# Patient Record
Sex: Male | Born: 1940 | Race: Black or African American | Hispanic: No | Marital: Married | Smoking: Former smoker
Health system: Southern US, Community
[De-identification: ages and names within clinical notes are randomized; demographics above are authoritative.]

## PROBLEM LIST (undated history)

## (undated) DIAGNOSIS — I340 Nonrheumatic mitral (valve) insufficiency: Secondary | ICD-10-CM

## (undated) DIAGNOSIS — Z9889 Other specified postprocedural states: Secondary | ICD-10-CM

## (undated) DIAGNOSIS — L910 Hypertrophic scar: Secondary | ICD-10-CM

## (undated) DIAGNOSIS — Z860101 Personal history of adenomatous and serrated colon polyps: Secondary | ICD-10-CM

## (undated) DIAGNOSIS — Z86010 Personal history of colonic polyps: Secondary | ICD-10-CM

## (undated) DIAGNOSIS — I10 Essential (primary) hypertension: Secondary | ICD-10-CM

## (undated) DIAGNOSIS — I519 Heart disease, unspecified: Secondary | ICD-10-CM

## (undated) DIAGNOSIS — E785 Hyperlipidemia, unspecified: Secondary | ICD-10-CM

## (undated) DIAGNOSIS — M169 Osteoarthritis of hip, unspecified: Secondary | ICD-10-CM

## (undated) DIAGNOSIS — I4819 Other persistent atrial fibrillation: Secondary | ICD-10-CM

## (undated) DIAGNOSIS — I428 Other cardiomyopathies: Secondary | ICD-10-CM

## (undated) HISTORY — DX: Heart disease, unspecified: I51.9

## (undated) HISTORY — DX: Other specified postprocedural states: Z98.890

## (undated) HISTORY — PX: HERNIA REPAIR: SHX51

## (undated) HISTORY — PX: FEMORAL HERNIA REPAIR: SHX632

## (undated) HISTORY — DX: Other persistent atrial fibrillation: I48.19

## (undated) HISTORY — DX: Essential (primary) hypertension: I10

## (undated) HISTORY — DX: Hypertrophic scar: L91.0

## (undated) HISTORY — DX: Personal history of colonic polyps: Z86.010

## (undated) HISTORY — PX: COLONOSCOPY: SHX174

## (undated) HISTORY — DX: Hyperlipidemia, unspecified: E78.5

## (undated) HISTORY — DX: Nonrheumatic mitral (valve) insufficiency: I34.0

## (undated) HISTORY — DX: Osteoarthritis of hip, unspecified: M16.9

## (undated) HISTORY — DX: Other cardiomyopathies: I42.8

## (undated) HISTORY — DX: Personal history of adenomatous and serrated colon polyps: Z86.0101

---

## 1999-12-23 ENCOUNTER — Other Ambulatory Visit: Admission: RE | Admit: 1999-12-23 | Discharge: 1999-12-23 | Payer: Self-pay | Admitting: Gastroenterology

## 1999-12-23 ENCOUNTER — Encounter (INDEPENDENT_AMBULATORY_CARE_PROVIDER_SITE_OTHER): Payer: Self-pay | Admitting: Specialist

## 2003-08-28 ENCOUNTER — Encounter (INDEPENDENT_AMBULATORY_CARE_PROVIDER_SITE_OTHER): Payer: Self-pay | Admitting: Gastroenterology

## 2005-09-08 ENCOUNTER — Ambulatory Visit: Payer: Self-pay | Admitting: Internal Medicine

## 2006-10-26 ENCOUNTER — Ambulatory Visit: Payer: Self-pay | Admitting: Cardiology

## 2006-10-26 ENCOUNTER — Inpatient Hospital Stay (HOSPITAL_COMMUNITY): Admission: EM | Admit: 2006-10-26 | Discharge: 2006-10-31 | Payer: Self-pay | Admitting: Internal Medicine

## 2006-10-26 ENCOUNTER — Ambulatory Visit: Payer: Self-pay | Admitting: Internal Medicine

## 2006-10-30 ENCOUNTER — Encounter: Payer: Self-pay | Admitting: Cardiology

## 2006-11-02 ENCOUNTER — Ambulatory Visit: Payer: Self-pay | Admitting: Cardiology

## 2006-11-02 LAB — CONVERTED CEMR LAB
INR: 5.7 (ref 0.9–2.0)
Prothrombin Time: 31 s

## 2006-11-08 ENCOUNTER — Ambulatory Visit: Payer: Self-pay | Admitting: *Deleted

## 2006-11-08 ENCOUNTER — Ambulatory Visit: Payer: Self-pay

## 2006-11-14 ENCOUNTER — Ambulatory Visit: Payer: Self-pay | Admitting: Cardiology

## 2006-11-17 ENCOUNTER — Ambulatory Visit: Payer: Self-pay | Admitting: Internal Medicine

## 2006-11-22 ENCOUNTER — Ambulatory Visit: Payer: Self-pay | Admitting: Cardiology

## 2006-11-29 ENCOUNTER — Ambulatory Visit: Payer: Self-pay | Admitting: Cardiology

## 2006-12-06 ENCOUNTER — Ambulatory Visit: Payer: Self-pay | Admitting: Cardiology

## 2006-12-13 ENCOUNTER — Ambulatory Visit: Payer: Self-pay | Admitting: Cardiology

## 2006-12-20 ENCOUNTER — Ambulatory Visit: Payer: Self-pay | Admitting: Cardiology

## 2006-12-26 ENCOUNTER — Ambulatory Visit: Payer: Self-pay | Admitting: Internal Medicine

## 2006-12-26 ENCOUNTER — Encounter: Payer: Self-pay | Admitting: Cardiology

## 2006-12-26 ENCOUNTER — Ambulatory Visit (HOSPITAL_COMMUNITY): Admission: RE | Admit: 2006-12-26 | Discharge: 2006-12-26 | Payer: Self-pay | Admitting: Cardiology

## 2006-12-28 ENCOUNTER — Ambulatory Visit: Payer: Self-pay | Admitting: Cardiology

## 2007-01-03 ENCOUNTER — Ambulatory Visit: Payer: Self-pay | Admitting: Cardiology

## 2007-01-11 ENCOUNTER — Ambulatory Visit: Payer: Self-pay | Admitting: Cardiology

## 2007-01-15 ENCOUNTER — Ambulatory Visit: Payer: Self-pay | Admitting: Cardiovascular Disease

## 2007-01-29 ENCOUNTER — Ambulatory Visit: Payer: Self-pay | Admitting: Cardiology

## 2007-02-13 ENCOUNTER — Ambulatory Visit: Payer: Self-pay | Admitting: Cardiology

## 2007-02-27 ENCOUNTER — Ambulatory Visit: Payer: Self-pay | Admitting: Cardiology

## 2007-03-06 ENCOUNTER — Ambulatory Visit: Payer: Self-pay | Admitting: Cardiology

## 2007-03-13 ENCOUNTER — Ambulatory Visit: Payer: Self-pay | Admitting: Cardiology

## 2007-03-20 ENCOUNTER — Ambulatory Visit: Payer: Self-pay | Admitting: Cardiology

## 2007-03-26 ENCOUNTER — Ambulatory Visit: Payer: Self-pay | Admitting: Cardiology

## 2007-04-02 ENCOUNTER — Ambulatory Visit: Payer: Self-pay | Admitting: Cardiology

## 2007-04-10 ENCOUNTER — Ambulatory Visit: Payer: Self-pay | Admitting: Cardiology

## 2007-04-24 ENCOUNTER — Ambulatory Visit: Payer: Self-pay | Admitting: Cardiology

## 2007-05-08 ENCOUNTER — Ambulatory Visit: Payer: Self-pay | Admitting: Internal Medicine

## 2007-05-22 ENCOUNTER — Ambulatory Visit: Payer: Self-pay | Admitting: Cardiovascular Disease

## 2007-06-05 ENCOUNTER — Ambulatory Visit: Payer: Self-pay | Admitting: Cardiology

## 2007-06-05 ENCOUNTER — Ambulatory Visit: Payer: Self-pay

## 2007-06-12 ENCOUNTER — Ambulatory Visit: Payer: Self-pay | Admitting: Cardiology

## 2007-06-16 ENCOUNTER — Inpatient Hospital Stay (HOSPITAL_COMMUNITY): Admission: RE | Admit: 2007-06-16 | Discharge: 2007-06-19 | Payer: Self-pay | Admitting: Cardiology

## 2007-06-16 ENCOUNTER — Ambulatory Visit: Payer: Self-pay | Admitting: Cardiology

## 2007-06-25 ENCOUNTER — Ambulatory Visit: Payer: Self-pay | Admitting: Cardiology

## 2007-07-14 ENCOUNTER — Encounter: Payer: Self-pay | Admitting: *Deleted

## 2007-07-14 DIAGNOSIS — M169 Osteoarthritis of hip, unspecified: Secondary | ICD-10-CM

## 2007-07-14 DIAGNOSIS — E785 Hyperlipidemia, unspecified: Secondary | ICD-10-CM

## 2007-07-14 DIAGNOSIS — I10 Essential (primary) hypertension: Secondary | ICD-10-CM

## 2007-07-14 DIAGNOSIS — I080 Rheumatic disorders of both mitral and aortic valves: Secondary | ICD-10-CM

## 2007-07-14 DIAGNOSIS — D126 Benign neoplasm of colon, unspecified: Secondary | ICD-10-CM | POA: Insufficient documentation

## 2007-07-14 DIAGNOSIS — M1610 Unilateral primary osteoarthritis, unspecified hip: Secondary | ICD-10-CM | POA: Insufficient documentation

## 2007-07-14 DIAGNOSIS — I4819 Other persistent atrial fibrillation: Secondary | ICD-10-CM

## 2007-07-16 ENCOUNTER — Ambulatory Visit: Payer: Self-pay | Admitting: Cardiovascular Disease

## 2007-07-24 ENCOUNTER — Ambulatory Visit: Payer: Self-pay | Admitting: Cardiology

## 2007-07-24 LAB — CONVERTED CEMR LAB
BUN: 7 mg/dL (ref 6–23)
CO2: 30 meq/L (ref 19–32)
Creatinine, Ser: 1 mg/dL (ref 0.4–1.5)
Glucose, Bld: 95 mg/dL (ref 70–99)
Potassium: 3.7 meq/L (ref 3.5–5.1)
Sodium: 137 meq/L (ref 135–145)

## 2007-08-13 ENCOUNTER — Ambulatory Visit: Payer: Self-pay | Admitting: Internal Medicine

## 2007-08-30 ENCOUNTER — Ambulatory Visit: Payer: Self-pay | Admitting: Cardiovascular Disease

## 2007-08-30 ENCOUNTER — Ambulatory Visit: Payer: Self-pay | Admitting: Cardiology

## 2007-09-11 ENCOUNTER — Ambulatory Visit: Payer: Self-pay | Admitting: Internal Medicine

## 2007-09-11 DIAGNOSIS — N63 Unspecified lump in unspecified breast: Secondary | ICD-10-CM | POA: Insufficient documentation

## 2007-09-12 ENCOUNTER — Encounter: Payer: Self-pay | Admitting: Internal Medicine

## 2007-09-27 ENCOUNTER — Ambulatory Visit: Payer: Self-pay | Admitting: Cardiology

## 2007-10-16 ENCOUNTER — Ambulatory Visit: Payer: Self-pay | Admitting: Cardiovascular Disease

## 2007-11-06 ENCOUNTER — Ambulatory Visit: Payer: Self-pay | Admitting: Cardiology

## 2007-12-03 ENCOUNTER — Ambulatory Visit: Payer: Self-pay | Admitting: Cardiology

## 2007-12-03 LAB — CONVERTED CEMR LAB
BUN: 8 mg/dL (ref 6–23)
Creatinine, Ser: 1 mg/dL (ref 0.4–1.5)
GFR calc Af Amer: 97 mL/min
Glucose, Bld: 86 mg/dL (ref 70–99)
Potassium: 4.2 meq/L (ref 3.5–5.1)

## 2007-12-06 ENCOUNTER — Ambulatory Visit: Payer: Self-pay | Admitting: Cardiology

## 2007-12-31 ENCOUNTER — Ambulatory Visit: Payer: Self-pay | Admitting: Cardiology

## 2008-01-28 ENCOUNTER — Ambulatory Visit: Payer: Self-pay | Admitting: Internal Medicine

## 2008-02-25 ENCOUNTER — Ambulatory Visit: Payer: Self-pay | Admitting: Internal Medicine

## 2008-03-24 ENCOUNTER — Ambulatory Visit: Payer: Self-pay | Admitting: Internal Medicine

## 2008-04-24 ENCOUNTER — Ambulatory Visit: Payer: Self-pay | Admitting: Cardiovascular Disease

## 2008-05-21 ENCOUNTER — Ambulatory Visit: Payer: Self-pay | Admitting: Cardiology

## 2008-06-06 ENCOUNTER — Ambulatory Visit: Payer: Self-pay | Admitting: Cardiology

## 2008-06-30 ENCOUNTER — Ambulatory Visit: Payer: Self-pay | Admitting: Cardiology

## 2008-07-18 ENCOUNTER — Ambulatory Visit: Payer: Self-pay | Admitting: Cardiology

## 2008-08-15 ENCOUNTER — Ambulatory Visit: Payer: Self-pay | Admitting: Internal Medicine

## 2008-09-12 ENCOUNTER — Ambulatory Visit: Payer: Self-pay | Admitting: Cardiology

## 2008-10-10 ENCOUNTER — Ambulatory Visit: Payer: Self-pay | Admitting: Cardiology

## 2008-11-07 ENCOUNTER — Ambulatory Visit: Payer: Self-pay | Admitting: Cardiology

## 2008-11-25 ENCOUNTER — Encounter: Payer: Self-pay | Admitting: *Deleted

## 2008-12-12 ENCOUNTER — Ambulatory Visit: Payer: Self-pay | Admitting: Internal Medicine

## 2008-12-12 LAB — CONVERTED CEMR LAB: POC INR: 2.7

## 2008-12-31 ENCOUNTER — Encounter: Payer: Self-pay | Admitting: *Deleted

## 2009-01-08 ENCOUNTER — Ambulatory Visit: Payer: Self-pay | Admitting: Cardiology

## 2009-01-27 ENCOUNTER — Telehealth: Payer: Self-pay | Admitting: Cardiology

## 2009-02-05 ENCOUNTER — Ambulatory Visit: Payer: Self-pay | Admitting: Internal Medicine

## 2009-02-05 LAB — CONVERTED CEMR LAB: POC INR: 1.8

## 2009-02-11 ENCOUNTER — Ambulatory Visit: Payer: Self-pay | Admitting: Internal Medicine

## 2009-02-11 DIAGNOSIS — Z860100 Personal history of colon polyps, unspecified: Secondary | ICD-10-CM | POA: Insufficient documentation

## 2009-02-11 DIAGNOSIS — R109 Unspecified abdominal pain: Secondary | ICD-10-CM | POA: Insufficient documentation

## 2009-02-11 LAB — CONVERTED CEMR LAB
Bilirubin Urine: NEGATIVE
Leukocytes, UA: NEGATIVE
Nitrite: NEGATIVE
pH: 5.5 (ref 5.0–8.0)

## 2009-02-24 ENCOUNTER — Encounter: Payer: Self-pay | Admitting: Internal Medicine

## 2009-02-24 ENCOUNTER — Ambulatory Visit: Payer: Self-pay | Admitting: Internal Medicine

## 2009-02-24 LAB — HM COLONOSCOPY: HM Colonoscopy: 2

## 2009-02-25 ENCOUNTER — Telehealth (INDEPENDENT_AMBULATORY_CARE_PROVIDER_SITE_OTHER): Payer: Self-pay | Admitting: *Deleted

## 2009-02-25 ENCOUNTER — Telehealth: Payer: Self-pay | Admitting: Internal Medicine

## 2009-02-26 ENCOUNTER — Encounter: Payer: Self-pay | Admitting: Internal Medicine

## 2009-03-04 ENCOUNTER — Ambulatory Visit: Payer: Self-pay | Admitting: Internal Medicine

## 2009-03-18 ENCOUNTER — Ambulatory Visit: Payer: Self-pay | Admitting: Cardiovascular Disease

## 2009-04-15 ENCOUNTER — Ambulatory Visit: Payer: Self-pay | Admitting: Cardiovascular Disease

## 2009-04-15 LAB — CONVERTED CEMR LAB: POC INR: 1.8

## 2009-04-23 ENCOUNTER — Encounter (INDEPENDENT_AMBULATORY_CARE_PROVIDER_SITE_OTHER): Payer: Self-pay | Admitting: *Deleted

## 2009-04-23 ENCOUNTER — Ambulatory Visit: Payer: Self-pay | Admitting: Internal Medicine

## 2009-04-23 LAB — CONVERTED CEMR LAB
ALT: 29 units/L (ref 0–53)
AST: 39 units/L — ABNORMAL HIGH (ref 0–37)
Albumin: 4.2 g/dL (ref 3.5–5.2)
Eosinophils Relative: 2.3 % (ref 0.0–5.0)
GFR calc non Af Amer: 96.07 mL/min (ref >60)
HCT: 44.8 % (ref 39.0–52.0)
Hemoglobin: 15.2 g/dL (ref 13.0–17.0)
Ketones, ur: NEGATIVE mg/dL
LDL Cholesterol: 109 mg/dL — ABNORMAL HIGH (ref 0–99)
Lymphs Abs: 2.9 10*3/uL (ref 0.7–4.0)
Monocytes Relative: 5.2 % (ref 3.0–12.0)
Neutro Abs: 3.7 10*3/uL (ref 1.4–7.7)
Potassium: 3.9 meq/L (ref 3.5–5.1)
Sodium: 134 meq/L — ABNORMAL LOW (ref 135–145)
Specific Gravity, Urine: >=1.03 (ref 1.000–1.030)
TSH: 0.98 microintl units/mL (ref 0.35–5.50)
Urine Glucose: NEGATIVE mg/dL
Urobilinogen, UA: 0.2 (ref 0.0–1.0)
VLDL: 18.8 mg/dL (ref 0.0–40.0)
WBC: 7.3 10*3/uL (ref 4.5–10.5)

## 2009-04-27 ENCOUNTER — Ambulatory Visit: Payer: Self-pay | Admitting: Internal Medicine

## 2009-04-27 DIAGNOSIS — M545 Low back pain: Secondary | ICD-10-CM

## 2009-05-06 ENCOUNTER — Ambulatory Visit: Payer: Self-pay | Admitting: Internal Medicine

## 2009-05-22 ENCOUNTER — Ambulatory Visit: Payer: Self-pay | Admitting: Internal Medicine

## 2009-06-12 ENCOUNTER — Ambulatory Visit: Payer: Self-pay | Admitting: Cardiology

## 2009-06-12 LAB — CONVERTED CEMR LAB: POC INR: 3.2

## 2009-07-15 ENCOUNTER — Ambulatory Visit: Payer: Self-pay | Admitting: Cardiology

## 2009-07-15 LAB — CONVERTED CEMR LAB: POC INR: 2

## 2009-07-22 ENCOUNTER — Encounter (INDEPENDENT_AMBULATORY_CARE_PROVIDER_SITE_OTHER): Payer: Self-pay | Admitting: *Deleted

## 2009-08-20 ENCOUNTER — Ambulatory Visit: Payer: Self-pay | Admitting: Cardiology

## 2009-08-31 ENCOUNTER — Ambulatory Visit: Payer: Self-pay | Admitting: Cardiology

## 2009-10-06 ENCOUNTER — Ambulatory Visit: Payer: Self-pay

## 2009-10-06 ENCOUNTER — Ambulatory Visit (HOSPITAL_COMMUNITY): Admission: RE | Admit: 2009-10-06 | Discharge: 2009-10-06 | Payer: Self-pay | Admitting: Cardiology

## 2009-10-06 ENCOUNTER — Encounter: Payer: Self-pay | Admitting: Cardiology

## 2009-10-06 ENCOUNTER — Ambulatory Visit: Payer: Self-pay | Admitting: Cardiology

## 2009-10-13 ENCOUNTER — Ambulatory Visit: Payer: Self-pay | Admitting: Cardiovascular Disease

## 2009-11-10 ENCOUNTER — Ambulatory Visit: Payer: Self-pay | Admitting: Cardiology

## 2009-12-03 ENCOUNTER — Telehealth (INDEPENDENT_AMBULATORY_CARE_PROVIDER_SITE_OTHER): Payer: Self-pay | Admitting: *Deleted

## 2009-12-08 ENCOUNTER — Encounter: Payer: Self-pay | Admitting: Cardiology

## 2009-12-09 ENCOUNTER — Ambulatory Visit: Payer: Self-pay | Admitting: Cardiology

## 2009-12-09 ENCOUNTER — Ambulatory Visit: Payer: Self-pay | Admitting: Internal Medicine

## 2009-12-09 DIAGNOSIS — I428 Other cardiomyopathies: Secondary | ICD-10-CM | POA: Insufficient documentation

## 2009-12-09 LAB — CONVERTED CEMR LAB: POC INR: 3.1

## 2009-12-30 ENCOUNTER — Telehealth: Payer: Self-pay | Admitting: Cardiology

## 2010-01-06 ENCOUNTER — Ambulatory Visit: Payer: Self-pay | Admitting: Cardiology

## 2010-01-20 ENCOUNTER — Ambulatory Visit: Payer: Self-pay | Admitting: Cardiology

## 2010-02-01 ENCOUNTER — Ambulatory Visit: Payer: Self-pay | Admitting: Internal Medicine

## 2010-02-17 ENCOUNTER — Ambulatory Visit: Payer: Self-pay | Admitting: Cardiology

## 2010-03-04 ENCOUNTER — Ambulatory Visit: Payer: Self-pay | Admitting: Cardiology

## 2010-03-04 ENCOUNTER — Telehealth: Payer: Self-pay | Admitting: Cardiology

## 2010-03-04 ENCOUNTER — Inpatient Hospital Stay (HOSPITAL_COMMUNITY): Admission: AD | Admit: 2010-03-04 | Discharge: 2010-03-07 | Payer: Self-pay | Admitting: Cardiology

## 2010-03-10 ENCOUNTER — Ambulatory Visit: Payer: Self-pay | Admitting: Cardiology

## 2010-03-10 LAB — CONVERTED CEMR LAB: POC INR: 3.7

## 2010-04-09 ENCOUNTER — Ambulatory Visit: Payer: Self-pay | Admitting: Cardiology

## 2010-04-09 LAB — CONVERTED CEMR LAB: POC INR: 1.8

## 2010-04-30 ENCOUNTER — Ambulatory Visit: Payer: Self-pay | Admitting: Cardiology

## 2010-04-30 LAB — CONVERTED CEMR LAB: POC INR: 2.4

## 2010-05-21 ENCOUNTER — Ambulatory Visit: Payer: Self-pay | Admitting: Cardiology

## 2010-05-21 LAB — CONVERTED CEMR LAB: POC INR: 3.1

## 2010-06-11 ENCOUNTER — Ambulatory Visit: Payer: Self-pay | Admitting: Internal Medicine

## 2010-06-11 LAB — CONVERTED CEMR LAB: POC INR: 2.8

## 2010-07-06 ENCOUNTER — Telehealth: Payer: Self-pay | Admitting: Internal Medicine

## 2010-07-09 ENCOUNTER — Ambulatory Visit: Admission: RE | Admit: 2010-07-09 | Discharge: 2010-07-09 | Payer: Self-pay | Source: Home / Self Care

## 2010-07-13 ENCOUNTER — Telehealth: Payer: Self-pay | Admitting: Internal Medicine

## 2010-07-29 NOTE — Miscellaneous (Signed)
Summary: ekg order  Clinical Lists Changes  Orders: Added new Service order of EKG w/ Interpretation (93000) - Signed

## 2010-07-29 NOTE — Medication Information (Signed)
Summary: rov/tm  Anticoagulant Therapy  Managed by: Cloyde Reams, RN, BSN Referring MD: [name] MD PCP: [name] MD Supervising MD: Myrtis Ser MD, Tinnie Gens Indication 1: Atrial Fibrillation (ICD-427.31) Lab Used: [facility] Site: [facility] INR POC 2.0 INR RANGE 2 - 3  Dietary changes: yes       Details: Pt ate some broccoli last night.  Health status changes: no    Bleeding/hemorrhagic complications: no    Recent/future hospitalizations: no    Any changes in medication regimen? no    Recent/future dental: no  Any missed doses?: no       Is patient compliant with meds? yes       Allergies (verified): No Known Drug Allergies  Anticoagulation Management History:      The patient is taking warfarin and comes in today for a routine follow up visit.  Positive risk factors for bleeding include an age of 39 years or older.  The bleeding index is 'intermediate risk'.  Positive CHADS2 values include History of HTN.  Negative CHADS2 values include Age > 14 years old.  The start date was 10/31/2006.  His last INR was 2.2 RATIO.  Anticoagulation responsible provider: Myrtis Ser MD, Tinnie Gens.  INR POC: 2.0.  Cuvette Lot#: [id number].  Exp: 09/2010.    Anticoagulation Management Assessment/Plan:      The patient's current anticoagulation dose is Warfarin sodium 5 mg  tabs: Take as directed per coumadin clinic.  The target INR is 2.0-3.0.  The next INR is due 08/20/2009.  Anticoagulation instructions were given to patient.  Results were reviewed/authorized by Cloyde Reams, RN, BSN.  He was notified by Cloyde Reams RN.         Prior Anticoagulation Instructions: INR 3.2  CONTINUE TO TAKE 1 TABLET EVERYDAY.  KEEP YOUR GREENS CONSISTENT!  RECHECK IN 4 WEEKS.  Current Anticoagulation Instructions: INR 2.0  Continue on same dosage 5mg  daily.  Recheck in 4 weeks.

## 2010-07-29 NOTE — Medication Information (Signed)
Summary: rov/ewj  Anticoagulant Therapy  Managed by: [name], PharmD Referring MD: [name] MD PCP: [name] MD Supervising MD: [name] MD, [name] Indication 1: Atrial Fibrillation (ICD-427.31) Lab Used: [name] Site: [facility] INR POC 2.6 INR RANGE 2 - 3  Dietary changes: no    Health status changes: no    Bleeding/hemorrhagic complications: no    Recent/future hospitalizations: no    Any changes in medication regimen? no    Recent/future dental: no  Any missed doses?: no       Is patient compliant with meds? yes       Current Medications (verified): 1)  Furosemide 40 Mg  Tabs (Furosemide) .... Take One Tablet Once Daily 2)  Simvastatin 40 Mg  Tabs (Simvastatin) .... Take One Tablet Once Daily 3)  Warfarin Sodium 5 Mg  Tabs (Warfarin Sodium) .... Take As Directed Per Coumadin Clinic 4)  Potassium Chloride Cr 10 Meq Cr-Caps (Potassium Chloride) .... Take One Tablet By Mouth Daily 5)  Tikosyn 500 Mcg Caps (Dofetilide) .... Take 1 By Mouth Q12 Hours 6)  Benazepril Hcl 10 Mg Tabs (Benazepril Hcl) .... One By Mouth Daily 7)  Metoprolol Tartrate 50 Mg Tabs (Metoprolol Tartrate) .... Take 1/2 Tablet Two Times A Day  Allergies (verified): No Known Drug Allergies  Anticoagulation Management History:      The patient is taking warfarin and comes in today for a routine follow up visit.  Positive risk factors for bleeding include an age of 68 years or older.  The bleeding index is 'intermediate risk'.  Positive CHADS2 values include History of HTN.  Negative CHADS2 values include Age > 44 years old.  The start date was 10/31/2006.  His last INR was 2.2 RATIO.  Anticoagulation responsible provider: [name] MD, [name].  INR POC: 2.6.  Cuvette Lot#: [id number].  Exp: 09/2010.    Anticoagulation Management Assessment/Plan:      The patient's current anticoagulation dose is Warfarin sodium 5 mg  tabs: Take as directed per coumadin clinic.  The target INR is  2.0-3.0.  The next INR is due 09/17/2009.  Anticoagulation instructions were given to patient.  Results were reviewed/authorized by [name], PharmD.  He was notified by [name], PharmD.         Prior Anticoagulation Instructions: INR 2.0  Continue on same dosage 5mg  daily.  Recheck in 4 weeks.    Current Anticoagulation Instructions: Continue same dosage: 5mg  daily.

## 2010-07-29 NOTE — Medication Information (Signed)
Summary: rov   Anticoagulant Therapy  Managed by: Weston Brass, PharmD Referring MD: [name] MD PCP: [name] MD Supervising MD: Patty Sermons, MD Indication 1: Atrial Fibrillation (ICD-427.31) Lab Used: [facility] Site: [facility] INR POC 2.8 INR RANGE 2 - 3  Dietary changes: no    Health status changes: no    Bleeding/hemorrhagic complications: no    Recent/future hospitalizations: no    Any changes in medication regimen? no    Recent/future dental: no  Any missed doses?: no       Is patient compliant with meds? yes       Allergies: No Known Drug Allergies  Anticoagulation Management History:      The patient is taking warfarin and comes in today for a routine follow up visit.  Positive risk factors for bleeding include an age of 70 years or older.  The bleeding index is 'intermediate risk'.  Positive CHADS2 values include History of HTN.  Negative CHADS2 values include Age > 34 years old.  The start date was 10/31/2006.  His last INR was 2.8.  Anticoagulation responsible provider: Patty Sermons, MD.  INR POC: 2.8.  Cuvette Lot#: [id number].  Exp: 07/2011.    Anticoagulation Management Assessment/Plan:      The patient's current anticoagulation dose is Warfarin sodium 5 mg  tabs: Take as directed per coumadin clinic.  The target INR is 2.0-3.0.  The next INR is due 08/06/2010.  Anticoagulation instructions were given to patient.  Results were reviewed/authorized by Weston Brass, PharmD.  He was notified by Stephannie Peters, PharmD Candidate .         Prior Anticoagulation Instructions: INR 2.8  Continue taking 5mg  (1 tablet) daily.  Follow up in 4 weeks on January 13th at 8AM.  Current Anticoagulation Instructions: INR 2.8  Coumadin 5 mg tablets - Continue 1 tablet every day

## 2010-07-29 NOTE — Medication Information (Signed)
Summary: rov/ln  Anticoagulant Therapy  Managed by: Bethena Midget, RN, BSN Referring MD: [name] MD PCP: [name] MD Supervising MD: Shirlee Latch MD, Dalton Indication 1: Atrial Fibrillation (ICD-427.31) Lab Used: [facility] Site: [facility] INR POC 3.4 INR RANGE 2 - 3  Dietary changes: no    Health status changes: no    Bleeding/hemorrhagic complications: no    Recent/future hospitalizations: yes       Details: Restarting Tikosyn on 03/04/10  Any changes in medication regimen? no    Recent/future dental: no  Any missed doses?: no       Is patient compliant with meds? yes      Comments: Saw Dr [name] today.   Allergies: No Known Drug Allergies  Anticoagulation Management History:      The patient is taking warfarin and comes in today for a routine follow up visit.  Positive risk factors for bleeding include an age of 68 years or older.  The bleeding index is 'intermediate risk'.  Positive CHADS2 values include History of HTN.  Negative CHADS2 values include Age > 78 years old.  The start date was 10/31/2006.  His last INR was 2.2 RATIO.  Anticoagulation responsible provider: Shirlee Latch MD, Dalton.  INR POC: 3.4.  Cuvette Lot#: [id number].  Exp: 03/2011.    Anticoagulation Management Assessment/Plan:      The patient's current anticoagulation dose is Warfarin sodium 5 mg  tabs: Take as directed per coumadin clinic.  The target INR is 2.0-3.0.  The next INR is due 03/10/2010.  Anticoagulation instructions were given to patient.  Results were reviewed/authorized by Bethena Midget, RN, BSN.  He was notified by Bethena Midget, RN, BSN.         Prior Anticoagulation Instructions: INR 2.9  Continue same dose of 1 tab daily.  Re-check in 4 weeks.  Current Anticoagulation Instructions: INR 3.4 Skip today's dose then resume 5mg s everyday. Recheck in 3 weeks.  Incorporate more green leafy veggies in diet.

## 2010-07-29 NOTE — Medication Information (Signed)
Summary: rov/sp   Anticoagulant Therapy  Managed by: Louann Sjogren, PharmD Referring MD: [name] MD PCP: [name] MD Supervising MD: Gala Romney MD,Daniel Indication 1: Atrial Fibrillation (ICD-427.31) Lab Used: [facility] Site: [facility] INR POC 2.8 INR RANGE 2 - 3  Dietary changes: no    Health status changes: no    Bleeding/hemorrhagic complications: no    Recent/future hospitalizations: no    Any changes in medication regimen? no    Recent/future dental: yes     Details: 2 weeks ago had two teeth extracted  Any missed doses?: no       Is patient compliant with meds? yes       Allergies: No Known Drug Allergies  Anticoagulation Management History:      The patient is taking warfarin and comes in today for a routine follow up visit.  Positive risk factors for bleeding include an age of 54 years or older.  The bleeding index is 'intermediate risk'.  Positive CHADS2 values include History of HTN.  Negative CHADS2 values include Age > 61 years old.  The start date was 10/31/2006.  His last INR was 2.2 RATIO and today's INR is 2.8.  Anticoagulation responsible Felissa Blouch: Bensimhon MD,Daniel.  INR POC: 2.8.  Exp: 04/2011.    Anticoagulation Management Assessment/Plan:      The patient's current anticoagulation dose is Warfarin sodium 5 mg  tabs: Take as directed per coumadin clinic.  The target INR is 2.0-3.0.  The next INR is due 07/09/2010.  Anticoagulation instructions were given to patient.  Results were reviewed/authorized by Louann Sjogren, PharmD.         Prior Anticoagulation Instructions: INR 3.1  Take 1/2 tablet today then resume same dose of 1 tablet every day.  Recheck INR in 3 weeks.   Current Anticoagulation Instructions: INR 2.8  Continue taking 5mg  (1 tablet) daily.  Follow up in 4 weeks on January 13th at 8AM.

## 2010-07-29 NOTE — Medication Information (Signed)
Summary: rov/tm  Anticoagulant Therapy  Managed by: Weston Brass, PharmD Referring MD: [name] MD PCP: [name] MD Supervising MD: Shirlee Latch MD, Maxim Bedel Indication 1: Atrial Fibrillation (ICD-427.31) Lab Used: [facility] Site: [facility] INR POC 2.9 INR RANGE 2 - 3  Dietary changes: no    Health status changes: no    Bleeding/hemorrhagic complications: no    Recent/future hospitalizations: no    Any changes in medication regimen? no    Recent/future dental: no  Any missed doses?: no       Is patient compliant with meds? yes       Allergies: No Known Drug Allergies  Anticoagulation Management History:      The patient is taking warfarin and comes in today for a routine follow up visit.  Positive risk factors for bleeding include an age of 70 years or older.  The bleeding index is 'intermediate risk'.  Positive CHADS2 values include History of HTN.  Negative CHADS2 values include Age > 36 years old.  The start date was 10/31/2006.  His last INR was 2.2 RATIO.  Anticoagulation responsible provider: Shirlee Latch MD, Arnav Cregg.  INR POC: 2.9.  Cuvette Lot#: [id number].  Exp: 03/2011.    Anticoagulation Management Assessment/Plan:      The patient's current anticoagulation dose is Warfarin sodium 5 mg  tabs: Take as directed per coumadin clinic.  The target INR is 2.0-3.0.  The next INR is due 02/17/2010.  Anticoagulation instructions were given to patient.  Results were reviewed/authorized by Weston Brass, PharmD.  He was notified by Dillard Cannon.         Prior Anticoagulation Instructions: INR 3.6 Skip today's dose then resume 5mg s everyday. Recheck in 2 weeks.   Current Anticoagulation Instructions: INR 2.9  Continue same dose of 1 tab daily.  Re-check in 4 weeks.

## 2010-07-29 NOTE — Progress Notes (Signed)
Summary: refill   Phone Note Refill Request Message from:  Patient on July 06, 2010 3:46 PM  Refills Requested: Medication #1:  WARFARIN SODIUM 5 MG  TABS Take as directed per coumadin clinic  Medication #2:  TIKOSYN 500 MCG CAPS take one capsule two times a day.  Medication #3:  POTASSIUM CHLORIDE CR 10 MEQ CR-CAPS Take one tablet by mouth daily Send to [facility] [phone]  Initial call taken by: Judie Grieve,  July 06, 2010 3:47 PM  Follow-up for Phone Call        Will route to [facility] and [facility] for refills on tikosyn and coumadin. RX for klor con was sent in. LMOM for pt. Marrion Coy, CNA  July 06, 2010 4:29 PM  Follow-up by: Marrion Coy, CNA,  July 06, 2010 4:29 PM    Prescriptions: TIKOSYN 500 MCG CAPS (DOFETILIDE) take one capsule two times a day  #180 x 1   Entered by:   Laurance Flatten CMA   Authorized by:   Laren Boom, MD, [facility]   Signed by:   Laurance Flatten CMA on 07/08/2010   Method used:   Electronically to        [facility]* (retail)             ,          Ph: [phone]       Fax: [phone]   RxID:   [id number] WARFARIN SODIUM 5 MG  TABS (WARFARIN SODIUM) Take as directed per coumadin clinic  #100 x 1   Entered by:   Weston Brass PharmD   Authorized by:   [name], MD, [facility]   Signed by:   Weston Brass PharmD on 07/07/2010   Method used:   Electronically to        [facility]* (retail)             ,          Ph: [phone]       Fax: [phone]   RxID:   [id number] WARFARIN SODIUM 5 MG  TABS (WARFARIN SODIUM) Take as directed per coumadin clinic  #100 x 3   Entered by:   Cloyde Reams RN   Authorized by:   [name], MD, [facility]   Signed by:   Cloyde Reams RN on 07/07/2010   Method used:   Faxed to ...       [facility] (mail-order)             , [place]         Ph: [phone]       Fax: [phone]   RxID:   [id number] POTASSIUM CHLORIDE CR 10 MEQ CR-CAPS (POTASSIUM CHLORIDE) Take one tablet by mouth  daily  #90 x 3   Entered by:   Marrion Coy, CNA   Authorized by:   [name], MD, [facility]   Signed by:   Marrion Coy, CNA on 07/06/2010   Method used:   Faxed to ...       [facility] (mail-order)             , [place]         Ph: [phone]       Fax: [phone]   RxID:   [id number]

## 2010-07-29 NOTE — Letter (Signed)
Summary: Appointment - Reschedule  [facility]     [place], [place]    Phone:   Fax:      July 22, 2009 MRN: [id number]   Edward Pittman [address] [place], [place]  [address]   Dear [name],   Due to a change in our office schedule, your appointment on  08-20-2009  at  3:15              must be changed.  It is very important that we reach you to reschedule this appointment. We look forward to participating in your health care needs. Please contact us at the number listed above at your earliest convenience to reschedule this appointment.     Sincerely,     Lorne Skeens  [facility] Scheduling Team

## 2010-07-29 NOTE — Assessment & Plan Note (Signed)
Summary: to discuss afib /saf  Medications Added METOPROLOL TARTRATE 50 MG TABS (METOPROLOL TARTRATE) 1 1/2 tablets twice a day      Allergies Added: NKDA  Visit Type:  Initial Consult Referring Provider:  Dr [name] Primary Provider:  [name] MD   History of Present Illness: Edward Pittman is a pleasant 70 yo AAM with a h/o persistent atrial fibrillation and nonischemic CM who presents today for EP consultation regarding his afib.  He reports initially being diagnosed with atrial fibrillation 5/08.  He was cardioverted but did not maintain sinus rhythm.  He was tried flecainide which did not maintain sinus rhythm.  He was rate controlled with metoprolol.  Subsequently, he was placed on tikosyn 12/08.  He reports good response with tikosyn initially.  Unfortunately, he has had increasing frequency and duration of atrial fibrillation despite tikosyn.  Recently, he has stopped tikosyn and has been in sustained afib for 3 months.  He reports having an irregular pulse as well as symtoms of fatigue.  He chest pain, shortness of breath, orthopnea, PND, lower extremity edema, dizziness, presyncope, syncope, or neurologic sequela. The patient is tolerating medications without difficulties and is otherwise without complaint today.   He is unaware of triggers or precipitant for his afib.  Current Medications (verified): 1)  Furosemide 40 Mg  Tabs (Furosemide) .... As Needed 2)  Simvastatin 40 Mg  Tabs (Simvastatin) .... Take One Tablet Once Daily 3)  Warfarin Sodium 5 Mg  Tabs (Warfarin Sodium) .... Take As Directed Per Coumadin Clinic 4)  Potassium Chloride Cr 10 Meq Cr-Caps (Potassium Chloride) .... Take One Tablet By Mouth Daily 5)  Benazepril Hcl 10 Mg Tabs (Benazepril Hcl) .... One By Mouth Daily 6)  Metoprolol Tartrate 50 Mg Tabs (Metoprolol Tartrate) .Marland Kitchen.. 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 Mother died at age 15 of a brain tumor. Father died of unknown cause at age 78, although patient thinks he may have had a GI  bleed. Family History of Colon Cancer: Uncle Family History of Diabetes: Sister x 2 No Family History of Coronary Disease Family History of Breast Cancer: Sister Family History of Prostate Cancer: Uncle  Social History: Reviewed history from 02/10/2009 and no changes required. He lives in [place] with his wife.  He works as an Psychologist, prison and probation services.  He was married for 18 years and then divorced and  then subsequently married and is currently married for 14 years to his second wife.  He has 2 daughters, 2 sons and 6 grandchildren.  He smoked off and on for several years, but quit 9 years ago.  He denies any alcohol.  He used to use marijuana frequently before the 1970s.   Review of Systems       All systems are reviewed and negative except as listed in the HPI.   Vital Signs:  Patient profile:   70 year old male Height:      74 inches Weight:      230 pounds Pulse rate:   103 / minute BP sitting:   122 / 84  (left arm)  Vitals Entered By: Laurance Flatten CMA (February 01, 2010 10:27 AM)  Physical Exam  General:  Well developed, well nourished, in no acute distress. Head:  normocephalic and atraumatic Eyes:  PERRLA/EOM intact; conjunctiva and lids normal. Mouth:  Teeth, gums and palate normal. Oral mucosa normal. Neck:  Neck supple, no JVD. No masses, thyromegaly or abnormal cervical nodes. Lungs:  Clear bilaterally to auscultation and percussion. Heart:  iRRR, no m/r/g Abdomen:   Bowel sounds positive; abdomen soft and non-tender without masses, organomegaly, or hernias noted. No hepatosplenomegaly. Msk:  Back normal, normal gait. Muscle strength and tone normal. Pulses:  pulses normal in all 4 extremities Extremities:  No clubbing or cyanosis. Neurologic:  Alert and oriented x 3. Skin:  Intact without lesions or rashes. Cervical Nodes:  no significant adenopathy Psych:  Normal affect.   Echocardiogram  Procedure date:  10/06/2009  Findings:       - Left ventricle: The cavity size was normal. Wall thickness was     increased in a pattern of mild LVH. Systolic function was mildly     reduced. The estimated ejection fraction was in the range of 45%     to 50%. Mild global hypokinesis. Features are consistent with a     pseudonormal left ventricular filling pattern, with concomitant     abnormal relaxation and increased filling pressure (grade 2     diastolic dysfunction).   - Aortic valve: There was no stenosis.   - Mitral valve: Mild regurgitation.   - Left atrium: The atrium was mildly dilated.   - Right ventricle: The cavity size was normal. Systolic function was     normal.   - Right atrium: The atrium was mildly dilated.   - Pulmonary arteries: PA peak pressure: 38mm Hg (S).   - Inferior vena cava: The vessel was normal in size; the     respirophasic diameter changes were in the normal range (= 50%);     findings are consistent with normal central venous pressure.   Impressions:    - Normal LV size with mild LV hypertrophy. EF mildly decreased,     45-50%. Mild global hypokinesis. Moderate diastolic dysfunction.     Normal RV size and systolic function. Mild pulmonary hypertension.      EKG  Procedure date:  02/01/2010  Findings:      afib, V rate 100 bpm, otherwise normal ekg  Impression & Recommendations:  Problem # 1:  ATRIAL FIBRILLATION, CHRONIC (ICD-427.31) The patient has moderately symptomatic persistent atrial fibrillation.  He  has failed medical therapy with flecainide and tikosyn.  Therapeutic strategies for afib including medicine and ablation were discussed in detail with the patient today. Risk, benefits, and alternatives to EP study and radiofrequency ablation for afib were also discussed in detail today.  We discussed rate vs rhythm control strategies.  He is aware that anticipated success from ablation would be 60-70% and would possibly require more than 1 procedure.  He wishes to further contemplate his options.  In the interim, he wishes to continue rate control and chronic anticoagulation with coumadin (His chads2 score is 2).  We will discussed pradaxa as an altenrative to coumadin and he wishes to continue coumadin. I would not recommend multaq given his depressed EF.  We will increase metoprolol to 75mg  two times a day today. He will contact my office if he wishes to proceed with ablation. He will continue to follow-up with Dr [name].  Problem # 2:  CARDIOMYOPATHY (ICD-425.4) stable, NYHA CLass I increase metoprolol to 75mg   two times a day for better rate control  Problem # 3:  HYPERTENSION (ICD-401.9) stable  Patient Instructions: 1)  Your physician recommends that you schedule a follow-up appointment as scheduled with Dr [name] 2)  Your physician has recommended you make the following change in your medication: increase Metoprolol to 50mg  1 1/2 tablets two times a day Prescriptions: METOPROLOL TARTRATE 50 MG TABS (METOPROLOL TARTRATE) 1 1/2 tablets twice a day  #90 x 6   Entered by:   Dennis Bast, RN, BSN   Authorized by:   Hillis Range, MD   Signed by:   Dennis Bast, RN, BSN on 02/01/2010   Method used:   Electronically to        [id number] (retail)       [address]       [place], [place]  [address]       Ph: [phone] or [phone]       Fax: [phone]   RxID:   [id number]

## 2010-07-29 NOTE — Medication Information (Signed)
Summary: rov/sp  Anticoagulant Therapy  Managed by: Bethena Midget, RN, BSN Referring MD: [name] MD PCP: [name] MD Supervising MD: Shirlee Latch MD, Randilyn Foisy Indication 1: Atrial Fibrillation (ICD-427.31) Lab Used: [facility] Site: [facility] INR POC 3.6 INR RANGE 2 - 3  Dietary changes: no    Health status changes: no    Bleeding/hemorrhagic complications: no    Recent/future hospitalizations: no    Any changes in medication regimen? no    Recent/future dental: no  Any missed doses?: no       Is patient compliant with meds? yes       Allergies: No Known Drug Allergies  Anticoagulation Management History:      The patient is taking warfarin and comes in today for a routine follow up visit.  Positive risk factors for bleeding include an age of 68 years or older.  The bleeding index is 'intermediate risk'.  Positive CHADS2 values include History of HTN.  Negative CHADS2 values include Age > 68 years old.  The start date was 10/31/2006.  His last INR was 2.2 RATIO.  Anticoagulation responsible provider: Shirlee Latch MD, Akaiya Touchette.  INR POC: 3.6.  Cuvette Lot#: [id number].  Exp: 02/2011.    Anticoagulation Management Assessment/Plan:      The patient's current anticoagulation dose is Warfarin sodium 5 mg  tabs: Take as directed per coumadin clinic.  The target INR is 2.0-3.0.  The next INR is due 01/20/2010.  Anticoagulation instructions were given to patient.  Results were reviewed/authorized by Bethena Midget, RN, BSN.  He was notified by Bethena Midget, RN, BSN.         Prior Anticoagulation Instructions: INR 3.1  Take 1/2 tablet today then resume same dose of 1 tablet daily.    Current Anticoagulation Instructions: INR 3.6 Skip today's dose then resume 5mg s everyday. Recheck in 2 weeks.

## 2010-07-29 NOTE — Medication Information (Signed)
Summary: rov/sp  Anticoagulant Therapy  Managed by: Weston Brass, PharmD Referring MD: [name] MD PCP: [name] MD Supervising MD: Gala Romney MD, Reuel Boom Indication 1: Atrial Fibrillation (ICD-427.31) Lab Used: [facility] Site: [facility] INR POC 3.1 INR RANGE 2 - 3  Dietary changes: yes       Details: has not had v8 juice in a week or two  Health status changes: no    Bleeding/hemorrhagic complications: no    Recent/future hospitalizations: no    Any changes in medication regimen? yes       Details: stopped tikosyn; appt scheduled with Dr. Johney Frame to discuss possible ablation   Recent/future dental: no  Any missed doses?: no       Is patient compliant with meds? yes       Allergies: No Known Drug Allergies  Anticoagulation Management History:      The patient is taking warfarin and comes in today for a routine follow up visit.  Positive risk factors for bleeding include an age of 70 years or older.  The bleeding index is 'intermediate risk'.  Positive CHADS2 values include History of HTN.  Negative CHADS2 values include Age > 4 years old.  The start date was 10/31/2006.  His last INR was 2.2 RATIO.  Anticoagulation responsible provider: Bensimhon MD, Reuel Boom.  INR POC: 3.1.  Cuvette Lot#: [id number].  Exp: 01/2011.    Anticoagulation Management Assessment/Plan:      The patient's current anticoagulation dose is Warfarin sodium 5 mg  tabs: Take as directed per coumadin clinic.  The target INR is 2.0-3.0.  The next INR is due 01/06/2010.  Anticoagulation instructions were given to patient.  Results were reviewed/authorized by Weston Brass, PharmD.  He was notified by Weston Brass PharmD.         Prior Anticoagulation Instructions: INR 2.4  Continue same dose of 1 tablet every day   Current Anticoagulation Instructions: INR 3.1  Take 1/2 tablet today then resume same dose of 1 tablet daily.

## 2010-07-29 NOTE — Medication Information (Signed)
Summary: rov/cb  Anticoagulant Therapy  Managed by: Weston Brass, PharmD Referring MD: [name] MD PCP: [name] MD Supervising MD: Shirlee Latch MD, Zamorah Ailes Indication 1: Atrial Fibrillation (ICD-427.31) Lab Used: [facility] Site: [facility] INR POC 2.4 INR RANGE 2 - 3  Dietary changes: no    Health status changes: no    Bleeding/hemorrhagic complications: no    Recent/future hospitalizations: no    Any changes in medication regimen? no    Recent/future dental: no  Any missed doses?: no       Is patient compliant with meds? yes       Allergies: No Known Drug Allergies  Anticoagulation Management History:      The patient is taking warfarin and comes in today for a routine follow up visit.  Positive risk factors for bleeding include an age of 70 years or older.  The bleeding index is 'intermediate risk'.  Positive CHADS2 values include History of HTN.  Negative CHADS2 values include Age > 76 years old.  The start date was 10/31/2006.  His last INR was 2.2 RATIO.  Anticoagulation responsible provider: Shirlee Latch MD, Amabel Stmarie.  INR POC: 2.4.  Cuvette Lot#: [id number].  Exp: 01/2011.    Anticoagulation Management Assessment/Plan:      The patient's current anticoagulation dose is Warfarin sodium 5 mg  tabs: Take as directed per coumadin clinic.  The target INR is 2.0-3.0.  The next INR is due 12/08/2009.  Anticoagulation instructions were given to patient.  Results were reviewed/authorized by Weston Brass, PharmD.  He was notified by Weston Brass PharmD.         Prior Anticoagulation Instructions: INR 2.2. Take 1 tablet daily.  Current Anticoagulation Instructions: INR 2.4  Continue same dose of 1 tablet every day

## 2010-07-29 NOTE — Miscellaneous (Signed)
  Clinical Lists Changes  Observations: Added new observation of ECHOINTERP:  - Left ventricle: The cavity size was normal. Wall thickness was       increased in a pattern of mild LVH. Systolic function was mildly       reduced. The estimated ejection fraction was in the range of 45%       to 50%. Mild global hypokinesis. Features are consistent with a       pseudonormal left ventricular filling pattern, with concomitant       abnormal relaxation and increased filling pressure (grade 2       diastolic dysfunction).     - Aortic valve: There was no stenosis.     - Mitral valve: Mild regurgitation.     - Left atrium: The atrium was mildly dilated.     - Right ventricle: The cavity size was normal. Systolic function was       normal.     - Right atrium: The atrium was mildly dilated.     - Pulmonary arteries: PA peak pressure: 38mm Hg (S).     - Inferior vena cava: The vessel was normal in size; the       respirophasic diameter changes were in the normal range (= 50%);       findings are consistent with normal central venous pressure.     Impressions:            - Normal LV size with mild LV hypertrophy. EF mildly decreased,       45-50%. Mild global hypokinesis. Moderate diastolic dysfunction.       Normal RV size and systolic function. Mild pulmonary hypertension. (10/06/2009 14:05)      Echocardiogram  Procedure date:  10/06/2009  Findings:       - Left ventricle: The cavity size was normal. Wall thickness was       increased in a pattern of mild LVH. Systolic function was mildly       reduced. The estimated ejection fraction was in the range of 45%       to 50%. Mild global hypokinesis. Features are consistent with a       pseudonormal left ventricular filling pattern, with concomitant       abnormal relaxation and increased filling pressure (grade 2       diastolic dysfunction).     - Aortic valve: There was no stenosis.     - Mitral valve: Mild regurgitation.     - Left  atrium: The atrium was mildly dilated.     - Right ventricle: The cavity size was normal. Systolic function was       normal.     - Right atrium: The atrium was mildly dilated.     - Pulmonary arteries: PA peak pressure: 38mm Hg (S).     - Inferior vena cava: The vessel was normal in size; the       respirophasic diameter changes were in the normal range (= 50%);       findings are consistent with normal central venous pressure.     Impressions:            - Normal LV size with mild LV hypertrophy. EF mildly decreased,       45-50%. Mild global hypokinesis. Moderate diastolic dysfunction.       Normal RV size and systolic function. Mild pulmonary hypertension.

## 2010-07-29 NOTE — Assessment & Plan Note (Signed)
Summary: per pt / stopped Tikosyn   pfh,rn  Medications Added TIKOSYN 500 MCG CAPS (DOFETILIDE) HOLD METOPROLOL TARTRATE 50 MG TABS (METOPROLOL TARTRATE) 1 twice a day      Allergies Added: NKDA   Visit Type:  Follow-up Primary Provider:  [name] MD  CC:  Atrial Fibrillation.  History of Present Illness: The patient presents for evaluation of atrial fibrillation. He ran out of his Tikosyn several days ago.  When we were able to contact him he said he did not want to restart this at this time. He knows he is back in atrial fibrillation. Is not sure whether it has been persistent. He feels it feels his heart being irregular but he has not been having any presyncope or syncope. He has not been having any chest pressure, neck or arm discomfort. He has no new shortness of breath, PND or orthopnea. He has been taking his Coumadin.  Current Medications (verified): 1)  Furosemide 40 Mg  Tabs (Furosemide) .... As Needed 2)  Simvastatin 40 Mg  Tabs (Simvastatin) .... Take One Tablet Once Daily 3)  Warfarin Sodium 5 Mg  Tabs (Warfarin Sodium) .... Take As Directed Per Coumadin Clinic 4)  Potassium Chloride Cr 10 Meq Cr-Caps (Potassium Chloride) .... Take One Tablet By Mouth Daily 5)  Tikosyn 500 Mcg Caps (Dofetilide) .... Hold 6)  Benazepril Hcl 10 Mg Tabs (Benazepril Hcl) .... One By Mouth Daily 7)  Metoprolol Tartrate 50 Mg Tabs (Metoprolol Tartrate) .... Take 1/2 Tablet Two Times A Day  Allergies (verified): No Known Drug Allergies  Past History:  Past Medical History: Reviewed history from 02/10/2009 and no changes required. 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 HERNIORRHAPHY, HX OF  (ICD-V45.89)  Review of Systems       As stated in the HPI and negative for all other systems.   Vital Signs:  Patient profile:   70 year old male Height:      74 inches Weight:      223 pounds BMI:     28.73 Pulse rate:   113 / minute BP sitting:   122 / 84  (right arm)  Vitals Entered By: Marrion Coy, CNA (December 09, 2009 8:31 AM)  Physical Exam  General:  Well developed, well nourished, in no acute distress. Head:  normocephalic and atraumatic Eyes:  PERRLA/EOM intact; conjunctiva and lids normal. Mouth:  Poor dentitionl. Oral mucosa normal. Neck:  Neck supple, no JVD. No masses, thyromegaly or abnormal cervical nodes. Chest Wall:  Keloids Lungs:  Clear bilaterally to auscultation and percussion. Abdomen:  Bowel sounds positive; abdomen soft and non-tender without masses, organomegaly, or hernias noted. No hepatosplenomegaly. Msk:  Back normal, normal gait. Muscle strength and tone normal. Extremities:  No clubbing or cyanosis. Neurologic:  Alert and oriented x 3. Skin:  Intact without lesions or rashes, multiple keloids Cervical Nodes:  no significant adenopathy Inguinal Nodes:  no significant adenopathy Psych:  Normal affect.   Detailed Cardiovascular Exam  Neck    Carotids: Carotids full and equal bilaterally without bruits.      Neck Veins: Normal, no JVD.    Heart    Inspection: no deformities or lifts noted.      Palpation: normal PMI with no thrills palpable.      Auscultation: irregular rate and rhythm,  S1, S2 without murmurs, rubs, gallops, or clicks.    Vascular    Abdominal Aorta: no palpable masses, pulsations, or audible bruits.      Femoral Pulses: normal femoral pulses bilaterally.      Pedal Pulses: normal pedal pulses bilaterally.      Radial Pulses: normal radial pulses bilaterally.      Peripheral Circulation: no clubbing, cyanosis, or edema noted with normal capillary refill.     EKG  Procedure date:  12/09/2009  Findings:      Atrial  fibrillation,rate 113, axis within normal limits, intervals within normal limits, no acute ST-T wave changes  Impression & Recommendations:  Problem # 1:  ATRIAL FIBRILLATION, CHRONIC (ICD-427.31) I spent a long time discussing the options. We could pursue rate control and anticoagulation. He does not want to be hospitalized to restart Tikosyn at this time. I do not think that Multaq is an option given his reduced ejection fraction. I will refer him to Dr. Johney Frame to discuss possible ablation. In the meantime I will increase his metoprolol to 50 mg b.i.d. Orders: EKG w/ Interpretation (93000) EP Referral (Cardiology EP Ref )  Problem # 2:  HYPERTENSION (ICD-401.9) His blood pressure is treated in the context of managing his fibrillation.  Problem # 3:  CARDIOMYOPATHY (ICD-425.4) He has a mildly reduced ejection fraction but is euvolemic. This is nonischemic.  Patient Instructions: 1)  Your physician recommends that you schedule a follow-up appointment in: 2 months with Dr [name] 2)  Your physician has recommended you make the following change in your medication: Increase Metoprolol 50 mg one twice a day 3)  You have been diagnosed with atrial fibrillation.  Atrial fibrillation is a condition in which one of the upper chambers of the heart has extra electrical cells causing it to beat very fast.  Please see the handout/brochure given to you today for further information. 4)  You have been referred to DR Allred for At Fib Prescriptions: METOPROLOL TARTRATE 50 MG TABS (METOPROLOL TARTRATE) 1 twice a day  #60 x 11   Entered by:   Charolotte Capuchin, RN   Authorized by:   [name], MD, [facility]   Signed by:   Charolotte Capuchin, RN on 12/09/2009   Method used:   Electronically to        [id number] (retail)       [address]       [place], [place]  [address]       Ph: [phone] or [phone]       Fax: [phone]   RxID:    [id number]

## 2010-07-29 NOTE — Progress Notes (Signed)
Summary: refill/Pt is out of medication   Phone Note Refill Request Message from:  Patient on March 04, 2010 8:30 AM  Refills Requested: Medication #1:  POTASSIUM CHLORIDE CR 10 MEQ CR-CAPS Take one tablet by mouth daily  Medication #2:  WARFARIN SODIUM 5 MG  TABS Take as directed per coumadin clinic Send to [facility]  [phone]  Initial call taken by: Judie Grieve,  March 04, 2010 8:30 AM    Prescriptions: WARFARIN SODIUM 5 MG  TABS (WARFARIN SODIUM) Take as directed per coumadin clinic  #40 x 3   Entered by:   Bethena Midget, RN, BSN   Authorized by:   [name], MD, [facility]   Signed by:   Bethena Midget, RN, BSN on 03/04/2010   Method used:   Electronically to        [id number]* (retail)       [address]       [place], [place]  [address]       Ph: [phone] or [phone]       Fax: [phone]   RxID:   [id number] POTASSIUM CHLORIDE CR 10 MEQ CR-CAPS (POTASSIUM CHLORIDE) Take one tablet by mouth daily  #30 x 12   Entered by:   Marrion Coy, CNA   Authorized by:   [name], MD, [facility]   Signed by:   Marrion Coy, CNA on 03/04/2010   Method used:   Electronically to        [id number] (retail)       [address]       [place], [place]  [address]       Ph: [phone] or [phone]       Fax: [phone]   RxID:   [id number]

## 2010-07-29 NOTE — Medication Information (Signed)
Summary: rov/sl   Anticoagulant Therapy  Managed by: Weston Brass, PharmD Referring MD: [name] MD PCP: [name] MD Supervising MD: Diona Browner MD, Remi Deter Indication 1: Atrial Fibrillation (ICD-427.31) Lab Used: [facility]  Site: [facility] INR POC 3.1 INR RANGE 2 - 3  Dietary changes: yes       Details: ate cranberry sauce yesterday  Health status changes: no    Bleeding/hemorrhagic complications: no    Recent/future hospitalizations: no    Any changes in medication regimen? no    Recent/future dental: no  Any missed doses?: no       Is patient compliant with meds? yes       Allergies: No Known Drug Allergies  Anticoagulation Management History:      The patient is taking warfarin and comes in today for a routine follow up visit.  Positive risk factors for bleeding include an age of 70 years or older.  The bleeding index is 'intermediate risk'.  Positive CHADS2 values include History of HTN.  Negative CHADS2 values include Age > 24 years old.  The start date was 10/31/2006.  His last INR was 2.2 RATIO.  Anticoagulation responsible provider: Diona Browner MD, Remi Deter.  INR POC: 3.1.  Exp: 04/2011.    Anticoagulation Management Assessment/Plan:      The patient's current anticoagulation dose is Warfarin sodium 5 mg  tabs: Take as directed per coumadin clinic.  The target INR is 2.0-3.0.  The next INR is due 06/11/2010.  Anticoagulation instructions were given to patient.  Results were reviewed/authorized by Weston Brass, PharmD.  He was notified by Weston Brass PharmD.         Prior Anticoagulation Instructions: INR 2.4  Take Coumadin 1 tab (5 mg) every day. Return to clinic in 3 weeks.   Current Anticoagulation Instructions: INR 3.1  Take 1/2 tablet today then resume same dose of 1 tablet every day.  Recheck INR in 3 weeks.

## 2010-07-29 NOTE — Assessment & Plan Note (Signed)
Summary: 6 month rov/sl  Medications Added FUROSEMIDE 40 MG  TABS (FUROSEMIDE) as needed      Allergies Added: NKDA  Visit Type:  Follow-up Primary Provider:  [name] MD  CC:  Atrial Fibrillation.  History of Present Illness: The patient presents for follow up of the above.  Since I last saw him he continues to feel occasional palpitations. These might happen once every few weeks. They may last for up to 2 hours. He does not describe associated presyncope or syncope. He does not have any new shortness of breath and denies any PND or orthopnea. He has had no chest, neck or arm discomfort. He has had no weight gain or swelling. He remains physically active with his job.  Current Medications (verified): 1)  Furosemide 40 Mg  Tabs (Furosemide) .... As Needed 2)  Simvastatin 40 Mg  Tabs (Simvastatin) .... Take One Tablet Once Daily 3)  Warfarin Sodium 5 Mg  Tabs (Warfarin Sodium) .... Take As Directed Per Coumadin Clinic 4)  Potassium Chloride Cr 10 Meq Cr-Caps (Potassium Chloride) .... Take One Tablet By Mouth Daily 5)  Tikosyn 500 Mcg Caps (Dofetilide) .... Take 1 By Mouth Q12 Hours 6)  Benazepril Hcl 10 Mg Tabs (Benazepril Hcl) .... One By Mouth Daily 7)  Metoprolol Tartrate 50 Mg Tabs (Metoprolol Tartrate) .... Take 1/2 Tablet Two Times A Day  Allergies (verified): No Known Drug Allergies  Past History:  Past Medical History: Reviewed history from 02/10/2009 and no changes required. 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 HERNIORRHAPHY, HX OF (ICD-V45.89)  Review of Systems       As stated in the HPI and negative for all other systems.   Vital Signs:  Patient profile:    70 year old male Height:      74 inches Weight:      226 pounds BMI:     29.12 Pulse rate:   58 / minute Resp:     16 per minute BP sitting:   112 / 70  (right arm)  Vitals Entered By: Marrion Coy, CNA (August 31, 2009 2:24 PM)  Physical Exam  General:  Well developed, well nourished, in no acute distress. Head:  normocephalic and atraumatic Eyes:  PERRLA/EOM intact; conjunctiva and lids normal. Mouth:  Teeth, gums and palate normal. Oral mucosa normal. Neck:  Neck supple, no JVD. No masses, thyromegaly or abnormal cervical nodes. Chest Wall:  no deformities and no tenderness, keloids   Lungs:  Clear bilaterally to auscultation and percussion. Abdomen:  Bowel sounds positive; abdomen soft and non-tender without masses, organomegaly, or hernias noted. No hepatosplenomegaly. Msk:  Back normal, normal gait. Muscle strength and tone normal. Extremities:  No clubbing or cyanosis. Neurologic:  Alert and oriented x 3. Skin:  Intact without lesions or rashes. Cervical Nodes:  no significant adenopathy Axillary Nodes:  no significant adenopathy Inguinal Nodes:  no significant adenopathy Psych:  Normal affect.   Detailed Cardiovascular Exam  Neck    Carotids: Carotids full and equal bilaterally without bruits.      Neck Veins: Normal, no JVD.    Heart    Inspection: no deformities or lifts noted.      Palpation: normal PMI with no thrills palpable.      Auscultation: regular rate and rhythm, S1, S2 without  murmurs, rubs, gallops, or clicks.    Vascular    Abdominal Aorta: no palpable masses, pulsations, or audible bruits.      Femoral Pulses: normal femoral pulses bilaterally.      Pedal Pulses: normal pedal pulses bilaterally.      Radial Pulses: normal radial pulses bilaterally.      Peripheral Circulation: no clubbing, cyanosis, or edema noted with normal capillary refill.     EKG  Procedure date:  08/31/2009  Findings:      sinus rhythm, rate 58, axis within normal  limits, intervals within normal limits, no acute ST-T wave changes.  Impression & Recommendations:  Problem # 1:  COUMADIN THERAPY (ICD-V58.61) I plan on repeating an echocardiogram. If he now has a normal LV function I think it would be most prudent to discontinue Coumadin as he is CHADS/Vasc score would be low.  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 Orders: Echocardiogram (Echo)  Problem # 3:  HYPERTENSION (ICD-401.9) His blood pressure is controlled and he will continue the meds as above.  Problem # 4:  ATRIAL FIBRILLATION, CHRONIC (ICD-427.31) His fibrillation is controlled with Tikosyn.  He will continue with this medication. Orders: EKG w/ Interpretation (93000) Echocardiogram (Echo)  Patient Instructions: 1)  Your physician recommends that you schedule a follow-up appointment in: 6 months  with Dr [name] 2)  Your physician recommends that you continue on your current medications as directed. Please refer to the Current Medication list given to you today. 3)  You have been diagnosed with atrial fibrillation.  Atrial fibrillation is a condition in which one of the upper chambers of the heart has extra electrical cells causing it to beat very fast.  Please see the handout/brochure given to you today for further information. 4)  Your physician has requested that you have an echocardiogram.  Echocardiography is a painless test that uses sound waves to create images of your heart. It provides your doctor with information about the size and shape of your heart and how well your heart's chambers and valves are working.  This procedure takes approximately one hour. There are no restrictions for this procedure.

## 2010-07-29 NOTE — Assessment & Plan Note (Signed)
Summary: PER CHECK OUT/SF      Allergies Added: NKDA  Visit Type:  Follow-up Referring Destinie Thornsberry:  Dr [name] Primary Teaghan Formica:  [name] MD  CC:  Atrial Fibrillation.  History of Present Illness: The patient presents for followup of atrial fibrillation. I sent him to see Dr.Allred to discuss possible atrial fibrillation ablation. This was offered but the patient deferred wanting to think further about it. His big concern is possibly a complication potentially requiring a pacemaker or other. He says he does feel the fibrillation and doesn't think he feels as well in this rhythm. He feels his heart skipping. He has more fatigue. He denies any presyncope or syncope. He's not having any new shortness of breath, PND or orthopnea. He has not had any new chest pressure, neck or arm discomfort. He has had no weight gain or swelling.  Current Medications (verified): 1)  Furosemide 40 Mg  Tabs (Furosemide) .... As Needed 2)  Simvastatin 40 Mg  Tabs (Simvastatin) .... Take One Tablet Once Daily 3)  Warfarin Sodium 5 Mg  Tabs (Warfarin Sodium) .... Take As Directed Per Coumadin Clinic 4)  Potassium Chloride Cr 10 Meq Cr-Caps (Potassium Chloride) .... Take One Tablet By Mouth Daily 5)  Benazepril Hcl 10 Mg Tabs (Benazepril Hcl) .... One By Mouth Daily 6)  Metoprolol Tartrate 50 Mg Tabs (Metoprolol Tartrate) .Marland Kitchen.. 1 1/2 Tablets Twice A Day  Allergies (verified): No Known Drug Allergies  Past History:  Past Medical History: Reviewed history from 02/01/2010 and no changes required. 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 HERNIORRHAPHY, HX OF at age 12  Review of Systems       As  stated in the HPI and negative for all other systems.   Vital Signs:  Patient profile:   70 year old male Height:      74 inches Weight:      230 pounds BMI:     29.64 Pulse rate:   77 / minute Resp:     16 per minute BP sitting:   128 / 90  (left arm)  Vitals Entered By: Marrion Coy, CNA (February 17, 2010 8:53 AM)  Physical Exam  General:  Well developed, well nourished, in no acute distress. Head:  normocephalic and atraumatic Eyes:  PERRLA/EOM intact; conjunctiva and lids normal. Mouth:  Poor dentition, gums and palate normal. Oral mucosa normal. Neck:  Neck supple, no JVD. No masses, thyromegaly or abnormal cervical nodes. Chest Wall:  no deformities or breast masses noted Lungs:  Clear bilaterally to auscultation and percussion. Abdomen:  Bowel sounds positive; abdomen soft and non-tender without masses, organomegaly, or hernias noted. No hepatosplenomegaly. Msk:  Back normal, normal gait. Muscle strength and tone normal. Extremities:  No clubbing or cyanosis. Neurologic:  Alert and oriented x 3. Skin:  Intact without lesions or rashes. Cervical Nodes:  no significant adenopathy Axillary Nodes:  no significant adenopathy Inguinal Nodes:  no significant adenopathy Psych:  Normal affect.   Detailed Cardiovascular Exam  Neck    Carotids: Carotids full and equal bilaterally without bruits.      Neck Veins: Normal, no JVD.    Heart    Inspection: no deformities or lifts noted.      Palpation: normal PMI with no thrills palpable.  Auscultation: regular rate and rhythm, S1, S2 without murmurs, rubs, gallops, or clicks.    Vascular    Abdominal Aorta: no palpable masses, pulsations, or audible bruits.      Femoral Pulses: normal femoral pulses bilaterally.      Pedal Pulses: normal pedal pulses bilaterally.      Radial Pulses: normal radial pulses bilaterally.      Peripheral Circulation: no clubbing, cyanosis, or edema noted with normal capillary refill.      Impression & Recommendations:  Problem # 1:  ATRIAL FIBRILLATION, CHRONIC (ICD-427.31) The patient will have his Coumadin checked today. At this point he would like to restart Tikosyn.  He had no complications with this but stopped it on his own.  We will plan elective admission to resume Tikosyn in early September.  Problem # 2:  HYPERTENSION (ICD-401.9) His blood pressure is controlled. He will continue the meds as listed.  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 I will follow this clinically.  Patient Instructions: 1)  Your physician recommends that you schedule a follow-up appointment after hospitalization for Tikosyn 2)  Your physician recommends that you continue on your current medications as directed. Please refer to the Current Medication list given to you today. 3)  You will be scheduled for admission for Tikosyn load.  You will contact with the date and time.

## 2010-07-29 NOTE — Medication Information (Signed)
Summary: rov/sp   Anticoagulant Therapy  Managed by: Reina Fuse, PharmD Referring MD: [name] MD PCP: [name] MD Supervising MD: Myrtis Ser MD, Tinnie Gens Indication 1: Atrial Fibrillation (ICD-427.31) Lab Used: [facility] Site: [facility] INR POC 2.4 INR RANGE 2 - 3  Dietary changes: yes       Details: Has eaten a few more leafy greens than normal.  Health status changes: no    Bleeding/hemorrhagic complications: no    Recent/future hospitalizations: no    Any changes in medication regimen? no    Recent/future dental: no  Any missed doses?: no       Is patient compliant with meds? yes      Comments: Pt has been taking 1 whole tab (5 mg) every day (instead of 0.5 tab one day per week) to make up for extra leafy green intake.   Allergies: No Known Drug Allergies  Anticoagulation Management History:      The patient is taking warfarin and comes in today for a routine follow up visit.  Positive risk factors for bleeding include an age of 68 years or older.  The bleeding index is 'intermediate risk'.  Positive CHADS2 values include History of HTN.  Negative CHADS2 values include Age > 42 years old.  The start date was 10/31/2006.  His last INR was 2.2 RATIO.  Anticoagulation responsible Elainna Eshleman: Myrtis Ser MD, Tinnie Gens.  INR POC: 2.4.  Cuvette Lot#: [id number].  Exp: 04/2011.    Anticoagulation Management Assessment/Plan:      The patient's current anticoagulation dose is Warfarin sodium 5 mg  tabs: Take as directed per coumadin clinic.  The target INR is 2.0-3.0.  The next INR is due 05/21/2010.  Anticoagulation instructions were given to patient.  Results were reviewed/authorized by Reina Fuse, PharmD.  He was notified by Reina Fuse PharmD.         Prior Anticoagulation Instructions: INR 1.8  Take 1 1/2 tablets today then resume same dose of 1 tablet every day except 1/2 tablet on Thursday.  Recheck INR in 3 weeks.   Current Anticoagulation Instructions: INR 2.4  Take  Coumadin 1 tab (5 mg) every day. Return to clinic in 3 weeks.

## 2010-07-29 NOTE — Progress Notes (Signed)
Summary: pt needs tykosin   Phone Note Refill Request Call back at [phone]  Message from:  Pharmacy on [facility]  Refills Requested: Medication #1:  TIKOSYN 500 MCG CAPS take one capsule two times a day. ref# [id number] [email or web address] get form and fax to   Initial call taken by: Omer Jack,  July 13, 2010 3:13 PM

## 2010-07-29 NOTE — Medication Information (Signed)
Summary: rov/tm   Anticoagulant Therapy  Managed by: Weston Brass, PharmD Referring MD: [name] MD PCP: [name] MD Supervising MD: Myrtis Ser MD, Tinnie Gens Indication 1: Atrial Fibrillation (ICD-427.31) Lab Used: [facility] Site: [facility] INR POC 3.7 INR RANGE 2 - 3  Dietary changes: yes       Details: Possibly had less greens since out of the hospital  Health status changes: no    Bleeding/hemorrhagic complications: no    Recent/future hospitalizations: no    Any changes in medication regimen? yes       Details: Went in hospital to restart Tikosyn   Recent/future dental: no  Any missed doses?: no       Is patient compliant with meds? yes       Allergies: No Known Drug Allergies  Anticoagulation Management History:      The patient is taking warfarin and comes in today for a routine follow up visit.  Positive risk factors for bleeding include an age of 69 years or older.  The bleeding index is 'intermediate risk'.  Positive CHADS2 values include History of HTN.  Negative CHADS2 values include Age > 109 years old.  The start date was 10/31/2006.  His last INR was 2.2 RATIO.  Anticoagulation responsible Emiliya Chretien: Myrtis Ser MD, Tinnie Gens.  INR POC: 3.7.  Cuvette Lot#: [id number].  Exp: 03/2011.    Anticoagulation Management Assessment/Plan:      The patient's current anticoagulation dose is Warfarin sodium 5 mg  tabs: Take as directed per coumadin clinic.  The target INR is 2.0-3.0.  The next INR is due 03/30/2010.  Anticoagulation instructions were given to patient.  Results were reviewed/authorized by Weston Brass, PharmD.  He was notified by Harrel Carina, PharmD candidate.         Prior Anticoagulation Instructions: INR 3.4 Skip today's dose then resume 5mg s everyday. Recheck in 3 weeks.  Incorporate more green leafy veggies in diet.   Current Anticoagulation Instructions: INR 3.7  Do not take any Coumadin today. Then take 1 tablet everyday except take 1/2 tablet on  Thursdays. Re-check INR in 3 weeks.

## 2010-07-29 NOTE — Assessment & Plan Note (Signed)
Summary: ROV/D.MILLER      Allergies Added: NKDA  Visit Type:  Follow-up Primary Edward Pittman:  [name] MD  CC:  Atrial Fibrillation.  History of Present Illness: The patient presents after hospitalization for Tikosyn loading and cardioversion. He has since done well with few episodes of tachycardia palpitations that he believes to be atrial fibrillation. He has been less symptomatic. He denies any chest discomfort, neck or arm discomfort. He has had no shortness of breath, PND or orthopnea. He has no weight gain or edema. He tolerates Coumadin.  Cardiovascular Risk History:      Positive major cardiovascular risk factors include male age 68 years old or older, hyperlipidemia, and hypertension.  Negative major cardiovascular risk factors include non-tobacco-user status.    Current Medications (verified): 1)  Furosemide 40 Mg  Tabs (Furosemide) .... As Needed 2)  Simvastatin 40 Mg  Tabs (Simvastatin) .... Take One Tablet Once Daily 3)  Warfarin Sodium 5 Mg  Tabs (Warfarin Sodium) .... Take As Directed Per Coumadin Clinic 4)  Potassium Chloride Cr 10 Meq Cr-Caps (Potassium Chloride) .... Take One Tablet By Mouth Daily 5)  Benazepril Hcl 10 Mg Tabs (Benazepril Hcl) .... One By Mouth Daily 6)  Metoprolol Tartrate 50 Mg Tabs (Metoprolol Tartrate) .Marland Kitchen.. 1 1/2 Tablets Twice A Day 7)  Tikosyn 500 Mcg Caps (Dofetilide) .... Take One Capsule Two Times A Day  Allergies (verified): No Known Drug Allergies  Past History:  Past Medical History: Reviewed history from 02/01/2010 and no changes required. 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 HERNIORRHAPHY, HX OF at age 73  Review  of Systems       As stated in the HPI and negative for all other systems.   Vital Signs:  Patient profile:   70 year old male Height:      74 inches Weight:      232 pounds Pulse rate:   57 / minute BP sitting:   110 / 80  (left arm)  Vitals Entered By: Laurance Flatten CMA (May 21, 2010 8:54 AM)  Physical Exam  General:  Well developed, well nourished, in no acute distress. Head:  normocephalic and atraumatic Eyes:  PERRLA/EOM intact; conjunctiva and lids normal. Neck:  Neck supple, no JVD. No masses, thyromegaly or abnormal cervical nodes. Chest Wall:  no deformities  Lungs:  Clear bilaterally to auscultation and percussion. Heart:  Non-displaced PMI, chest non-tender; regular rate and rhythm, S1, S2 without murmurs, rubs or gallops. Carotid upstroke normal, no bruit. Normal abdominal aortic size, no bruits. Femorals normal pulses, no bruits. Pedals normal pulses. No edema, no varicosities. Abdomen:  Bowel sounds positive; abdomen soft and non-tender without masses, organomegaly, or hernias noted. No hepatosplenomegaly. Msk:  Back normal, normal gait. Muscle strength and tone normal. Extremities:  No clubbing or cyanosis. Neurologic:  Alert and oriented x 3. Skin:  Intact without lesions or rashes. Cervical Nodes:  no significant adenopathy   EKG  Procedure date:  05/21/2010  Findings:      Sinus rhythm, rate 57, axis within normal limits, intervals within normal limits, no acute ST-T wave changes  Impression & Recommendations:  Problem # 1:  ATRIAL FIBRILLATION, CHRONIC (ICD-427.31) He is maintaing NSR better than previously.  At this point no further change in therapy or testing is indicated.  We discussed Pradaxa.  However, coumadin seems to be cheaper.  Given the reduced EF, HTN,  and MR I think that his thromboembolic risk continues to favor the use of coumadin.  He very much wants to continue with this understanding the risk benefits discussion.  Problem # 2:   CARDIOMYOPATHY (ICD-425.4) He is euvolemic and will continue meds as listed.  Problem # 3:  HYPERTENSION (ICD-401.9) His blood pressure is controlled and he will continue meds as listed.  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  Cardiovascular Risk Assessment/Plan:      The patient's hypertensive risk group is category B: At least one risk factor (excluding diabetes) with no target organ damage.  His calculated 10 year risk of coronary heart disease is 22 %.  Today's blood pressure is 110/80.     Patient Instructions: 1)  Your physician recommends that you schedule a follow-up appointment in: 6 months with Dr [name] 2)  Your physician recommends that you continue on your current medications as directed. Please refer to the Current Medication list given to you today.

## 2010-07-29 NOTE — Progress Notes (Signed)
Summary: CALL RE MEDICATION(LMTCB)   Phone Note Call from Patient   Caller: Patient Reason for Call: Talk to Nurse Summary of Call: PT Edward Pittman RE MEDICATION-PLS CALL [phone] Initial call taken by: Glynda Jaeger,  December 30, 2009 2:46 PM  Follow-up for Phone Call        Left message to call back Deliah Goody, RN  December 30, 2009 3:26 PM     Prescriptions: POTASSIUM CHLORIDE CR 10 MEQ CR-CAPS (POTASSIUM CHLORIDE) Take one tablet by mouth daily  #30 x 12   Entered by:   Deliah Goody, RN   Authorized by:   [name], MD, [facility]   Signed by:   Deliah Goody, RN on 12/30/2009   Method used:   Electronically to        [facility] [address] [id number] (retail)       [address]       [place], [place]  [address]       Ph: [phone] or [phone]       Fax: [phone]   RxID:   [id number] METOPROLOL TARTRATE 50 MG TABS (METOPROLOL TARTRATE) 1 twice a day  #60 x 11   Entered by:   Deliah Goody, RN   Authorized by:   [name], MD, [facility]   Signed by:   Deliah Goody, RN on 12/30/2009   Method used:   Electronically to        [id number]* (retail)       [address]       [place], [place]  [address]       Ph: [phone] or [phone]       Fax: [phone]   RxID:   [id number]

## 2010-07-29 NOTE — Medication Information (Signed)
Summary: rov/sp  Anticoagulant Therapy  Managed by: Elaina Pattee, PharmD Referring MD: [name] MD PCP: [name] MD Supervising MD: Excell Seltzer MD, Casimiro Needle Indication 1: Atrial Fibrillation (ICD-427.31) Lab Used: [facility] Site: [facility] INR RANGE 2 - 3  Dietary changes: yes       Details: May have eaten fewer greens, but did eat more last week.  Health status changes: no    Bleeding/hemorrhagic complications: no    Recent/future hospitalizations: no    Any changes in medication regimen? no    Recent/future dental: no  Any missed doses?: no       Is patient compliant with meds? yes       Allergies: No Known Drug Allergies  Anticoagulation Management History:      The patient is taking warfarin and comes in today for a routine follow up visit.  Positive risk factors for bleeding include an age of 68 years or older.  The bleeding index is 'intermediate risk'.  Positive CHADS2 values include History of HTN.  Negative CHADS2 values include Age > 1 years old.  The start date was 10/31/2006.  His last INR was 2.2 RATIO.  Anticoagulation responsible provider: Excell Seltzer MD, Casimiro Needle.  Cuvette Lot#: [id number].  Exp: 11/2010.    Anticoagulation Management Assessment/Plan:      The patient's current anticoagulation dose is Warfarin sodium 5 mg  tabs: Take as directed per coumadin clinic.  The target INR is 2.0-3.0.  The next INR is due 11/10/2009.  Anticoagulation instructions were given to patient.  Results were reviewed/authorized by Elaina Pattee, PharmD.  He was notified by Elaina Pattee, PharmD.         Prior Anticoagulation Instructions: Continue same dosage: 5mg  daily.  Current Anticoagulation Instructions: INR 2.2. Take 1 tablet daily.

## 2010-07-29 NOTE — Medication Information (Signed)
Summary: ccr   Anticoagulant Therapy  Managed by: Weston Brass, PharmD Referring MD: [name] MD PCP: [name] MD Supervising MD: Myrtis Ser MD, Tinnie Gens Indication 1: Atrial Fibrillation (ICD-427.31) Lab Used: [facility] Site: [facility] INR POC 1.8 INR RANGE 2 - 3  Dietary changes: yes       Details: ate extra greens  Health status changes: no    Bleeding/hemorrhagic complications: no    Recent/future hospitalizations: no    Any changes in medication regimen? no    Recent/future dental: no  Any missed doses?: no       Is patient compliant with meds? yes       Allergies: No Known Drug Allergies  Anticoagulation Management History:      The patient is taking warfarin and comes in today for a routine follow up visit.  Positive risk factors for bleeding include an age of 68 years or older.  The bleeding index is 'intermediate risk'.  Positive CHADS2 values include History of HTN.  Negative CHADS2 values include Age > 68 years old.  The start date was 10/31/2006.  His last INR was 2.2 RATIO.  Anticoagulation responsible provider: Myrtis Ser MD, Tinnie Gens.  INR POC: 1.8.  Cuvette Lot#: [id number].  Exp: 04/2011.    Anticoagulation Management Assessment/Plan:      The patient's current anticoagulation dose is Warfarin sodium 5 mg  tabs: Take as directed per coumadin clinic.  The target INR is 2.0-3.0.  The next INR is due 04/30/2010.  Anticoagulation instructions were given to patient.  Results were reviewed/authorized by Weston Brass, PharmD.  He was notified by Weston Brass PharmD.         Prior Anticoagulation Instructions: INR 3.7  Do not take any Coumadin today. Then take 1 tablet everyday except take 1/2 tablet on Thursdays. Re-check INR in 3 weeks.   Current Anticoagulation Instructions: INR 1.8  Take 1 1/2 tablets today then resume same dose of 1 tablet every day except 1/2 tablet on Thursday.  Recheck INR in 3 weeks.

## 2010-07-29 NOTE — Progress Notes (Signed)
Phone Note From Pharmacy   Caller: Riki Rusk Call For: Refill questions  Summary of Call: A pharmacist from [facility] called regarding pts Tikosyn.  They are not showing hx of refill since 01/2009.  Pt has not requested refill by phone nor has any other pharmacy called [place] regarding this medication since that time.  According to pts 08/31/2009 office note, he was still taking this medication.  Due to need for hospitalization for titration pharmacy was concerned about refilling.  Tried to contact pt at home and work number. LMOM at both.  Pt to call back.  Will then need to contact [phone]   REF# [id number]/ Either confirm or deny refill.   Initial call taken by: Judithe Modest CMA,  December 03, 2009 2:49 PM  Follow-up for Phone Call        Pt called back yesterday evening and confirmed that he has been missing doses of Tikosyn. Pt admits going for a couple days and not taking it and then taking it when he remembered at the evening dose.  He finds it hard to remember the morning dose.  Pt does not want to continue Tikosyn and wants to speak with Dr. Lindaann Slough nurse regarding medication change.  Forward to [facility]. Follow-up by: Judithe Modest CMA,  December 03, 2009 2:43 PM  Additional Follow-up for Phone Call Additional follow up Details #1::        Does not want to restart Tikosyn, wants to know if there is anything else he needs to take.  Will ask Dr [name] Additional Follow-up by: Charolotte Capuchin, RN,  December 04, 2009 2:55 PM    Prescriptions: BENAZEPRIL HCL 10 MG TABS (BENAZEPRIL HCL) one by mouth daily  #30 x 1   Entered by:   Charolotte Capuchin, RN   Authorized by:   [name], MD, [facility]   Signed by:   Charolotte Capuchin, RN on 12/04/2009   Method used:   Electronically to        [id number] (retail)       [address]       [place], [place]  [address]       Ph: [phone] or [phone]       Fax: [phone]   RxID:    [id number] SIMVASTATIN 40 MG  TABS (SIMVASTATIN) Take one tablet once daily  #30 x 1   Entered by:   Charolotte Capuchin, RN   Authorized by:   [name], MD, [facility]   Signed by:   Charolotte Capuchin, RN on 12/04/2009   Method used:   Electronically to        [id number]* (retail)       [address]       [place], [place]  [address]       Ph: [phone] or [phone]       Fax: [phone]   RxID:   [id number] BENAZEPRIL HCL 10 MG TABS (BENAZEPRIL HCL) one by mouth daily  #90 x 3   Entered by:   Charolotte Capuchin, RN   Authorized by:   [name], MD, [facility]   Signed by:   Charolotte Capuchin, RN on 12/04/2009   Method used:   Electronically to        [facility]* (mail-order)             ,  Ph: [phone]       Fax: [phone]   RxID:   [id number] SIMVASTATIN 40 MG  TABS (SIMVASTATIN) Take one tablet once daily  #90 x 3   Entered by:   Charolotte Capuchin, RN   Authorized by:   [name], MD, [facility]   Signed by:   Charolotte Capuchin, RN on 12/04/2009   Method used:   Electronically to        [facility]* (mail-order)             ,          Ph: [phone]       Fax: [phone]   RxID:   [id number]

## 2010-08-06 ENCOUNTER — Encounter: Payer: Self-pay | Admitting: Cardiology

## 2010-08-06 ENCOUNTER — Encounter (INDEPENDENT_AMBULATORY_CARE_PROVIDER_SITE_OTHER): Payer: BC Managed Care – PPO

## 2010-08-06 DIAGNOSIS — I4891 Unspecified atrial fibrillation: Secondary | ICD-10-CM

## 2010-08-06 DIAGNOSIS — Z7901 Long term (current) use of anticoagulants: Secondary | ICD-10-CM

## 2010-08-06 LAB — CONVERTED CEMR LAB: POC INR: 2.4

## 2010-08-12 NOTE — Medication Information (Signed)
Summary: Coumadin Clinic   Anticoagulant Therapy  Managed by: Weston Brass, PharmD Referring MD: [name] MD PCP: [name] MD Supervising MD: Jens Som MD, Arlys Glenford Indication 1: Atrial Fibrillation (ICD-427.31) Lab Used: [facility] Site: [facility] INR POC 2.4 INR RANGE 2 - 3  Dietary changes: no    Health status changes: no    Bleeding/hemorrhagic complications: no    Recent/future hospitalizations: no    Any changes in medication regimen? no    Recent/future dental: no  Any missed doses?: no       Is patient compliant with meds? yes       Allergies: No Known Drug Allergies  Anticoagulation Management History:      The patient is taking warfarin and comes in today for a routine follow up visit.  Positive risk factors for bleeding include an age of 68 years or older.  The bleeding index is 'intermediate risk'.  Positive CHADS2 values include History of HTN.  Negative CHADS2 values include Age > 68 years old.  The start date was 10/31/2006.  His last INR was 2.8.  Anticoagulation responsible provider: Jens Som MD, Arlys Tykel.  INR POC: 2.4.  Cuvette Lot#: [id number].  Exp: 06/2011.    Anticoagulation Management Assessment/Plan:      The patient's current anticoagulation dose is Warfarin sodium 5 mg  tabs: Take as directed per coumadin clinic.  The target INR is 2.0-3.0.  The next INR is due 09/03/2010.  Anticoagulation instructions were given to patient.  Results were reviewed/authorized by Weston Brass, PharmD.  He was notified by Margot Chimes PharmD Candidate.         Prior Anticoagulation Instructions: INR 2.8  Coumadin 5 mg tablets - Continue 1 tablet every day   Current Anticoagulation Instructions: INR 2.4   Continue current Coumadin dose of 1 tablet everyday.  Recheck in 4 weeks.

## 2010-08-31 ENCOUNTER — Encounter: Payer: Self-pay | Admitting: Cardiology

## 2010-08-31 DIAGNOSIS — I4891 Unspecified atrial fibrillation: Secondary | ICD-10-CM

## 2010-09-02 ENCOUNTER — Encounter (INDEPENDENT_AMBULATORY_CARE_PROVIDER_SITE_OTHER): Payer: BC Managed Care – PPO

## 2010-09-02 ENCOUNTER — Encounter: Payer: Self-pay | Admitting: Cardiology

## 2010-09-02 DIAGNOSIS — Z7901 Long term (current) use of anticoagulants: Secondary | ICD-10-CM

## 2010-09-02 DIAGNOSIS — I4891 Unspecified atrial fibrillation: Secondary | ICD-10-CM

## 2010-09-07 NOTE — Medication Information (Signed)
Summary: rov/ewj      Allergies Added: NKDA Anticoagulant Therapy  Managed by: Samantha Crimes, PharmD Referring MD: [name] MD PCP: [name] MD Supervising MD: Myrtis Ser MD, Tinnie Gens Indication 1: Atrial Fibrillation (ICD-427.31) Lab Used: [facility] Site: [facility] INR POC 2 INR RANGE 2 - 3  Dietary changes: no    Health status changes: no    Bleeding/hemorrhagic complications: no    Recent/future hospitalizations: no    Any changes in medication regimen? no    Recent/future dental: no  Any missed doses?: no       Is patient compliant with meds? yes       Current Medications (verified): 1)  Furosemide 40 Mg  Tabs (Furosemide) .... As Needed 2)  Simvastatin 40 Mg  Tabs (Simvastatin) .... Take One Tablet Once Daily 3)  Warfarin Sodium 5 Mg  Tabs (Warfarin Sodium) .... Take As Directed Per Coumadin Clinic 4)  Potassium Chloride Cr 10 Meq Cr-Caps (Potassium Chloride) .... Take One Tablet By Mouth Daily 5)  Benazepril Hcl 10 Mg Tabs (Benazepril Hcl) .... One By Mouth Daily 6)  Metoprolol Tartrate 50 Mg Tabs (Metoprolol Tartrate) .Marland Kitchen.. 1 1/2 Tablets Twice A Day 7)  Tikosyn 500 Mcg Caps (Dofetilide) .... Take One Capsule Two Times A Day  Allergies (verified): No Known Drug Allergies  Anticoagulation Management History:      Positive risk factors for bleeding include an age of 68 years or older.  The bleeding index is 'intermediate risk'.  Positive CHADS2 values include History of HTN.  Negative CHADS2 values include Age > 68 years old.  The start date was 10/31/2006.  His last INR was 2.8.  Anticoagulation responsible provider: Myrtis Ser MD, Tinnie Gens.  INR POC: 2.  Exp: 06/2011.    Anticoagulation Management Assessment/Plan:      The patient's current anticoagulation dose is Warfarin sodium 5 mg  tabs: Take as directed per coumadin clinic.  The target INR is 2.0-3.0.  The next INR is due 09/30/2010.  Anticoagulation instructions were given to patient.  Results were  reviewed/authorized by Samantha Crimes, PharmD.         Prior Anticoagulation Instructions: INR 2.4   Continue current Coumadin dose of 1 tablet everyday.  Recheck in 4 weeks.    Current Anticoagulation Instructions: Cont with current regimen Return to clinic in 4 weeks

## 2010-09-09 LAB — BASIC METABOLIC PANEL
BUN: 13 mg/dL (ref 6–23)
CO2: 26 mEq/L (ref 19–32)
Chloride: 107 mEq/L (ref 96–112)
Creatinine, Ser: 0.95 mg/dL (ref 0.4–1.5)
Glucose, Bld: 102 mg/dL — ABNORMAL HIGH (ref 70–99)
Potassium: 4.1 mEq/L (ref 3.5–5.1)

## 2010-09-09 LAB — PROTIME-INR
INR: 2.63 — ABNORMAL HIGH (ref 0.00–1.49)
INR: 2.64 — ABNORMAL HIGH (ref 0.00–1.49)
INR: 2.79 — ABNORMAL HIGH (ref 0.00–1.49)
Prothrombin Time: 28.2 seconds — ABNORMAL HIGH (ref 11.6–15.2)
Prothrombin Time: 28.3 seconds — ABNORMAL HIGH (ref 11.6–15.2)

## 2010-09-09 LAB — MAGNESIUM: Magnesium: 2.1 mg/dL (ref 1.5–2.5)

## 2010-09-30 ENCOUNTER — Ambulatory Visit (INDEPENDENT_AMBULATORY_CARE_PROVIDER_SITE_OTHER): Payer: BC Managed Care – PPO | Admitting: *Deleted

## 2010-09-30 DIAGNOSIS — I4891 Unspecified atrial fibrillation: Secondary | ICD-10-CM

## 2010-09-30 LAB — POCT INR: INR: 3

## 2010-10-28 ENCOUNTER — Ambulatory Visit (INDEPENDENT_AMBULATORY_CARE_PROVIDER_SITE_OTHER): Payer: BC Managed Care – PPO | Admitting: *Deleted

## 2010-10-28 DIAGNOSIS — I4891 Unspecified atrial fibrillation: Secondary | ICD-10-CM

## 2010-10-28 LAB — POCT INR: INR: 2.1

## 2010-11-09 NOTE — H&P (Signed)
NAME:  Edward Pittman, Edward Pittman NO.:  [id number]   MEDICAL RECORD NO.:  [id number]          PATIENT TYPE:  INP   LOCATION:  3701                         FACILITY:  [facility]   PHYSICIAN:  Rosalyn Gess. Norins, MD  DATE OF BIRTH:  [birth year]   DATE OF ADMISSION:  10/26/2006  DATE OF DISCHARGE:                              HISTORY & PHYSICAL   CHIEF COMPLAINT:  Shortness of breath.   HISTORY OF THE PRESENT ILLNESS:  Mr. No is a 70 year old African-  American gentleman who has not had routine care in my office since 2005.  He was seen September 08, 2005 for a problem with hip pain plus DJD.   The patient reports he has had problems over the past several months  actually with some mild shortness of breath getting worse so that now he  is awakened in the early morning hours with shortness of breath.  He  also has some mild shortness of breath during the day.  The patient  reports that he has had a longstanding history of some left pressure in  his chest but does now report that chest pressure is more intense and  lasting longer.  It happens at rest and with exertion.  The patient, in  initial evaluation in the office, was found to be markedly hypertensive  and to have an irregular irregular heart rate, which on EKG showed him  to have atrial fibrillation with a rapid ventricular response.   Cardiac risk factors:  Male gender, a question of positive family  history with sudden death in his father, hyperlipidemia with last LDL in  2005 at 73, a former smoker.  The patient is now admitted to control  his atrial fibrillation, to rule out for MI.   PLAN:  1. Telemetry admission.  2. Cardiac enzymes x3.  3. Laboratory to include TSH, CMET, fasting lipid panel.   MEDICATIONS:  1. Aspirin 325 mg at admission.  2. Lovenox 40 mg subcu q.12.  3. Cardizem drip 10 mg per hour, no loading bolus.  4. Lopressor 25 mg b.i.d.   1. Studies will include a chest x-ray.  Based on results,  the patient      may be a candidate for additional diagnostic study.  If his cardiac      enzymes are negative and his atrial fibrillation is under rate      control, I may consider outpatient evaluation with stress testing.      May consider inpatient cardiology consultation.  The patient will      probably be anticoagulated, but for now we will just go with      aspirin and Lovenox.  2. Hyperlipidemia.  The patient will be started on Zocor 40 mg daily.  3. 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  Plan is chest x-      ray as noted.  We will check an O2 sat.  May consider outpatient      PFTs after cardiac evaluation is completed and he  is stabilized.   PAST MEDICAL HISTORY:  Surgical:  Hernia repair at age 69.  No other  surgeries noted.  Medical:  The patient had the usual childhood diseases.  The patient has  had colon polyps in the past.   HABITS:  The patient was a former smoker but quit approximately 9 years  ago, uses no alcohol.   FAMILY HISTORY:  Father died at age 30 of unknown causes.  Mother died  at age 67 of brain tumor.  There are 5 sisters and 4 brothers with no  known history of other members of the family with coronary artery  disease.  One uncle with colon cancer, a sister with breast cancer, a  brother with lung cancer--and he was a smoker, an uncle with prostate  cancer, 2 sisters with diabetes.   SOCIAL HISTORY:  The patient was married 18 years and divorced.  He has  been married 14 years with present wife.  He has 2 daughters, 2 sons, 6  grandchildren.  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   REVIEW OF SYSTEMS:  Is negative for any constitutional, cardiovascular,  respiratory, GI or GU problems except as noted.   PHYSICAL EXAM:  VITAL SIGNS:  Temperature was 99.3, blood pressure  164/108, heart rate was 130, weight is 226.  GENERAL APPEARANCE:  This  is a heavy-set African-American gentleman who  is in no acute distress.  HEENT EXAM:  Grossly normal with no abnormalities noted.  Oropharynx  without lesions.  NECK:  Supple.  There was no thyromegaly.  CHEST:  Clear with no rales, wheezes, or rhonchi.  CARDIOVASCULAR:  2+ radial pulses with highly-irregular precordium, no  JVD, no carotid bruits.  He had an irregular irregular rhythm at a rapid  rate.  ABDOMEN:  Soft.  No guarding or rebound was noted.  RECTAL/GENITALIA EXAM:  Deferred.  EXTREMITIES:  Without clubbing, cyanosis, edema, deformity.  NEUROLOGIC EXAM:  Nonfocal.   PLAN:  As above with the patient being admitted to telemetry and to be  ruled out, lipids to be treated.      Rosalyn Gess Norins, MD  Electronically Signed     MEN/MEDQ  D:  10/26/2006  T:  10/27/2006  Job:  [id number]

## 2010-11-09 NOTE — Assessment & Plan Note (Signed)
[facility]                            CARDIOLOGY OFFICE NOTE   Edward Pittman, Edward Pittman                     MRN:          [id number]  DATE:11/14/2006                            DOB:          May 17, 1941    PRIMARY CARE PHYSICIAN:  Dr. Rosalyn Gess. Norins.   REASON FOR VISIT:  Evaluate patient with atrial fibrillation with rapid  ventricular response.   HISTORY OF PRESENT ILLNESS:  The patient was hospitalized from May 1 to  May 6 with atrial fibrillation and rapid ventricular response. He also  had some evidence of pulmonary edema. He did have attempt at  cardioversion after rate control failed to keep his rhythm reasonable.  However, he did not convert to sinus rhythm. The plan then was to  discharge him for an ischemia workup followed by flecainide if he was  found to have a normal heart. He has had is Coumadin followed in our  clinic. At first it was supratherapeutic with a level above 5. On  repeat, it was 1.9. he is having this followed closely. He did have an  echocardiogram before discharge which demonstrated that his EF was in  the low normal range. There were no significant abnormalities. His  Cardiolite as an outpatient demonstrated no evidence of ischemia or  infarct. The ejection fraction and Cardiolite was underestimated.   The patient says he is noticing palpitations only when he lies down at  night. It is much better than when he came into the hospital. He is not  having any of the shortness of breath that he had at that time. He is  not having any chest pressure of neck discomfort. He has had some  axillary chest discomfort that he has had for years. It has been a  stable pattern. It is very atypical. He cannot bring it on. It is not  severe. There are no associated symptoms. He notices that he cannot put  something in the pocket on that side because it irritates his chest. He  denies any new shortness of breath, PND or orthopnea. He  has had no  swelling.   PAST MEDICAL HISTORY:  Persistent atrial fibrillation, questionable  sleep apnea, dyslipidemia, heart failure, well preserved ejection  fraction (related to the atrial fibrillation), remote tobacco use  quitting 9 years ago, hypertension, history of hernia repair at age 26,  degenerative joint disease with hip pain, keloid formation.   ALLERGIES:  None.   MEDICATIONS:  1. Furosemide 40 mg daily.  2. Cartia 100 mg daily.  3. Digoxin 0.125 mg daily.  4. Metoprolol 100 mg b.i.d.  5. Simvastatin 40 mg daily.  6. Coumadin per our Coumadin clinic.   REVIEW OF SYSTEMS:  As stated in the HPI. Negative for other systems.   PHYSICAL EXAMINATION:  GENERAL:  The patient is in no distress.  VITAL SIGNS:  Blood pressure 115/72, heart rate 77 and irregular, weight  217 pounds, body mass index 29.  HEENT:  Eyelids unremarkable. Pupils equal round and reactive to light.  Fundi not visualized. Oral mucosa unremarkable.  NECK:  No jugular venous distention.  Wave form within normal limits.  Carotid upstroke brisk and symmetric, no bruits, no thyromegaly.  LYMPHATICS:  No cervical, axillary or inguinal adenopathy.  LUNGS:  Clear to auscultation bilaterally.  BACK:  No costovertebral angle tenderness.  CHEST:  Unremarkable.  HEART:  PMI not displaced or sustained. S1 and S2 within normal limits.  No S3, no murmurs.  ABDOMEN:  Flat, positive bowel sounds, normal to frequency and pitch. No  bruits, no rebound, no guarding, no midline pulsatile mass, no  hepatomegaly, no splenomegaly.  SKIN:  No rashes, no nodules.  EXTREMITIES:  2+ pulses, no edema, no cyanosis, no clubbing.  NEUROLOGIC:  Oriented to person, place and time. Cranial nerves II-XII  grossly intact. Motor grossly intact.   ASSESSMENT/PLAN:  1. Atrial fibrillation. I do plan to try to get him in sinus rhythm as      rate control has been difficult. He is going to remain on Coumadin.      I think he needs 4  weeks of a therapeutic INR before I can attempt      cardioversion. We are alerting our Coumadin clinic to this. This      means that I cannot start flecainide for 4 weeks. However, given      the fact that he had a negative stress perfusion study and a well-      preserved ejection fraction on echo, flecainide would be a      reasonable choice eventually. I will start with 15 mg b.i.d. and      then increase to 100 mg b.i.d. He will probably need Cardioversion      following this. For now, he will remain on the medications as      listed. I have outlined this plan with the patient.  2. Pulmonary edema (heart failure) with a well-preserved ejection      fraction. He will remain on the Lasix.  3. Followup. We will see the patient back in about 6 weeks or at the      time of Cardioversion.     [name], MD, [facility]  Electronically Signed    JH/MedQ  DD: 11/14/2006  DT: 11/14/2006  Job #: [id number]   cc:   Rosalyn Gess. Norins, MD

## 2010-11-09 NOTE — Assessment & Plan Note (Signed)
[facility]                            CARDIOLOGY OFFICE NOTE   Edward Pittman, Edward Pittman                     MRN:          [id number]  DATE:12/20/2006                            DOB:          May 28, 1941    PRIMARY CARE PHYSICIAN:  Rosalyn Gess. Norins, M.D.   REASON FOR PRESENTATION:  Evaluate patient with atrial fibrillation with  rapid ventricular response.   HISTORY OF PRESENT ILLNESS:  Patient presents now for followup of this.  He has had symptomatic atrial fibrillation that has been difficult to  control.  The plan then has been to try to maintain sinus rhythm.  I  think this is the appropriate maneuver, since he was symptomatic and had  difficult-to-control rates.  He has had a stress perfusion study, which  demonstrated no evidence of ischemia or infarct.  His EF was not  adequately estimated, but it did have a followup echocardiogram,  demonstrating his EF to be 55%.  There were no significant abnormalities  precluding the use of a Class I-C agent.  We then waited for him to have  four weeks of therapeutic Coumadin level above 2.  When this happened  last week, we initiated flecainide 50 mg twice a day.  I have insured  that he is on beta blocker and calcium channel blocker.  He comes back  today to have a repeat EKG.  As described below, there are no  abnormalities on this with no interval increase of his QRS or QT.  He  has had no presyncope or syncope.  He feels well.  He feels the  palpitations at night.  He does not have any chest discomfort.  He  denies any shortness of breath.  He remains active.   PAST MEDICAL HISTORY:  Persistent atrial fibrillation, questionable  sleep apnea, dyslipidemia, heart failure with a well-preserved ejection  fraction (related to his atrial fibrillation), remote tobacco use,  quitting nine years ago; hypertension, history of a hernia repair at age  [age], degenerative joint disease, hip pain, keloid  formation.   ALLERGIES:  None.   MEDICATIONS:  1. Furosemide 40 mg daily.  2. Cartia 120 mg daily.  3. Digoxin 125 micrograms daily.  4. Metoprolol 100 mg b.i.d.  5. Simvastatin 40 mg daily.  6. Coumadin.  7. Flecainide 50 mg b.i.d.   REVIEW OF SYSTEMS:  As stated in the HPI, and otherwise negative for  other systems.   PHYSICAL EXAMINATION:  The patient is in no distress.  Blood pressure  120/79, heart rate 60 and irregular, body mass index 29.  HEENT:  Eyelids unremarkable.  Pupils equal, round and reactive to  light.  Fundi not visualized.  Oral mucosa unremarkable.  NECK:  No jugular venous distention at 45 degrees.  Carotid upstroke  brisk and symmetric.  No bruits, no thyromegaly.  LYMPHATICS:  No cervical, axillary or inguinal adenopathy.  LUNGS:  Clear to auscultation bilaterally.  BACK:  No costovertebral angle tenderness.  CHEST:  Keloid formation.  HEART:  PMI not displaced or sustained.  S1 and S2 within normal limits.  No S3, no  murmurs.  ABDOMEN:  Flat, positive bowel sounds, normal in frequency and pitch.  No bruits, no rebound, no guarding, no midline pulsatile mass, no  hepatomegaly, no splenomegaly.  SKIN:  No rashes, no nodules.  EXTREMITIES:  Two-plus pulses throughout, no edema, no cyanosis, no  clubbing.  NEUROLOGIC:  Oriented to person, place and time.  Cranial nerves II  through XII grossly intact.  Motor grossly intact throughout.   EKG:  Atrial fibrillation, axis within normal limits, intervals within  normal limits, no acute STT-wave changes.   ASSESSMENT AND PLAN:  1. The plan will be tomorrow to increase him to 100 mg twice a day of      the flecainide.  He will come in early next week for an elective      cardioversion.  We will again insure that he has therapeutic      Coumadin before doing this.  I would like to have him off his      Digoxin.  I do not think he needs it for rate control.  There is      anecdotal evidence of adverse outcome  when the Digoxin is continued      during cardioversion.  He will remain on his calcium channel      blocker and beta blocker.  If he maintains sinus rhythm, I will      continue him on the flecainide and then follow with an exercise      treadmill test, as indicated on this drug.  2. Heart failure with a well-preserved ejection fraction.  He is doing      well on the diuretic and we may be able to do away with this      eventually.  He will follow salt and fluid restriction.  3. Followup:  We will see him at the time of his cardioversion.  He      knows to call me with any palpitations, presyncope or syncope.  He      should just present to the emergency room with the latter.     [name], MD, [facility]  Electronically Signed    JH/MedQ  DD: 12/20/2006  DT: 12/20/2006  Job #: [id number]   cc:   Rosalyn Gess. Norins, MD

## 2010-11-09 NOTE — Consult Note (Signed)
NAME:  Edward Pittman, Edward Pittman NO.:  [id number]   MEDICAL RECORD NO.:  [id number]          PATIENT TYPE:  INP   LOCATION:  3701                         FACILITY:  [facility]   PHYSICIAN:  [name], MD, FACCDATE OF BIRTH:  07/22/1940   DATE OF CONSULTATION:  10/27/2006  DATE OF DISCHARGE:                                 CONSULTATION   PRIMARY CARDIOLOGIST:  Lonzo Cloud. Kriste Basque, MD, [facility], being  seen by Dr. [name].   PRIMARY CARE PHYSICIAN:  Dr. Illene Regulus.   PATIENT PROFILE:  A 70 year old African American male with no prior  history of coronary disease who presented with a several month history  of dyspnea and a new diagnosis of AFib with RVR.   PROBLEMS:  1. Atrial fibrillation with rapid ventricular response.  2. Hyperlipidemia.  3. Hypertension.  4. Remote tobacco abuse, quitting 9 years ago.  5. A history of hernia repair at age 73.  6. History of degenerative joint disease and hip pain.  7. History of keloid/scarring.   HISTORY OF PRESENT ILLNESS:  [age]-year-old African American male  with no prior history of CAD was in his usual state of health when  approximately 2-3 months ago he began to experience dyspnea upon  awakening that would resolve spontaneously.  More recently, he has also  noted mild chest congestion with dyspnea awakening him from sleep in the  early a.m. hours, causing restlessness and so he would sit up and then  dyspnea would resolve spontaneously.  He saw Dr. Debby Bud on May 1, and an  ECG was performed revealing AFib with RVR with rates in the 150s.  He  was sent to the ED where he was ruled out and was initiated on IV  diltiazem.  His diltiazem was weaned off briefly and then initiated back  at 2.5 mg per hour and he is currently in the 70s-80s.  He is currently  asymptomatic.  He denies any chest pain, PND, orthopnea, dizziness,  syncope or edema.   ALLERGIES:  NO KNOWN DRUG ALLERGIES.   CURRENT MEDICATIONS:  1.  Aspirin 325 mg daily.  2. Enoxaparin 40 mg subcu q.h.s.  3. Lopressor 25 mg b.i.d.  4. Simvastatin 40 mg q.p.m.  5. Diltiazem drip 2 mg per hour.   FAMILY HISTORY:  Mother died at age 61 of a brain tumor.  Father died of  unknown cause at age 45, although patient thinks he may have had a GI  bleed.  He has 5 sisters and 4 brothers.  He does have a sister with  diabetes, but there is no history of coronary disease.   SOCIAL HISTORY:  He lives in [place] with his wife.  He works as an  Psychologist, prison and probation services.  He was married for 18 years and then divorced and  then subsequently married and is currently married for 14 years to his  second wife.  He has 2 daughters, 2 sons and 6 grandchildren.  He smoked  off and on for several years, but quit 9 years ago.  He denies any  alcohol.  He used to use marijuana frequently  before the 1970s.   REVIEW OF SYSTEMS:  Positive for nocturnal sweats for about the past 20  years.  Dyspnea is outlined in the HPI.  He thinks in retrospect he may  have had some palpitations.  Otherwise, all systems reviewed and  negative.   PHYSICAL EXAMINATION:  VITAL SIGNS:  Temperature 98.9.  Heart rate 93.  Respirations 18.  Blood pressure 118/74.  Pulse oximetry 98% on room  air.  His weight is 222 pounds.  GENERAL:  Pleasant African American male in no acute distress.  Awake,  alert and oriented x3.  HEENT:  Normal.  NECK:  No bruits or JVD.  No thyromegaly.  No adenopathy.  LUNGS:  Respirations regular, unlabored.  Clear to auscultation.  CARDIAC:  Irregular, irregular S1, S2.  No S3. S4 murmurs.  ABDOMEN:  Round, soft, nontender, nondistended.  Bowel sounds present  x4.  EXTREMITIES:  Warm and dry.  No clubbing, cyanosis or edema.  Dorsalis  pedis, posterior tibial pulses 2+, negative bilaterally.   Chest x-ray showed no acute findings with cardiomegaly.  EKG shows AFib  with no acute ST changes.   LAB WORK:  Hemoglobin 14.0, hematocrit 40.8, WBC 8.6,  platelets 376.  Sodium 135, potassium 3.5, chloride 104, CO2 25, BUN 13, creatinine  0.98, glucose 109, total bilirubin 1.5, alkaline phosphatase 148, AST  43, ALT 49.  TSH 4.331.  Total protein 6.3, albumin 3.4.  Troponin-I is  0.03 x2.  Total cholesterol 164, triglycerides 56, HDL 29, LDL 124.   ASSESSMENT/PLAN:  1. Atrial fibrillation with rapid ventricular response, question      duration.  He is currently well rate controlled on beta-blocker and      low dose diltiazem therapy.  We will plan to increase the dose of      his beta-blocker and discontinue diltiazem.  I will change Lovenox      to 1 mg/kg b.i.d. and also obtain a 2D echocardiogram and      magnesium.  He is slightly hypokalemic and we will supplement his      potassium.  This TSH was within normal limits.  If he does not      convert on his own, we will plan to cardiovert in approximately 3-4      weeks and, therefore, we will initiate Coumadin at this point.  If      following cardioversion he maintains sinus rhythm given his CHADS      score of 0:1 (questionable history of hypertension) he would likely      only require aspirin in the longterm, unless electrocardioversion      became necessary.  2. Dyspnea, question etiology.  Question if patient has been in atrial      fibrillation times several months and this may be contributing to      his dyspnea.  Check a 2D echocardiogram.  If echo is within normal      limits could consider outpatient Myoview.  3. Hyperlipidemia.  LDL is 124.  Continue statin therapy.  4. Hypertension.  This is currently well controlled on a combination      of beta-blocker and diltiazem.  We are upping his beta-blocker and      discontinue diltiazem as above.      Nicolasa Ducking, ANP      [name], MD, [facility]  Electronically Signed    CB/MEDQ  D:  10/27/2006  T:  10/28/2006  Job:  [id number]

## 2010-11-09 NOTE — Assessment & Plan Note (Signed)
[facility]                            CARDIOLOGY OFFICE NOTE   Edward Pittman, Edward Pittman                     MRN:          [id number]  DATE:06/30/2008                            DOB:          07-01-1940    PRIMARY CARE PHYSICIAN:  Rosalyn Gess. Norins, MD   REASON FOR PRESENTATION:  Evaluate the patient with atrial fibrillation.   HISTORY OF PRESENT ILLNESS:  The patient is 70 years old.  He presents  for followup of the above.  Since I saw him, he has continued to have  occasional paroxysms of fibrillation that may last most of the day.  However, this is not any different than it was since he started Tikosyn.  This was still much better than when he was off his drug when he would  go into atrial fibrillation and not convert.  He does not feel any  presyncope or syncope.  He is not having any chest pain or shortness of  breath.  He denies any PND or orthopnea.  He has not been exercising as  much as I would like.  Unfortunately, he has gained weight from eating  too much.   PAST MEDICAL HISTORY:  Persistent atrial fibrillation (now paroxysmal on  Tikosyn), low normal ejection fraction (50-55% with a negative stress  perfusion study), mild-to-moderate mitral regurgitation, hypertension,  hernia repair x2, and degenerative joint disease with hip pain and  keloid formation.   ALLERGIES:  None.   MEDICATIONS:  1. Furosemide 40 mg daily.  2. Simvastatin 40 mg daily.  3. Coumadin 5 mg as directed.  4. Potassium 10 mEq daily.  5. Tikosyn 0.5 mg q.12 h.  6. Benazepril 5 mg daily.  7. Metoprolol 25 mg b.i.d.   REVIEW OF SYSTEMS:  As stated in the HPI and otherwise negative for  other systems.   PHYSICAL EXAMINATION:  GENERAL:  The patient is in no distress.  VITAL SIGNS:  Blood pressure 140/75, heart rate 59 and regular, weight  237 pounds, body mass index 31.  NECK:  No jugular venous distention at 45 degrees; carotid upstroke  brisk and symmetric;  no bruits, no thyromegaly.  LYMPHATICS:  No adenopathy.  LUNGS:  Clear to auscultation bilaterally.  BACK:  No costovertebral angle tenderness.  CHEST:  Unremarkable.  HEART:  PMI not displaced or sustained; S1 and S2 within normal limits;  no S3, no S4; no clicks, no rubs, no murmurs.  ABDOMEN:  Obese; positive bowel sounds, normal in frequency and pitch;  no bruits, no rebound, no guarding; no midline pulsatile mass; no  organomegaly.  SKIN:  No rashes, multiple keloids.  EXTREMITIES:  2+ pulses throughout; no edema, no cyanosis, no clubbing.  NEURO:  Grossly intact throughout.   EKG; sinus rhythm, rate 59, axis within normal limits, intervals within  normal limits, no acute ST-T wave changes.   ASSESSMENT AND PLAN:  1. Atrial fibrillation.  The patient has paroxysms, but these are much      improved on the Tikosyn.  He is tolerating this.  I have warned him      about low  potassium.  He has a potassium prescription in the      pharmacy and will pick it up.  He has had normal electrolytes      earlier this year.  I will defer further lab work to Dr. Scotty Court.      His QT interval is fine on the EKG.  He is tolerating the Tikosyn.  2. Hypertension, blood pressure is borderline.  Hopefully, with 15      pounds of weight loss, we do not have to worry about this.  He      states he takes it at home.  The pharmacy is another place, and      then it is always less than 140/90.  3. Weight.  We discussed weight loss with calorie counting and      exercise.  4. Followup.  I will see him back in 6 months or sooner if needed.     [name], MD, [facility]  Electronically Signed    JH/MedQ  DD: 06/30/2008  DT: 07/01/2008  Job #: [id number]   cc:   Rosalyn Gess. Norins, MD

## 2010-11-09 NOTE — Discharge Summary (Signed)
NAME:  Edward Pittman, Edward Pittman NO.:  [id number]   MEDICAL RECORD NO.:  [id number]          PATIENT TYPE:  INP   LOCATION:  2020                         FACILITY:  [facility]   PHYSICIAN:  [name], MD, FACCDATE OF BIRTH:  1941-01-14   DATE OF ADMISSION:  06/16/2007  DATE OF DISCHARGE:  06/19/2007                               DISCHARGE SUMMARY   PRIMARY CAREGIVER:  Dr. Debby Bud   This patient has no known drug allergies.  Dictation time greater than  35 minutes.   FINAL DIAGNOSES:  1. Atrial fibrillation/admitted for Tikosyn therapy.      a.     Tachybrady on monitor as outpatient.      b.     Spontaneous conversion to sinus rhythm after dose #3 of       Tikosyn.      c.     Bradycardia on Tikosyn, metoprolol discontinued.      d.     Tikosyn dose adjusted down to 250 mcg but with readjustment       to 500 mcg the patient was able to tolerate this with no       prolongation of the QT.  2. Hypertension.  Benazepril started in place of metoprolol which had      been discontinued.  3. Subtherapeutic INR on admission; Coumadin has been adjusted this      admission.   PROCEDURES:  Tikosyn therapy with serial electrocardiograms monitoring  this patient's QT on Tikosyn therapy.   BRIEF HISTORY:  Edward Pittman is a 70 year old male who has known atrial  fibrillation.  He is on chronic Coumadin therapy.  The original plan was  rate control.  However, the patient showing tachy/brady rates on  outpatient monitor.  This finding makes it more amendable to treat this  patient was an antiarrhythmic and he will be admitted for rhythm control  under Tikosyn.   HOSPITAL COURSE:  The patient presented to [facility] December  20.  He started Tikosyn therapy that evening.  He converted to a sinus  rhythm after dose #3 of Tikosyn spontaneously.  He was somewhat  bradycardic and his metoprolol was held.  The patient has a history of  hypertension and benazepril was substituted  for metoprolol.  The patient  is discharging December 23 with a QTc of 440.   DISCHARGE MEDICATIONS:  1. Zocor 40 mg daily at bedtime.  2. Lotensin 5 mg daily.  This is a new medication.  3. Coumadin.  He is to take 2.5 mg on Tuesday, December 23; to take 5      mg Wednesday and Thursday December 24 and December 25; to take 2.5      mg Friday, December 26; to take 5 mg Saturday and Sunday, December      27  and December 28.  He will visit the Coumadin Clinic Monday,      December 29 at 3:45 p.m.  4. Tikosyn 500 mcg one tablet at 8 a.m., one tablet at 8 p.m.  This is      a new medication.  5. Furosemide 40 mg  daily.   FOLLOWUP:  At [facility], [address]  1. Coumadin Clinic Monday, December 29 at 3:45 p.m.  2. He will see Dr. [name] January 27, at 2:15 p.m.  A basic      metabolic panel will be taken at the time of his seeing Dr.      [name].   Of note, the patient's potassium has been monitored and has not required  supplementation during this admission.   LABORATORY STUDIES THIS ADMISSION:  Hemoglobin 17.1, hematocrit 49.8,  white cells 9.2, platelets of 187.  Serum electrolytes:  Sodium 135,  potassium 4.1, chloride 102, carbonate 27, BUN is 12, creatinine 0.99,  glucose is 102.  Pro time on the day of discharge 31.1 and INR is 2.9.  The patient's INR has been creeping up steadily on doses of 5 mg daily.  His alkaline phosphatase 187, SGOT 46, SGPT is 39.  Magnesium this  admission was 2.2.  I think I will send this patient home on 10 mEq of  potassium daily since we are going to restart furosemide.      Maple Mirza, PA      [name], MD, [facility]  Electronically Signed    GM/MEDQ  D:  06/19/2007  T:  06/19/2007  Job:  [id number]   cc:   Rosalyn Gess. Norins, MD

## 2010-11-09 NOTE — Assessment & Plan Note (Signed)
[facility]                            CARDIOLOGY OFFICE NOTE   Edward Pittman, Edward Pittman                     MRN:          [id number]  DATE:01/11/2007                            DOB:          02/11/1941    PRIMARY CARE PHYSICIAN:  Dr. Rosalyn Gess. Norins.   REASON FOR VISIT:  Evaluate patient with atrial fibrillation status post  cardioversion.   HISTORY OF PRESENT ILLNESS:  The patient is a pleasant, 70 year old  gentleman who presents for followup status post cardioversion. I was  planning on elective cardioversion and he had been on therapeutic  Coumadin. However, he had an INR of 1.7 when he came the day. 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 He did  incidentally have some moderate mitral regurgitation. This has been  noted as mild on his transthoracic. His EF was about 50% (this had been  55% on his transthoracic). He had no evidence of thrombus or high-risk  features and still had cardioversion on Flecainide. He has since felt a  few palpitations and wondered if he has been in and out of atrial  fibrillation. He is in sinus rhythm today. He says he feels good. He has  good energy. He is not having any shortness of breath, denied any  presyncope or syncope. He has had no chest pain.   PAST MEDICAL HISTORY:  Persistent atrial fibrillation, ejection fraction  approximately 50-55%, negative stress perfusion study for ischemia, mild  to moderate mitral regurgitation, remote tobacco use quitting 9 years  ago, hypertension, hernia repair at age 49, degenerative joint disease  with hip pain, keloid formation.   ALLERGIES:  None.   MEDICATIONS:  1. Furosemide 40 mg daily.  2. Coumadin per the Coumadin clinic.  3. Metoprolol 25 mg b.i.d.  4. Flecainide 100 mg b.i.d.   REVIEW OF SYSTEMS:  As stated in the HPI and otherwise negative for  other systems.   PHYSICAL EXAMINATION:  GENERAL:  The patient is in no distress.  VITAL SIGNS:  Blood pressure 134/84, heart rate 58 and regular.  HEENT:  Eyelids unremarkable. Pupils equal round and reactive to light.  Fundi not visualized. Oral mucosa unremarkable.  NECK:  No jugular venous distention, wave form within normal limits,  carotid upstroke brisk and symmetric, no bruits, no thyromegaly.  LYMPHATICS:  No adenopathy.  LUNGS:  Clear to auscultation bilaterally.  HEART:  PMI not displaced or sustained, S1 and S2 within normal limits,  no S3, no S4, no murmurs.  ABDOMEN:  Mildly obese, positive bowel sounds, normal to frequency and  pitch, no bruits, no rebound, no guarding, no midline pulsatile mass, no  organomegaly.  SKIN:  No rashes, no nodules.  EXTREMITIES:  2+ pulses, no edema.   ASSESSMENT/PLAN:  1. Atrial fibrillation. The patient has no high-risk features for      flecainide. He is tolerating this medication. His EKG today      demonstrates no QRS widening. He is on beta blocker as is indicated      with  flecainide. He is going to come back for an exercise treadmill      test to look for any proarrhythmias with his medication. He will      remain on the Coumadin for now and we will discuss this going      forward.  2. Mitral regurgitation. This was mild by transthoracic echo. It is      not causing him any symptoms. I will follow this up with physical      exams and echo's in the future.  3. Hypertension. Blood pressure is well controlled. I will continue      the medications as listed.  4. Mildly reduced ejection fraction. He actually had an ejection      fraction of 55% recorded. I suspect that some of this appearance      with slightly reduced ejection fraction was related to his atrial      fibrillation. Will follow this up in the future with repeat      echocardiograms.  5. Followup. Will see the patient back again in about 3 months. He      will be coming back for his  treadmill in the next week or so.     [name], MD, [facility]  Electronically Signed    JH/MedQ  DD: 01/11/2007  DT: 01/12/2007  Job #: [id number]   cc:   Rosalyn Gess. Norins, MD

## 2010-11-09 NOTE — Assessment & Plan Note (Signed)
[facility]                            CARDIOLOGY OFFICE NOTE   Edward Pittman, Edward Pittman                     MRN:          [id number]  DATE:08/30/2007                            DOB:          March 08, 1941    PRIMARY:  Dr. Illene Regulus.   REASON FOR PRESENTATION:  Evaluate patient with atrial fibrillation.   HISTORY OF PRESENT ILLNESS:  The patient is 70 years old.  He has done  well since I last saw him.  He has maintained sinus rhythm and does not  report any paroxysms.  He is not having any presyncope or syncope.  He  is not having any chest pain or shortness of breath.  He is not  exercising like I would like.   PAST MEDICAL HISTORY:  1. Persistent atrial fibrillation (cardioverted and maintaining sinus      rhythm on Tikosyn), EF low normal (50% to 55%), negative stress      perfusion study for ischemia).  2. Mild to moderate mitral regurgitation.  3. Hypertension.  4. Hernia repair x2.  5. Degenerative joint disease with hip pain.  6. Keloid formation.   ALLERGIES:  None.   MEDICATIONS:  1. Furosemide 40 mg daily.  2. Simvastatin 40 mg daily.  3. Coumadin.  4. Benazepril 5 mg daily.  5. Potassium 10 mEq daily.  6. Tikosyn 0.45 mg q.12 h.   REVIEW OF SYSTEMS:  As stated in HPI and otherwise negative for other  systems.   PHYSICAL EXAMINATION:  The patient is in no distress.  Blood pressure 128/78, heart rate 62 and regular, weight 234 pounds,  body mass index 31.  NECK:  No jugular distention at 45 degrees, carotid upstroke brisk and  symmetrical, no bruits, no thyromegaly.  LYMPHATICS:  No adenopathy.  LUNGS:  Clear to auscultation bilaterally.  BACK:  No costovertebral angle tenderness.  CHEST:  Unremarkable except for keloid.  HEART:  PMI not displaced or sustained, S1 and S2 within normal limits,  no S3, no S4, no clicks, no rubs, no murmurs.  ABDOMEN:  Mildly obese;  positive bowel sounds, normal in frequency and pitch; no  bruits, no  rebound, no guarding, no midline pulsatile mass, no organomegaly.  SKIN:  No rashes, no nodules.  EXTREMITIES:  Pulses 2+, no edema.   EKG:  Sinus rhythm, rate 63, axis within normal limits, mild Q-T  prolongation.   ASSESSMENT AND PLAN:  1. Atrial fibrillation.  The patient has maintained sinus rhythm.  The      Q-T is slightly elevated.  I am going to have him come back in 3      months for a repeat EKG.  He is going to continue the Coumadin for      now, as there have been some short paroxysms of probable atrial      fibrillation by symptoms.  He did have a reduced ejection fraction      and hypertension which are his risk factors.  This puts him in a      marginal zone.  He and I have talked at length about  the risks and      benefits of Coumadin and our conclusion is to continue with it for      now.  I am probably going to discontinued it in the future,      however, once I am sure he is maintaining sinus rhythm.  2. Hypertension.  Blood pressure is well controlled and he will      continue the medications as listed.  3. Followup:  I will see him back in 6 months, but again we will check      his EKG in 3 months.  He had his electrolytes done earlier this      year.  We will put him in for a BMET with magnesium when he comes      back in 3 months.     [name], MD, [facility]  Electronically Signed    JH/MedQ  DD: 08/30/2007  DT: 08/30/2007  Job #: [id number]

## 2010-11-09 NOTE — Assessment & Plan Note (Signed)
[facility]                            CARDIOLOGY OFFICE NOTE   SPARROW, SANZO                     MRN:          [id number]  DATE:06/05/2007                            DOB:          05-12-1941    PRIMARY:  Dr. Illene Regulus.   REASON FOR PRESENTATION:  Evaluate patient with atrial fibrillation.   HISTORY OF PRESENT ILLNESS:  Patient is 70 years old.  He presented  today for an exercise treadmill test to make sure he was tolerating  without proarrhythmia a higher dose of flecainide 150 b.i.d.  However,  he was in atrial fibrillation.  On further discussion he says he is in  atrial fibrillation most of the time.  Therefore, he is not receiving  much benefit, in my estimation, from the 150 b.i.d. of flecainide.  We  discussed this at length.  He says he is not particularly symptomatic  with the atrial fibrillation.  Therefore, I will have a plan as outlined  below.  He has not been having any presyncope or syncope.  He said he is  able to walk on a treadmill without any difficulty.  He denies any chest  pain.   PAST MEDICAL HISTORY:  1. Persistent atrial fibrillation.  2. EF 50% - 55%.  3. Negative stress perfusion study for ischemia.  4. Mild to moderate mitral regurgitation.  5. No tobacco use, quitting 9 years ago.  6. Hypertension.  7. Hernia repair x2.  8. Degenerative joint disease with hip pain.  9. Keloid formation.   ALLERGIES:  NONE.   MEDICATIONS:  1. Furosemide 40 mg daily.  2. Simvastatin 40 mg daily.  3. Coumadin.  4. Metoprolol 25 mg b.i.d.  5. Flecainide 150 mg b.i.d.   REVIEW OF SYSTEMS:  As stated in the HPI and otherwise negative for  other systems.   PHYSICAL EXAMINATION:  The patient is in no distress.  Blood pressure  98/70, heart rate 96 and irregular.  NECK:  No jugular venous distention at 90 degrees.  LUNGS:  Clear to auscultation bilaterally.  HEART:  PMI not displaced or sustained, S1 and S2 within  normal limits,  no S3, no murmurs.  ABDOMEN:  Obese, positive bowel sounds normal in frequency and pitch, no  bruits, no rebound, no guarding and no midline pulsatile mass.  SKIN:  No rashes, no nodules.  EXTREMITIES:  With 2+ pulses.   EKG:  Atrial fibrillation, axis within normal limits, intervals within  normal limits.   ASSESSMENT/PLAN:  1. Atrial fibrillation.  The patient seems to be in atrial      fibrillation most of the time per his description, this has changed      from October. I do not think that the flecainide 150 b.i.d. is      working or even doing much to maintain sinus rhythm after further      conversation.  Therefore, we are just going to stop it.  We will      see how he does and make sure he has adequate rate control and for      now  anticoagulation.  Will discuss whether he needs treatment with      Tikosyn in the future.  2. Followup.  He is going to get a 40-hour Holter monitor in about 1      week.  I will see him a week after that.     [name], MD, [facility]  Electronically Signed    JH/MedQ  DD: 06/05/2007  DT: 06/05/2007  Job #: [id number]   cc:   Rosalyn Gess. Norins, MD

## 2010-11-09 NOTE — Assessment & Plan Note (Signed)
[facility]                            CARDIOLOGY OFFICE NOTE   Edward Pittman, Edward Pittman                     MRN:          [id number]  DATE:02/13/2007                            DOB:          1941/01/16    PRIMARY CARE PHYSICIAN:  Rosalyn Gess. Norins, M.D.   REASON FOR VISIT:  Evaluate the patient with atrial fibrillation.   HISTORY OF PRESENT ILLNESS:  The patient presented for an exercise  treadmill today.  However, he was back in atrial fibrillation.  Therefore, I stopped the treadmill and brought him over for an office  visit.  He has been symptomatic in the fibrillation before and I am  trying to keep him in normal rhythm.  He has been cardioverted off  Flecainide and reverted to atrial fibrillation.  He has been  cardioverted now on 100 mg of Flecainide.  However, he is back in atrial  fibrillation.  He is not describing any presyncope or syncope.  He does  feel his beating hard and fast at night.  He has not had any new  shortness of breath, denies any chest discomfort.  His heart rate does  come up to the 110 range just with standing.  He is denying any chest  discomfort, neck, or arm discomfort.   PAST MEDICAL HISTORY:  Persistent atrial fibrillation, ejection fraction  50-55%, negative chest perfusion study for ischemia, mild to moderate  mitral regurgitation, remote tobacco quitting 9 years ago, hypertension,  hernia repair at age 22, degenerative joint disease with hip pain, keloid  formation.   ALLERGIES:  No known drug allergies.   MEDICATIONS:  1. Furosemide 40 mg daily.  2. Simvastatin 40 mg daily.  3. Coumadin.  4. Metoprolol 25 mg b.i.d.  5. Flecainide 100 mg b.i.d.   REVIEW OF SYSTEMS:  As stated in the HPI and otherwise negative for  other systems.   PHYSICAL EXAMINATION:  GENERAL:  The patient is in no distress.  VITAL SIGNS:  Blood pressure 134/84, heart rate 87 and irregular.  HEENT:  Eyes unremarkable, pupils equal,  round, and reactive to light,  fundi not visualized, oral mucosa unremarkable.  NECK:  No jugular venous distention 45 degrees, carotid upstroke brisk  and symmetric, no bruits, and no thyromegaly.  LYMPHATICS:  No cervical, axillary, or inguinal adenopathy.  LUNGS:  Clear to auscultation bilaterally.  BACK:  No costovertebral angle tenderness.  CHEST:  Unremarkable.  HEART:  PMI not displaced or sustained.  S1 and S2 within normal limits.  No S3 and no murmurs.  ABDOMEN:  Flat, positive bowel sounds normal in frequency and pitch.  No  bruits, no rebound, no guarding, no midline pulsatile mass, no  hepatomegaly, and no splenomegaly.  SKIN:  No rashes.  EXTREMITIES:  2+ pulses, no edema.   EKG; sinus rhythm, rate 87, axis within normal limits, intervals within  normal limits.  No acute ST wave change.   ASSESSMENT:  1. Atrial fibrillation.  The patient has atrial fibrillation which has      been very symptomatic in the past.  This is despite 100 mg of  Flecainide.  At this point I am going to increase him to 150 mg      b.i.d.  He is going to remain on the beta blocker.  If he is not in      sinus rhythm when he comes back he will need to be cardioverted      again.  If he fails this, we will consider another agent such as      sotalol.  We will make sure that his Coumadin is therapeutic      through this as we have been doing.  He will come back in 2 weeks      for an electrocardiogram.  2. Mitral regurgitation.  We will follow this with physical exams and      echos in the future.  3. Hypertension.  Breech presentation is well controlled.   FOLLOWUP:  I will see the patient again as reported.     [name], MD, [facility]  Electronically Signed    JH/MedQ  DD: 02/13/2007  DT: 02/14/2007  Job #: [id number]   cc:   Rosalyn Gess. Norins, MD

## 2010-11-09 NOTE — Assessment & Plan Note (Signed)
[facility]                            CARDIOLOGY OFFICE NOTE   KEM, HENSEN                     MRN:          [id number]  DATE:04/10/2007                            DOB:          [birth year]    PRIMARY:  Dr. Illene Regulus.   REASON FOR PRESENTATION:  Evaluate the patient with atrial fibrillation.   HISTORY OF PRESENT ILLNESS:  The patient is a pleasant 70 year old  African-American gentleman with persistent atrial fibrillation.  He  failed to maintain sinus rhythm after cardioversion.  He also had  recurrent atrial fibrillation on 100 mg b.i.d. of flecainide.  I did  increase him to 150 mg b.i.d.  He is in sinus rhythm today.  He says he  occasionally feels he will go out of rhythm.  He is not particularly  symptomatic with this.  He is not having any pre-syncope or syncope.  He  is not having any chest discomfort, neck, or arm discomfort.  He is able  to be active and does a little exercise without limitations.  He has had  no shortness of breath, PND, or orthopnea.   PAST MEDICAL HISTORY:  Persistent atrial fibrillation, ejection fraction  50-55%.  Stress perfusion study negative for ischemia.  Mild to moderate  mitral regurgitation.  Remote tobacco use, quitting 9 years ago.  Hypertension.  Hernia repair x2.  Degenerative joint disease with hip  pain.  Keloid formation.   ALLERGIES:  None.   MEDICATIONS:  1. Furosemide 40 mg daily.  2. Simvastatin 40 mg daily.  3. Coumadin.  4. Metoprolol 25 mg b.i.d.  5. Flecainide 150 mg b.i.d.   REVIEW OF SYSTEMS:  As stated in the HPI and otherwise negative for  other systems.   PHYSICAL EXAMINATION:  The patient is well-appearing and pleasant.  He  is in no distress.  Blood pressure 126/88, heart rate 62 and regular, afebrile.  HEENT:  Eyelids unremarkable.  Pupils are equal, round, and reactive to  light and accommodation.  Fundi are not visualized.  Oral mucosa  unremarkable.  NECK:   No jugular venous distension at 45 degrees.  Carotid upstroke  brisk and symmetric, no bruits, thyromegaly.  LYMPHATICS:  No cervical, axillary, or inguinal adenopathy.  LUNGS:  Clear to auscultation bilaterally.  BACK:  No costovertebral angle tenderness.  CHEST:  Unremarkable.  HEART:  PMI not displaced or sustained, S1 and S2 within normal limits,  no S3, no S4, no clicks, rubs, murmurs.  ABDOMEN:  Flat, positive bowel sounds, normal in frequency and pitch, no  bruits, rebound, guarding.  No midline pulsatile masses, hepatomegaly,  splenomegaly.  SKIN:  No rashes, no nodules.  Multiple keloids.  EXTREMITIES:  With 2+ pulses throughout, no edema, cyanosis, clubbing.  NEURO:  Oriented to person, place, and time, cranial nerves 2-12 grossly  intact, motor grossly intact.   ELECTROCARDIOGRAM:  Sinus rhythm, rate 62, intervals within normal  limits including PR interval and QRS.  No acute ST-T wave changes.   ASSESSMENT AND PLAN:  1. Atrial fibrillation.  The patient now seems to be more in sinus  rhythm than not with his current dose of flecainide.  I am going to      leave him on medications as listed and bring him back for an      exercise treadmill test to make sure there is no proarrhythmia.  I      do still think the patient benefits from Coumadin, though this is a      judgment call with yearly risk of stroke probably in the 4-5%      range.  He and I have had this discussion.  He  understands the      risks and benefits of Coumadin and agrees to proceed.  He is not      having any contraindications to it.  2. Followup.  I will see the patient back at the time of his treadmill      test or sooner if needed.     [name], MD, [facility]  Electronically Signed    JH/MedQ  DD: 04/10/2007  DT: 04/11/2007  Job #: [id number]   cc:   Rosalyn Gess. Norins, MD

## 2010-11-09 NOTE — Cardiovascular Report (Signed)
NAME:  Edward Pittman, Edward Pittman NO.:  [id number]   MEDICAL RECORD NO.:  [id number]          PATIENT TYPE:  OIB   LOCATION:  2855                         FACILITY:  [facility]   PHYSICIAN:  Bevelyn Buckles. Bensimhon, MDDATE OF BIRTH:  1941/02/19   DATE OF PROCEDURE:  12/26/2006  DATE OF DISCHARGE:                            CARDIAC CATHETERIZATION   PROCEDURES:  Direct current cardioversion.   PRIMARY CARE PHYSICIAN:  Rosalyn Gess. Norins, MD.   CARDIOLOGIST:  [name], MD, [facility].   INDICATION:  Symptomatic atrial fibrillation with difficult rate  control.   DESCRIPTION OF THE PROCEDURE:  Mr. Braddy is a very pleasant 70-year-  old male with a persistent atrial fibrillation which has been  symptomatic.  He failed a recent cardioversion.  He was initially  started on flecainide but has not converted to a sinus rhythm.  He was  referred today by Dr. [name] for cardioversion.  On arrival today, his  INR is 1.7.  He was given a dose of therapeutic Lovenox.  He then  underwent transesophageal echocardiogram which showed an ejection  fraction of about 50%.  There was mild mitral regurgitation and mild  tricuspid regurgitation.  The left atrial appendage was widely patent  with no evidence of clot.  There was some smoke in the left atrium.   After TEE was performed, he was further sedated by anesthesiology and  then received a single 200 joule synchronized biphasic shock with prompt  conversion to sinus bradycardia.  Initially his heart rates were in the  low 30s.  Currently his heart rate is between 40 and 45 in sinus  bradycardia.   CONCLUSION:  Successful direct current cardioversion.  We will  discontinue his Cartia and cut his metoprolol back to 25 b.i.d..  He  will follow with Dr. [name].      Bevelyn Buckles. Bensimhon, MD  Electronically Signed     DRB/MEDQ  D:  12/26/2006  T:  12/26/2006  Job:  [id number]   cc:   Rosalyn Gess. Norins, MD  [name], MD, [facility]

## 2010-11-09 NOTE — Discharge Summary (Signed)
NAME:  Edward Pittman, Edward Pittman NO.:  [id number]   MEDICAL RECORD NO.:  [id number]          PATIENT TYPE:  INP   LOCATION:  3701                         FACILITY:  [facility]   PHYSICIAN:  Valerie A. Felicity Coyer, MDDATE OF BIRTH:  1940/08/15   DATE OF ADMISSION:  10/26/2006  DATE OF DISCHARGE:  10/31/2006                               DISCHARGE SUMMARY   ADDENDUM:  The patient's follow-up blood pressure after oral Cardizem as  126/76. I spoke with Edsel Petrin for [facility] cardiology.  She is  the process of setting up the patient's stress test and will make  arrangements for stress test prior to Dr. Jenene Slicker follow-up  appointment and she will contact the patient at home with appointment  information.      Sandford Craze, NP      Raenette Rover. Felicity Coyer, MD  Electronically Signed    MO/MEDQ  D:  10/31/2006  T:  10/31/2006  Job:  [id number]

## 2010-11-09 NOTE — Assessment & Plan Note (Signed)
[facility]                            CARDIOLOGY OFFICE NOTE   Edward Pittman, Edward Pittman                     MRN:          [id number]  DATE:07/24/2007                            DOB:          26-Oct-1940    The primary is Dr. Illene Regulus.   REASON FOR PRESENTATION:  Evaluate patient with atrial fibrillation.   HISTORY OF PRESENT ILLNESS:  The patient was hospitalized on December 20  to December 23 for Tikosyn load.  He actually converted on this drug.  He thinks he has had a few paroxysms of atrial fibrillation, but these  have been brief and infrequent.  He is otherwise felt well.  He has not  had any presyncope or syncope.  He has had no chest pain or shortness of  breath.  He is getting his Coumadin checked.   PAST MEDICAL HISTORY:  1. Persistent atrial fibrillation.  2. EF 50-55% (negative stress perfusion study for ischemia).  3. Mild to moderate mitral regurgitation.  4. Hypertension.  5. Hernia repair x2.  6. Degenerative joint disease with hip pain.  7. Keloid formation.   ALLERGIES:  None.   MEDICATIONS:  1. Furosemide 40 mg daily.  2. Simvastatin 40 mg daily.  3. Coumadin.  4. Benazepril 5 mg daily.  5. Potassium 10 mEq daily.  6. Tikosyn 0.5 mg q.12h.   REVIEW OF SYSTEMS:  As stated in the HPI and otherwise negative for  other systems.   PHYSICAL EXAMINATION:  The patient is in no distress.  Blood pressure  130/88, heart 61 and regular, weight 232 pounds, body mass 31.  HEENT:  Eyelids unremarkable; pupils equal, round, and reactive to  light; fundi not visualized; oral mucosa remarkable.  NECK:  No jugular distention at 45 degrees, carotid upstroke brisk and  symmetric, no bruits, no thyromegaly.  LYMPHATICS:  No cervical, axillary, or inguinal adenopathy.  LUNGS:  Clear to auscultation bilaterally.  BACK:  No costovertebral angle tenderness.  CHEST:  Unremarkable.  HEART:  PMI not displaced or sustained, S1-S2 within  normal limits, no  S3, no S4, no clicks, rubs, no murmurs.  ABDOMEN:  Mildly obese, positive bowel sounds, normal in frequency and  pitch, no bruits, no rebound, no guarding, no midline pulsatile mass, no  hepatomegaly, no splenomegaly.  SKIN:  No rashes, no nodules.  EXTREMITIES:  2+ pulses throughout, no edema, no cyanosis, no clubbing.  NEUROLOGICAL:  Oriented to person, place, and time; cranial nerves II-  XII grossly intact; motor grossly intact throughout.   EKG:  Sinus rhythm, rate 61, axis within normal limits, intervals within  normal limits, no acute ST-T wave changes.   ASSESSMENT/PLAN:  1. Atrial fibrillation.  The patient is maintaining sinus rhythm for      the most part.  He may be having some short paroxysms.  We did      discuss coming off the Coumadin as he does have a low [place] score.      However, he is somewhat hesitant about this.  I think there is a      small possibility we would  need this drug again for cardioversion,      and so I will not push the issue of discontinuing it right now but      will going forward.  2. Hypertension.  Blood pressure is well-controlled, and he will      continue medications as listed.  3. Follow-up.  I will see the patient back in about 2 months at which      time I will most likely discontinue the Coumadin.  He can come off      the Coumadin if he is to have a colonoscopy which he is planning.     [name], MD, [facility]  Electronically Signed    JH/MedQ  DD: 07/24/2007  DT: 07/25/2007  Job #: [id number]   cc:   Rosalyn Gess. Norins, MD

## 2010-11-09 NOTE — Assessment & Plan Note (Signed)
[facility]                            CARDIOLOGY OFFICE NOTE   Edward Pittman, Edward Pittman                     MRN:          [id number]  DATE:12/06/2007                            DOB:          1940-07-01    PRIMARY CARE PHYSICIAN:  Rosalyn Gess. Norins, MD   REASON FOR PRESENTATION:  The patient with atrial fibrillation.   HISTORY OF PRESENT ILLNESS:  The patient presents for followup of the  above.  He has done well since I last saw him until about the last  month.  He started increasing his caffeine intake by his own admission.  He had some increased paroxysms of what he thought was atrial  fibrillations though they would be relatively short-lived.  In fact, he  was here for blood work on December 03, 2007, and mentioned that he had an  arrhythmia.  He was found to have atrial fibrillation.  His rate was in  the 120s.  He was actually started on metoprolol 25 mg twice a day.  He  said his rhythm soon broke.  He is now laid off the caffeine and is not  having as many as of these palpitations.  He said prior to that visit,  he was having it about 1 time a week.  Prior to the last month, he was  having it much less frequently than that and never sustained like he  used to be off it when he was not on Tikosyn.  He says he will get a  little chest discomfort when that happens similar to that described in  previous notes.  Otherwise, he feels well.  He is very active.  He does  a lot of physical work.  With that, he denies any chest discomfort, neck  or arm discomfort.  He has no palpitation, no presyncope or syncope.  He  has no PND or orthopnea.   PAST MEDICAL HISTORY:  Persistent atrial fibrillation (now paroxysmal on  Tikosyn), low normal ejection fraction (50-55% with a negative stress  perfusion study), mild-to-moderate mitral regurgitation, hypertension,  hernia repair x2, degenerative joint disease with hip pain, and keloid  formation.   ALLERGIES:   None.   MEDICATIONS:  1. Furosemide 40 mg daily.  2. Simvastatin 40 mg daily.  3. Coumadin.  4. Potassium 20 mEq daily.  5. Tikosyn 0.5 mg q.12 h.  6. Benazepril 5 mg daily.  7. Metoprolol 25 mg b.i.d.   REVIEW OF SYSTEMS:  As stated in the HPI and otherwise negative for  other systems.   PHYSICAL EXAMINATION:  GENERAL:  The patient is in no distress.  VITAL SIGNS:  Blood pressure 153/88, heart rate 61 and regular, weight  is 229 pounds, and body mass index is 30.  HEENT:  Eyelids are unremarkable.  Pupils are equal, round, and reactive  to light.  Fundi not visualized.  Oral mucosa unremarkable.  NECK:  No jugular venous distention.  Waveform within normal limits.  Carotid upstroke brisk and symmetrical.  No bruits.  No thyromegaly.  LYMPHATICS:  No adenopathy.  LUNGS:  Clear to auscultation bilaterally.  BACK:  No costovertebral angle tenderness.  CHEST:  Unremarkable.  HEART:  PMI nondisplaced or sustained.  S1 and S2 within normal limits.  No S3, S4, clicks.  No rubs.  No murmurs.  ABDOMEN:  Flat.  Positive bowel sounds.  Normal in frequency and pitch.  No bruits.  No rebound.  No guarding.  No midline pulsatile mass.  No  organomegaly.  SKIN:  No rashes.  No nodules.  EXTREMITIES:  Pulses 2+ throughout.  No edema.  No cyanosis.  No  clubbing.  NEUROLOGIC:  Oriented to person, place, and time.  Cranial nerves II  through XII grossly intact.  Motor grossly intact.   EKG, sinus rhythm, rate 61, axis within normal limits.  Intervals within  normal limits.  No acute ST-wave changes.   ASSESSMENT AND PLAN:  1. Atrial fibrillation.  The patient is still having paroxysms of      atrial fibrillation though these are much less problematic and      nonsustained like they used to be off the Tikosyn.  Therefore, I      consider the Tikosyn to be a successful drug as does the patient.      He did, however, have past history of smoke in the left atrial      cavity with his atrial  fibrillation.  He has some MR which is      probably mild or moderate when he is in atrial fibrillation.  He      has a mildly reduced ejection fraction.  Given this, I am going to      go ahead and continue him on the Coumadin.  He understands that      this is a judgment call and there are risks and benefits to this.      He agrees as he has not had any complications with the Coumadin      that he would like to continue it.  2. Hypertension.  Blood pressure is slightly elevated today but not      typically.  He will keep an eye on this by getting it checked at      home.  He needs weight loss as a therapeutic lifestyle change (TLC)      which will help his blood pressure control.  3. Followup.  We will see him back again in 6 months.     [name], MD, [facility]  Electronically Signed    JH/MedQ  DD: 12/06/2007  DT: 12/07/2007  Job #: [id number]   cc:   Rosalyn Gess. Norins, MD

## 2010-11-09 NOTE — Discharge Summary (Signed)
NAME:  Edward, Pittman NO.:  [id number]   MEDICAL RECORD NO.:  [id number]          PATIENT TYPE:  INP   LOCATION:  3701                         FACILITY:  [facility]   PHYSICIAN:  Sandford Craze, NP DATE OF BIRTH:  [birth year]   DATE OF ADMISSION:  10/26/2006  DATE OF DISCHARGE:  10/31/2006                               DISCHARGE SUMMARY   DISCHARGE DIAGNOSES:  1. Atrial fibrillation status post failed direct current cardioversion      Oct 30, 2006.  2. Questionable sleep apnea.  He was noted to have some episodes of      hypoxia on an ultrasound event monitor overnight.  Will need      outpatient sleep study.  3. Dyslipidemia.  4. Acute CHF exacerbation secondary to atrial fibrillation, rapid      ventricular response.   HISTORY OF PRESENT ILLNESS:  [name] is a 70 year old African-  American male who was admitted on Oct 26, 2006 with chief complaint of  shortness of breath.  He also noted some left-sided chest pressure which  happens both at rest and with exertion.  He was noted to be hypertensive  in the office and have an irregular heart rate.  EKG revealed atrial  fibrillation with rapid ventricular response.  The patient was admitted  for further evaluation and treatment.   PAST MEDICAL HISTORY:  Degenerative joint disease.   COURSE OF HOSPITALIZATION:  1. Atrial fibrillation with rapid ventricular response and secondary      acute CHF exacerbation.  The patient was admitted and was placed on      a Cardizem drip.  A cardiology consultation was obtained, and the      patient was seen in consultation by Dr. [name].  He was started      on a beta blocker as well as full-dose Lovenox and Coumadin for      anticoagulation.  His Lopressor was titrated up, and he was also      treated with digoxin.  He underwent cardioversion on Oct 30, 2006      which was unsuccessful.  At this time, the plan per Dr. [name] is      to add p.o. Cardizem.  If his blood  pressure tolerates, plan to      discharge home this afternoon.  The patient will be scheduled by      Cardiology for an exercise Cardiolite.  If his Cardiolite is okay,      plan to initiate Flecainide in approximately three weeks followed      by additional cardioversion per Dr. [name].   The patient will be set up in the Coumadin clinic.  He did require IV  Lasix for volume issues which is being converted to orally 40 mg p.o.  daily at time of discharge.  He will need continued monitoring of his  electrolytes an volume status.  His rate is currently stable in the 80s.  1. Questionable sleep apnea.  The patient may need the addition of      CPAP but will need outpatient sleep study.  Defer to patient's  primary care.  2. Dyslipidemia.  The patient will be continued on a statin at the      time of discharge.   PERTINENT LABORATORIES AT TIME OF DISCHARGE:  Hemoglobin 16.4,  hematocrit 48.1, INR 3.3, BUN 15, creatinine 0.97.   MEDICATIONS AT TIME OF DISCHARGE:  1. Coumadin 5 mg p.o. daily in the evening.  2. Diltiazem CD 120 mg p.o. daily.  3. Lasix 40 mg p.o. daily.  4. Digoxin 0.125 mg p.o. daily.  5. Lopressor 100 mg p.o. b.i.d.  6. Zocor 40 mg p.o. daily.   DISPOSITION:  1. The patient will be discharged to home.  He is instructed to follow      up with Dr. Debby Bud on May 22 at 3:30 p.m.  He will need an      evaluation at that time for possible outpatient sleep study as well      as followup blood pressure and electrolytes.  2. The patient is instructed to follow up with Dr. [name] on      May 20 at 10:30 a.m.  He is also scheduled to follow up in the      Coumadin clinic on May 8 at 9:00 a.m.  He is instructed to return      to the emergency room if she develops weakness or shortness of      breath.      Sandford Craze, NP     MO/MEDQ  D:  10/31/2006  T:  10/31/2006  Job:  [id number]   cc:   Rosalyn Gess. Norins, MD  [name], MD, [facility]

## 2010-11-25 ENCOUNTER — Encounter: Payer: BC Managed Care – PPO | Admitting: *Deleted

## 2010-12-13 ENCOUNTER — Other Ambulatory Visit: Payer: Self-pay | Admitting: Cardiology

## 2011-01-07 ENCOUNTER — Ambulatory Visit (INDEPENDENT_AMBULATORY_CARE_PROVIDER_SITE_OTHER): Payer: BC Managed Care – PPO | Admitting: *Deleted

## 2011-01-07 DIAGNOSIS — I4891 Unspecified atrial fibrillation: Secondary | ICD-10-CM

## 2011-01-07 LAB — POCT INR: INR: 2.8

## 2011-02-04 ENCOUNTER — Encounter: Payer: Self-pay | Admitting: Cardiology

## 2011-02-04 ENCOUNTER — Ambulatory Visit (INDEPENDENT_AMBULATORY_CARE_PROVIDER_SITE_OTHER): Payer: BC Managed Care – PPO | Admitting: *Deleted

## 2011-02-04 DIAGNOSIS — I4891 Unspecified atrial fibrillation: Secondary | ICD-10-CM

## 2011-02-04 LAB — POCT INR: INR: 2.9

## 2011-02-26 ENCOUNTER — Other Ambulatory Visit: Payer: Self-pay | Admitting: Cardiology

## 2011-03-03 ENCOUNTER — Encounter: Payer: Self-pay | Admitting: Cardiology

## 2011-03-03 ENCOUNTER — Ambulatory Visit (INDEPENDENT_AMBULATORY_CARE_PROVIDER_SITE_OTHER): Payer: BC Managed Care – PPO | Admitting: *Deleted

## 2011-03-03 ENCOUNTER — Ambulatory Visit (INDEPENDENT_AMBULATORY_CARE_PROVIDER_SITE_OTHER): Payer: BC Managed Care – PPO | Admitting: Cardiology

## 2011-03-03 DIAGNOSIS — I10 Essential (primary) hypertension: Secondary | ICD-10-CM

## 2011-03-03 DIAGNOSIS — I4891 Unspecified atrial fibrillation: Secondary | ICD-10-CM

## 2011-03-03 DIAGNOSIS — I428 Other cardiomyopathies: Secondary | ICD-10-CM

## 2011-03-03 MED ORDER — WARFARIN SODIUM 5 MG PO TABS: ORAL_TABLET | ORAL | Status: DC

## 2011-03-03 NOTE — Assessment & Plan Note (Signed)
Despite the paroxysms he feels like Tikosyn is working well.  He will continue this and coumadin.  He will consider Pradaxa.

## 2011-03-03 NOTE — Patient Instructions (Signed)
Follow up in 6 months with Dr Hochrein.  You will receive a letter in the mail 2 months before you are due.  Please call us when you receive this letter to schedule your follow up appointment.   The current medical regimen is effective;  continue present plan and medications.  

## 2011-03-03 NOTE — Assessment & Plan Note (Signed)
His EF was mildly low 4/11.  No change in therapy or further imaging is indicated.

## 2011-03-03 NOTE — Assessment & Plan Note (Signed)
The blood pressure is at target. No change in medications is indicated. We will continue with therapeutic lifestyle changes (TLC).  

## 2011-03-03 NOTE — Progress Notes (Signed)
HPI The patient presents for follow up of atrial fibrillation.  Since I last saw him he has done well.  The patient denies any new symptoms such as chest discomfort, neck or arm discomfort. There has been no new shortness of breath, PND or orthopnea. There have been no reported, presyncope or syncope.  He does have occasional palpitations and knows that he is in afib right now.  However, he also knows that he will pop back to sinus in a few minutes.  No Known Allergies  Current Outpatient Prescriptions  Medication Sig Dispense Refill  . benazepril (LOTENSIN) 10 MG tablet Take 10 mg by mouth daily.        Marland Kitchen dofetilide (TIKOSYN) 500 MCG capsule Take 500 mcg by mouth 2 (two) times daily.        . furosemide (LASIX) 40 MG tablet Take 40 mg by mouth as needed.        . metoprolol (LOPRESSOR) 50 MG tablet Take 75 mg by mouth 2 (two) times daily.        . potassium chloride (MICRO-K) 10 MEQ CR capsule Take 10 mEq by mouth daily.        . simvastatin (ZOCOR) 40 MG tablet take 1 tablet by mouth once daily  90 tablet  2  . warfarin (COUMADIN) 5 MG tablet TAKE AS DIRECTED PER COUMADIN CLINIC.  40 tablet  3    Past Medical History  Diagnosis Date  . Persistent atrial fibrillation   . Nonischemic cardiomyopathy   . Dyslipidemia   . Keloid   . DJD (degenerative joint disease) of hip   . HTN (hypertension)   . Mild mitral regurgitation by prior echocardiogram   . Hx of adenomatous colonic polyps   . Hx of colonoscopy     Past Surgical History  Procedure Date  . Femoral hernia repair     at age 55    ROS:  As stated in the HPI and negative for all other systems.  PHYSICAL EXAM BP 132/98  Pulse 114  Resp 16  Ht 6\' 1"  (1.854 m)  Wt 232 lb (105.235 kg)  BMI 30.61 kg/m2 GENERAL:  Well appearing HEENT:  Pupils equal round and reactive, fundi not visualized, oral mucosa unremarkable NECK:  No jugular venous distention, waveform within normal limits, carotid upstroke brisk and symmetric, no  bruits, no thyromegaly LYMPHATICS:  No cervical, inguinal adenopathy LUNGS:  Clear to auscultation bilaterally BACK:  No CVA tenderness CHEST:  Unremarkable HEART:  PMI not displaced or sustained,S1 and S2 within normal limits, no S3, no S4, no clicks, no rubs, no murmurs, irregular ABD:  Flat, positive bowel sounds normal in frequency in pitch, no bruits, no rebound, no guarding, no midline pulsatile mass, no hepatomegaly, no splenomegaly EXT:  2 plus pulses throughout, no edema, no cyanosis no clubbing SKIN:  No rashes no nodules, keloids NEURO:  Cranial nerves II through XII grossly intact, motor grossly intact throughout PSYCH:  Cognitively intact, oriented to person place and time  EKG:  Atrial fibrillation, no acute ST T wave changes.    ASSESSMENT AND PLAN

## 2011-03-16 ENCOUNTER — Other Ambulatory Visit: Payer: Self-pay | Admitting: Cardiology

## 2011-03-31 ENCOUNTER — Other Ambulatory Visit: Payer: Self-pay

## 2011-03-31 ENCOUNTER — Ambulatory Visit (INDEPENDENT_AMBULATORY_CARE_PROVIDER_SITE_OTHER): Payer: BC Managed Care – PPO | Admitting: *Deleted

## 2011-03-31 DIAGNOSIS — I4891 Unspecified atrial fibrillation: Secondary | ICD-10-CM

## 2011-04-01 ENCOUNTER — Telehealth: Payer: Self-pay | Admitting: Cardiology

## 2011-04-01 LAB — COMPREHENSIVE METABOLIC PANEL
ALT: 39
Alkaline Phosphatase: 187 — ABNORMAL HIGH
CO2: 24
Calcium: 9.7
GFR calc non Af Amer: >60
Glucose, Bld: 100 — ABNORMAL HIGH
Sodium: 137

## 2011-04-01 LAB — CBC
MCHC: 34.3
MCV: 95.1
RBC: 5.24

## 2011-04-01 LAB — BASIC METABOLIC PANEL
BUN: 12
CO2: 27
Chloride: 102
Creatinine, Ser: 0.99
Glucose, Bld: 102 — ABNORMAL HIGH

## 2011-04-01 LAB — MAGNESIUM: Magnesium: 2.1

## 2011-04-01 LAB — PROTIME-INR
INR: 2.5 — ABNORMAL HIGH
Prothrombin Time: 20.5 — ABNORMAL HIGH

## 2011-04-01 MED ORDER — DOFETILIDE 500 MCG PO CAPS: 500.0000 ug | ORAL_CAPSULE | Freq: Two times a day (BID) | ORAL | Status: DC

## 2011-04-01 NOTE — Telephone Encounter (Signed)
Pt calling needing tikosyn called into [facility]. Pt is at pharmacy now.  Pt was informed that msg was sent around as a high priority.

## 2011-04-01 NOTE — Telephone Encounter (Signed)
Refill sent to pharmacy.   

## 2011-04-12 LAB — CBC
HCT: 44.1
Hemoglobin: 14.9
MCHC: 33.8
RDW: 13.5

## 2011-04-12 LAB — BASIC METABOLIC PANEL
CO2: 29
Chloride: 104
Glucose, Bld: 109 — ABNORMAL HIGH
Potassium: 3.7
Sodium: 138

## 2011-04-15 ENCOUNTER — Other Ambulatory Visit: Payer: Self-pay | Admitting: Cardiology

## 2011-04-28 ENCOUNTER — Ambulatory Visit (INDEPENDENT_AMBULATORY_CARE_PROVIDER_SITE_OTHER): Payer: BC Managed Care – PPO | Admitting: *Deleted

## 2011-04-28 DIAGNOSIS — I4891 Unspecified atrial fibrillation: Secondary | ICD-10-CM

## 2011-05-26 ENCOUNTER — Ambulatory Visit (INDEPENDENT_AMBULATORY_CARE_PROVIDER_SITE_OTHER): Payer: BC Managed Care – PPO | Admitting: *Deleted

## 2011-05-26 DIAGNOSIS — I4891 Unspecified atrial fibrillation: Secondary | ICD-10-CM

## 2011-05-26 LAB — POCT INR: INR: 2.2

## 2011-06-23 ENCOUNTER — Ambulatory Visit (INDEPENDENT_AMBULATORY_CARE_PROVIDER_SITE_OTHER): Payer: BC Managed Care – PPO | Admitting: *Deleted

## 2011-06-23 DIAGNOSIS — I4891 Unspecified atrial fibrillation: Secondary | ICD-10-CM

## 2011-06-23 LAB — POCT INR: INR: 2.1

## 2011-07-11 ENCOUNTER — Other Ambulatory Visit: Payer: Self-pay | Admitting: *Deleted

## 2011-07-11 MED ORDER — METOPROLOL TARTRATE 50 MG PO TABS: 75.0000 mg | ORAL_TABLET | Freq: Two times a day (BID) | ORAL | Status: DC

## 2011-07-21 ENCOUNTER — Ambulatory Visit (INDEPENDENT_AMBULATORY_CARE_PROVIDER_SITE_OTHER): Payer: BC Managed Care – PPO | Admitting: Cardiology

## 2011-07-21 ENCOUNTER — Ambulatory Visit (INDEPENDENT_AMBULATORY_CARE_PROVIDER_SITE_OTHER): Payer: BC Managed Care – PPO | Admitting: *Deleted

## 2011-07-21 ENCOUNTER — Encounter: Payer: BC Managed Care – PPO | Admitting: *Deleted

## 2011-07-21 ENCOUNTER — Encounter: Payer: Self-pay | Admitting: Cardiology

## 2011-07-21 DIAGNOSIS — I080 Rheumatic disorders of both mitral and aortic valves: Secondary | ICD-10-CM

## 2011-07-21 DIAGNOSIS — I10 Essential (primary) hypertension: Secondary | ICD-10-CM

## 2011-07-21 DIAGNOSIS — I428 Other cardiomyopathies: Secondary | ICD-10-CM

## 2011-07-21 DIAGNOSIS — I4891 Unspecified atrial fibrillation: Secondary | ICD-10-CM

## 2011-07-21 LAB — POCT INR: INR: 2.7

## 2011-07-21 MED ORDER — METOPROLOL TARTRATE 100 MG PO TABS: 100.0000 mg | ORAL_TABLET | Freq: Two times a day (BID) | ORAL | Status: DC

## 2011-07-21 NOTE — Assessment & Plan Note (Signed)
We had a long discussion about this. He still remains symptomatic when he is in fibrillation. He thinks he is in fibrillation most of the time but he does think that the Tikosyn works to some degree.  I will continue this medication. I will increase his metoprolol for better rate control. I will repeat an echocardiogram. I'm going to send him back to see Dr. Johney Frame to further discuss ablation.

## 2011-07-21 NOTE — Progress Notes (Signed)
   HPI The patient presents for follow up of atrial fibrillation.  Since I last saw him he thinks that he is in atrial fibrillation most of the time.  He is bothered by this with rapid heart rates and notices this mostly at night.  He is not having presyncope or syncope.  He is not having chest pain, neck or arm pain.  He has no SOB, PND or orthopnea.  He does not like being in atrial fibrillation and feels better in sinus.  He did see Dr. Johney Frame in the past and ablation was considered but he wanted to put this off.   No Known Allergies  Current Outpatient Prescriptions  Medication Sig Dispense Refill  . benazepril (LOTENSIN) 10 MG tablet take 1 tablet by mouth once daily  90 tablet  2  . dofetilide (TIKOSYN) 500 MCG capsule Take 1 capsule (500 mcg total) by mouth 2 (two) times daily.  60 capsule  3  . furosemide (LASIX) 40 MG tablet Take 40 mg by mouth as needed.        . metoprolol (LOPRESSOR) 100 MG tablet Take 1 tablet (100 mg total) by mouth 2 (two) times daily.  180 tablet  3  . potassium chloride (MICRO-K) 10 MEQ CR capsule take 1 capsule by mouth once daily  30 capsule  12  . simvastatin (ZOCOR) 40 MG tablet take 1 tablet by mouth once daily  90 tablet  2  . warfarin (COUMADIN) 5 MG tablet Take as directed by Anticoagulation clinic   40 tablet  3    Past Medical History  Diagnosis Date  . Persistent atrial fibrillation   . Nonischemic cardiomyopathy   . Dyslipidemia   . Keloid   . DJD (degenerative joint disease) of hip   . HTN (hypertension)   . Mild mitral regurgitation by prior echocardiogram   . Hx of adenomatous colonic polyps   . Hx of colonoscopy     Past Surgical History  Procedure Date  . Femoral hernia repair     at age 26    ROS:  As stated in the HPI and negative for all other systems.  PHYSICAL EXAM BP 110/80  Pulse 134  Ht 6\' 1"  (1.854 m)  Wt 230 lb 1.9 oz (104.382 kg)  BMI 30.36 kg/m2 GENERAL:  Well appearing HEENT:  Pupils equal round and reactive,  fundi not visualized, oral mucosa unremarkable NECK:  No jugular venous distention, waveform within normal limits, carotid upstroke brisk and symmetric, no bruits, no thyromegaly LYMPHATICS:  No cervical, inguinal adenopathy LUNGS:  Clear to auscultation bilaterally BACK:  No CVA tenderness CHEST:  Unremarkable HEART:  PMI not displaced or sustained,S1 and S2 within normal limits, no S3, no S4, no clicks, no rubs, no murmurs, irregular ABD:  Flat, positive bowel sounds normal in frequency in pitch, no bruits, no rebound, no guarding, no midline pulsatile mass, no hepatomegaly, no splenomegaly EXT:  2 plus pulses throughout, no edema, no cyanosis no clubbing SKIN:  No rashes no nodules, keloids NEURO:  Cranial nerves II through XII grossly intact, motor grossly intact throughout PSYCH:  Cognitively intact, oriented to person place and time  EKG:  Atrial fibrillation, no acute ST T wave changes.  07/21/2011   ASSESSMENT AND PLAN

## 2011-07-21 NOTE — Patient Instructions (Addendum)
Please increase Metoprolol to 100 mg twice a day  Your physician has requested that you have an echocardiogram. Echocardiography is a painless test that uses sound waves to create images of your heart. It provides your doctor with information about the size and shape of your heart and how well your heart's chambers and valves are working. This procedure takes approximately one hour. There are no restrictions for this procedure.  Follow up with Dr Johney Frame for A Fib.  Follow with Dr [name] in 1 month if not seeing Dr Johney Frame before then

## 2011-07-21 NOTE — Assessment & Plan Note (Signed)
As above I will check an echocardiogram

## 2011-07-21 NOTE — Progress Notes (Signed)
Addended by: Yolonda Kida on: 07/21/2011 02:05 PM   Modules accepted: Orders

## 2011-07-21 NOTE — Assessment & Plan Note (Signed)
This being treated in the context of treating his other issues.

## 2011-07-21 NOTE — Progress Notes (Signed)
Addended by: Yolonda Kida on: 07/21/2011 02:19 PM   Modules accepted: Orders

## 2011-07-21 NOTE — Assessment & Plan Note (Signed)
I will reassess this with the echocardiogram.

## 2011-07-28 ENCOUNTER — Ambulatory Visit (HOSPITAL_COMMUNITY): Payer: BC Managed Care – PPO | Attending: Cardiovascular Disease | Admitting: Radiology

## 2011-07-28 DIAGNOSIS — I428 Other cardiomyopathies: Secondary | ICD-10-CM | POA: Insufficient documentation

## 2011-07-28 DIAGNOSIS — I059 Rheumatic mitral valve disease, unspecified: Secondary | ICD-10-CM

## 2011-07-28 DIAGNOSIS — I080 Rheumatic disorders of both mitral and aortic valves: Secondary | ICD-10-CM | POA: Insufficient documentation

## 2011-07-28 DIAGNOSIS — I10 Essential (primary) hypertension: Secondary | ICD-10-CM | POA: Insufficient documentation

## 2011-07-28 DIAGNOSIS — I4891 Unspecified atrial fibrillation: Secondary | ICD-10-CM | POA: Insufficient documentation

## 2011-08-03 ENCOUNTER — Telehealth: Payer: Self-pay | Admitting: Cardiology

## 2011-08-03 NOTE — Telephone Encounter (Signed)
Fu call °Patient returning your call °

## 2011-08-03 NOTE — Telephone Encounter (Signed)
Reviewed results of echo with pt and scheduled a follow up appointment.

## 2011-08-09 ENCOUNTER — Other Ambulatory Visit: Payer: Self-pay | Admitting: Cardiology

## 2011-08-09 ENCOUNTER — Ambulatory Visit (INDEPENDENT_AMBULATORY_CARE_PROVIDER_SITE_OTHER): Payer: BC Managed Care – PPO | Admitting: Cardiology

## 2011-08-09 ENCOUNTER — Encounter: Payer: Self-pay | Admitting: Cardiology

## 2011-08-09 DIAGNOSIS — I10 Essential (primary) hypertension: Secondary | ICD-10-CM

## 2011-08-09 DIAGNOSIS — I428 Other cardiomyopathies: Secondary | ICD-10-CM | POA: Insufficient documentation

## 2011-08-09 DIAGNOSIS — I4891 Unspecified atrial fibrillation: Secondary | ICD-10-CM

## 2011-08-09 NOTE — Assessment & Plan Note (Signed)
I suspect his ejection fraction was lower in part because he was in fibrillation at the time of this examination. I will continue the meds as listed and we'll followup repeat echoes in the future. At present he has no heart failure symptoms.

## 2011-08-09 NOTE — Progress Notes (Signed)
   HPI The patient presents for follow up of atrial fibrillation.  He seemed to be in persistent atrial fibrillation.  I ordered an echocardiogram which showed that the EF is now 25 - 30% which is down from 50% previously.  I did increase his metoprolol when I last saw him.  He reports that since that time he is doing much better.  He thinks that he is in sinus rhythm most of the time now. He is improved his diet and has increased his exercise time. He did a mild to track yesterday he had no shortness of breath, palpitations or chest discomfort. He denies any presyncope or syncope. He's had no weight gain or edema PND or orthopnea.   No Known Allergies  Current Outpatient Prescriptions  Medication Sig Dispense Refill  . benazepril (LOTENSIN) 10 MG tablet take 1 tablet by mouth once daily  90 tablet  2  . dofetilide (TIKOSYN) 500 MCG capsule Take 1 capsule (500 mcg total) by mouth 2 (two) times daily.  60 capsule  3  . furosemide (LASIX) 40 MG tablet Take 40 mg by mouth as needed.        . metoprolol (LOPRESSOR) 100 MG tablet Take 1 tablet (100 mg total) by mouth 2 (two) times daily.  180 tablet  3  . potassium chloride (MICRO-K) 10 MEQ CR capsule take 1 capsule by mouth once daily  30 capsule  12  . simvastatin (ZOCOR) 40 MG tablet take 1 tablet by mouth once daily  90 tablet  2  . warfarin (COUMADIN) 5 MG tablet Take as directed by Anticoagulation clinic   40 tablet  3    Past Medical History  Diagnosis Date  . Persistent atrial fibrillation   . Nonischemic cardiomyopathy   . Dyslipidemia   . Keloid   . DJD (degenerative joint disease) of hip   . HTN (hypertension)   . Mild mitral regurgitation by prior echocardiogram   . Hx of adenomatous colonic polyps   . Hx of colonoscopy     Past Surgical History  Procedure Date  . Femoral hernia repair     at age 44    ROS:  As stated in the HPI and negative for all other systems.  PHYSICAL EXAM BP 115/70  Pulse 62  Ht 6\' 1"  (1.854 m)   Wt 229 lb (103.874 kg)  BMI 30.21 kg/m2 GENERAL:  Well appearing NECK:  No jugular venous distention, waveform within normal limits, carotid upstroke brisk and symmetric, no bruits, no thyromegaly LUNGS:  Clear to auscultation bilaterally CHEST:  Unremarkable HEART:  PMI not displaced or sustained,S1 and S2 within normal limits, no S3, no S4, no clicks, no rubs, no murmurs, regular ABD:  Flat, positive bowel sounds normal in frequency in pitch, no bruits, no rebound, no guarding, no midline pulsatile mass, no hepatomegaly, no splenomegaly EXT:  2 plus pulses throughout, no edema, no cyanosis no clubbing   ASSESSMENT AND PLAN

## 2011-08-09 NOTE — Assessment & Plan Note (Signed)
We hooked him up to a monitor today and he was in sinus rhythm.  I will not change his meds.  However, I will ask him to keep his appt with Dr. Johney Frame for further consider ablation.

## 2011-08-09 NOTE — Assessment & Plan Note (Signed)
His blood pressure is being treated in the context of treating the other issues as above.

## 2011-08-09 NOTE — Patient Instructions (Signed)
Your physician wants you to follow-up in: 6 MONTHS WITH DR. HOCHREIN. You will receive a reminder letter in the mail two months in advance. If you don't receive a letter, please call our office to schedule the follow-up appointment.  

## 2011-08-10 ENCOUNTER — Other Ambulatory Visit: Payer: Self-pay | Admitting: Cardiology

## 2011-08-10 ENCOUNTER — Other Ambulatory Visit: Payer: Self-pay | Admitting: *Deleted

## 2011-08-10 MED ORDER — WARFARIN SODIUM 5 MG PO TABS: ORAL_TABLET | ORAL | Status: DC

## 2011-08-24 ENCOUNTER — Ambulatory Visit: Payer: BC Managed Care – PPO | Admitting: Internal Medicine

## 2011-09-01 ENCOUNTER — Ambulatory Visit (INDEPENDENT_AMBULATORY_CARE_PROVIDER_SITE_OTHER): Payer: BC Managed Care – PPO | Admitting: *Deleted

## 2011-09-01 DIAGNOSIS — I4891 Unspecified atrial fibrillation: Secondary | ICD-10-CM

## 2011-09-15 ENCOUNTER — Ambulatory Visit (INDEPENDENT_AMBULATORY_CARE_PROVIDER_SITE_OTHER): Payer: BC Managed Care – PPO | Admitting: Internal Medicine

## 2011-09-15 ENCOUNTER — Encounter: Payer: Self-pay | Admitting: Internal Medicine

## 2011-09-15 ENCOUNTER — Encounter: Payer: Self-pay | Admitting: *Deleted

## 2011-09-15 ENCOUNTER — Ambulatory Visit (INDEPENDENT_AMBULATORY_CARE_PROVIDER_SITE_OTHER): Payer: BC Managed Care – PPO | Admitting: *Deleted

## 2011-09-15 VITALS — BP 96/62 | HR 43 | Wt 225.0 lb

## 2011-09-15 DIAGNOSIS — I4891 Unspecified atrial fibrillation: Secondary | ICD-10-CM

## 2011-09-15 DIAGNOSIS — I4819 Other persistent atrial fibrillation: Secondary | ICD-10-CM

## 2011-09-15 DIAGNOSIS — I428 Other cardiomyopathies: Secondary | ICD-10-CM

## 2011-09-15 LAB — POCT INR: INR: 2.1

## 2011-09-15 NOTE — Progress Notes (Signed)
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  He was seen by me in 2011.  At that time, he declined afib ablation.  He has subsequently been treated with tikosyn but continues to have paroxysms of afib.  He has not recently required cardioversion.  He continues to have episodes of afib with RVR however.  His EF has recently declined further to 20-25%.  He is felt by Dr [name] to possibly have worsened EF related to afib.  He therefore presents today for further consideration of catheter ablation. He is clearly symptomatic with afib.  He reports symptoms of fatigue and decreased exercise tolerance. Today, he denies symptoms of palpitations, chest pain, shortness of breath (above baseline), orthopnea, PND, lower extremity edema, dizziness, presyncope, syncope, or neurologic sequela. The patient is tolerating medications without difficulties and is otherwise without complaint today.   Past Medical History  Diagnosis Date  . Persistent atrial fibrillation   . Nonischemic cardiomyopathy   . Dyslipidemia   . Keloid   . DJD (degenerative joint disease) of hip   . HTN (hypertension)   . Mild mitral regurgitation by prior echocardiogram   . Hx of adenomatous colonic polyps   . Hx of colonoscopy   . Chronic systolic dysfunction of left ventricle    Past Surgical History  Procedure Date  . Femoral hernia repair     at age 43    Current Outpatient Prescriptions  Medication Sig Dispense Refill  . benazepril (LOTENSIN) 10 MG tablet take 1 tablet by mouth once daily  90 tablet  2  . furosemide (LASIX) 40 MG tablet Take 40 mg by mouth as needed.       . metoprolol (LOPRESSOR) 100 MG tablet Take 1 tablet (100 mg total) by mouth 2 (two) times daily.  180 tablet  3  . potassium chloride (MICRO-K) 10 MEQ CR capsule take 1 capsule by mouth once daily  30  capsule  12  . simvastatin (ZOCOR) 40 MG tablet take 1 tablet by mouth once daily  90 tablet  2  . TIKOSYN 500 MCG capsule take 1 capsule by mouth twice a day  60 capsule  6  . warfarin (COUMADIN) 5 MG tablet Take as directed by Anticoagulation clinic  40 tablet  3    No Known Allergies  History   Social History  . Marital Status: Married    Spouse Name: N/A    Number of Children: 4  . Years of Education: N/A   Occupational History  . Autobody Doctor, hospital    Social History Main Topics  . Smoking status: Former Smoker    Quit date: 08/09/1999  . Smokeless tobacco: Not on file   Comment: quit 9 years ago  . Alcohol Use: No  . Drug Use: No  . Sexually Active: Not on file   Other Topics Concern  . Not on file   Social History Narrative  . No narrative on file    Family History  Problem Relation Age of Onset  . Brain cancer Mother   . Colon cancer      uncle  . Diabetes Sister   . Diabetes Sister   . Heart disease Neg Hx   . Breast cancer Sister   . Prostate cancer      uncle    ROS- All systems are reviewed and negative  except as per the HPI above  Physical Exam: Filed Vitals:   09/15/11 1529  BP: 96/62  Pulse: 43  Weight: 225 lb (102.059 kg)    GEN- The patient is well appearing, alert and oriented x 3 today.   Head- normocephalic, atraumatic Eyes-  Sclera clear, conjunctiva pink Ears- hearing intact Oropharynx- clear Neck- supple, no JVP Lymph- no cervical lymphadenopathy Lungs- Clear to ausculation bilaterally, normal work of breathing Heart- Regular rate and rhythm, no murmurs, rubs or gallops, PMI not laterally displaced GI- soft, NT, ND, + BS Extremities- no clubbing, cyanosis, or edema MS- no significant deformity or atrophy Skin- no rash or lesion Psych- euthymic mood, full affect Neuro- strength and sensation are intact  EKG today reveals sinus rhythm 45 bpm, PR 170, QRS 96, Qtc 417  Assessment and Plan:

## 2011-09-15 NOTE — Assessment & Plan Note (Signed)
The patient has symptomatic atrial fibrillation and CHF which is felt to be due to his afib.  He has failed medical therapy with tikosyn.  He is appropriately anticoagulated with coumadin. Therapeutic strategies for afib including medicine and ablation were discussed in detail with the patient today. Risk, benefits, and alternatives to EP study and radiofrequency ablation for afib were also discussed in detail today. These risks include but are not limited to stroke, bleeding, vascular damage, tamponade, perforation, damage to the esophagus, lungs, and other structures, pulmonary vein stenosis, worsening renal function, and death. The patient understands these risk and wishes to proceed.  We will therefore proceed with catheter ablation at the next available time.

## 2011-09-15 NOTE — Patient Instructions (Signed)

## 2011-09-15 NOTE — Assessment & Plan Note (Signed)
Possibly related to his afib We will reassess his EF once his rhythm is controlled

## 2011-09-19 ENCOUNTER — Ambulatory Visit: Payer: BC Managed Care – PPO | Admitting: Cardiology

## 2011-09-21 ENCOUNTER — Ambulatory Visit (INDEPENDENT_AMBULATORY_CARE_PROVIDER_SITE_OTHER): Payer: BC Managed Care – PPO | Admitting: *Deleted

## 2011-09-21 ENCOUNTER — Other Ambulatory Visit (INDEPENDENT_AMBULATORY_CARE_PROVIDER_SITE_OTHER): Payer: BC Managed Care – PPO

## 2011-09-21 ENCOUNTER — Other Ambulatory Visit: Payer: Self-pay | Admitting: *Deleted

## 2011-09-21 DIAGNOSIS — I4891 Unspecified atrial fibrillation: Secondary | ICD-10-CM

## 2011-09-21 DIAGNOSIS — I4819 Other persistent atrial fibrillation: Secondary | ICD-10-CM

## 2011-09-21 LAB — CBC WITH DIFFERENTIAL/PLATELET
Basophils Relative: 0.6 % (ref 0.0–3.0)
Eosinophils Relative: 2.1 % (ref 0.0–5.0)
HCT: 45.2 % (ref 39.0–52.0)
Hemoglobin: 15.1 g/dL (ref 13.0–17.0)
Lymphocytes Relative: 44.1 % (ref 12.0–46.0)
Lymphs Abs: 3.3 10*3/uL (ref 0.7–4.0)
Monocytes Relative: 8 % (ref 3.0–12.0)
Neutro Abs: 3.4 10*3/uL (ref 1.4–7.7)
RBC: 4.67 Mil/uL (ref 4.22–5.81)
RDW: 14 % (ref 11.5–14.6)
WBC: 7.5 10*3/uL (ref 4.5–10.5)

## 2011-09-21 LAB — BASIC METABOLIC PANEL
CO2: 27 mEq/L (ref 19–32)
Calcium: 9.3 mg/dL (ref 8.4–10.5)
Chloride: 103 mEq/L (ref 96–112)
Creatinine, Ser: 0.8 mg/dL (ref 0.4–1.5)
Glucose, Bld: 92 mg/dL (ref 70–99)

## 2011-09-21 LAB — POCT INR: INR: 2.5

## 2011-09-22 ENCOUNTER — Other Ambulatory Visit: Payer: Self-pay | Admitting: *Deleted

## 2011-09-22 DIAGNOSIS — I4891 Unspecified atrial fibrillation: Secondary | ICD-10-CM

## 2011-09-27 ENCOUNTER — Encounter (HOSPITAL_COMMUNITY): Payer: Self-pay | Admitting: Pharmacy Technician

## 2011-09-28 ENCOUNTER — Ambulatory Visit (INDEPENDENT_AMBULATORY_CARE_PROVIDER_SITE_OTHER): Payer: BC Managed Care – PPO | Admitting: *Deleted

## 2011-09-28 DIAGNOSIS — I4819 Other persistent atrial fibrillation: Secondary | ICD-10-CM

## 2011-09-28 DIAGNOSIS — I4891 Unspecified atrial fibrillation: Secondary | ICD-10-CM

## 2011-09-28 LAB — POCT INR: INR: 3.2

## 2011-09-29 ENCOUNTER — Other Ambulatory Visit: Payer: Self-pay | Admitting: *Deleted

## 2011-09-29 ENCOUNTER — Encounter: Payer: Self-pay | Admitting: *Deleted

## 2011-09-29 DIAGNOSIS — I4891 Unspecified atrial fibrillation: Secondary | ICD-10-CM

## 2011-10-04 ENCOUNTER — Other Ambulatory Visit: Payer: BC Managed Care – PPO

## 2011-10-05 ENCOUNTER — Encounter (INDEPENDENT_AMBULATORY_CARE_PROVIDER_SITE_OTHER): Payer: BC Managed Care – PPO | Admitting: *Deleted

## 2011-10-05 ENCOUNTER — Other Ambulatory Visit (INDEPENDENT_AMBULATORY_CARE_PROVIDER_SITE_OTHER): Payer: BC Managed Care – PPO

## 2011-10-05 DIAGNOSIS — I4891 Unspecified atrial fibrillation: Secondary | ICD-10-CM

## 2011-10-05 DIAGNOSIS — I4819 Other persistent atrial fibrillation: Secondary | ICD-10-CM

## 2011-10-05 LAB — POCT INR: INR: 2.9

## 2011-10-05 LAB — BASIC METABOLIC PANEL
Chloride: 104 mEq/L (ref 96–112)
GFR: 83.67 mL/min (ref >60.00)
Glucose, Bld: 81 mg/dL (ref 70–99)
Potassium: 4.6 mEq/L (ref 3.5–5.1)
Sodium: 140 mEq/L (ref 135–145)

## 2011-10-05 LAB — CBC WITH DIFFERENTIAL/PLATELET
Eosinophils Relative: 2.1 % (ref 0.0–5.0)
HCT: 44.1 % (ref 39.0–52.0)
Hemoglobin: 14.8 g/dL (ref 13.0–17.0)
Lymphs Abs: 2.8 10*3/uL (ref 0.7–4.0)
Monocytes Relative: 10.9 % (ref 3.0–12.0)
Neutro Abs: 3.8 10*3/uL (ref 1.4–7.7)
RDW: 13.9 % (ref 11.5–14.6)

## 2011-10-10 ENCOUNTER — Encounter (HOSPITAL_COMMUNITY)
Admission: RE | Disposition: A | Discharge status: Home Health | Payer: Self-pay | Source: Ambulatory Visit | Attending: Internal Medicine

## 2011-10-10 ENCOUNTER — Encounter (HOSPITAL_COMMUNITY): Payer: Self-pay | Admitting: Gastroenterology

## 2011-10-10 ENCOUNTER — Ambulatory Visit (HOSPITAL_COMMUNITY)
Admission: RE | Admit: 2011-10-10 | Discharge: 2011-10-10 | Disposition: A | Discharge status: Home Health | Payer: BC Managed Care – PPO | Source: Ambulatory Visit | Attending: Internal Medicine | Admitting: Internal Medicine

## 2011-10-10 DIAGNOSIS — I428 Other cardiomyopathies: Secondary | ICD-10-CM | POA: Insufficient documentation

## 2011-10-10 DIAGNOSIS — M169 Osteoarthritis of hip, unspecified: Secondary | ICD-10-CM | POA: Insufficient documentation

## 2011-10-10 DIAGNOSIS — M1610 Unilateral primary osteoarthritis, unspecified hip: Secondary | ICD-10-CM | POA: Insufficient documentation

## 2011-10-10 DIAGNOSIS — I059 Rheumatic mitral valve disease, unspecified: Secondary | ICD-10-CM | POA: Insufficient documentation

## 2011-10-10 DIAGNOSIS — I4891 Unspecified atrial fibrillation: Secondary | ICD-10-CM

## 2011-10-10 DIAGNOSIS — I079 Rheumatic tricuspid valve disease, unspecified: Secondary | ICD-10-CM | POA: Insufficient documentation

## 2011-10-10 DIAGNOSIS — I10 Essential (primary) hypertension: Secondary | ICD-10-CM | POA: Insufficient documentation

## 2011-10-10 HISTORY — PX: TEE WITHOUT CARDIOVERSION: SHX5443

## 2011-10-10 SURGERY — ECHOCARDIOGRAM, TRANSESOPHAGEAL
Anesthesia: Moderate Sedation

## 2011-10-10 MED ORDER — FENTANYL CITRATE 0.05 MG/ML IJ SOLN
INTRAMUSCULAR | Status: DC | PRN
  Administered 2011-10-10 (×2): 25 ug via INTRAVENOUS

## 2011-10-10 MED ORDER — MIDAZOLAM HCL 10 MG/2ML IJ SOLN
INTRAMUSCULAR | Status: AC
  Filled 2011-10-10: qty 2

## 2011-10-10 MED ORDER — LIDOCAINE VISCOUS 2 % MT SOLN
OROMUCOSAL | Status: DC | PRN
  Administered 2011-10-10: 10 mL via OROMUCOSAL

## 2011-10-10 MED ORDER — FENTANYL CITRATE 0.05 MG/ML IJ SOLN
INTRAMUSCULAR | Status: AC
  Filled 2011-10-10: qty 2

## 2011-10-10 MED ORDER — MIDAZOLAM HCL 10 MG/2ML IJ SOLN
INTRAMUSCULAR | Status: DC | PRN
  Administered 2011-10-10 (×2): 2 mg via INTRAVENOUS

## 2011-10-10 MED ORDER — SODIUM CHLORIDE 0.9 % IV SOLN
INTRAVENOUS | Status: DC
  Administered 2011-10-10: 500 mL via INTRAVENOUS

## 2011-10-10 MED ORDER — BENZOCAINE 20 % MT SOLN: 1.0000 "application " | OROMUCOSAL | Status: DC | PRN

## 2011-10-10 MED ORDER — MIDAZOLAM HCL 10 MG/2ML IJ SOLN: 10.0000 mg | Freq: Once | INTRAMUSCULAR | Status: DC

## 2011-10-10 MED ORDER — FENTANYL CITRATE 0.05 MG/ML IJ SOLN: 250.0000 ug | Freq: Once | INTRAMUSCULAR | Status: DC

## 2011-10-10 MED ORDER — SODIUM CHLORIDE 0.9 % IV SOLN: 250.0000 mL | INTRAVENOUS | Status: DC | PRN

## 2011-10-10 MED ORDER — LIDOCAINE VISCOUS 2 % MT SOLN
OROMUCOSAL | Status: AC
  Filled 2011-10-10: qty 15

## 2011-10-10 NOTE — H&P (Signed)
Patient ID: Edward Pittman MRN: [id number], DOB/AGE: Sep 22, 1940   Admit date: 10/10/2011 Date of Consult: @TODAY @  Primary Physician: Illene Regulus, MD, MD Primary Cardiologist: [facility]   Problem List: Past Medical History  Diagnosis Date  . Persistent atrial fibrillation   . Nonischemic cardiomyopathy   . Dyslipidemia   . Keloid   . DJD (degenerative joint disease) of hip   . HTN (hypertension)   . Mild mitral regurgitation by prior echocardiogram   . Hx of adenomatous colonic polyps   . Hx of colonoscopy   . Chronic systolic dysfunction of left ventricle     Past Surgical History  Procedure Date  . Femoral hernia repair     at age 24     Allergies: No Known Allergies  HPI: Patient with symptomatic afib  Preablation for TEE.    Inpatient Medications:    . fentaNYL  250 mcg Intravenous Once  . midazolam  10 mg Intravenous Once    Family History  Problem Relation Age of Onset  . Brain cancer Mother   . Breast cancer Mother   . Colon cancer      uncle  . Prostate cancer      uncle  . Diabetes Sister   . Breast cancer Sister   . Brain cancer Sister   . Diabetes Sister   . Heart disease Neg Hx   . Heart attack Father   . Lung cancer Brother   . Lung cancer Brother      History   Social History  . Marital Status: Married    Spouse Name: N/A    Number of Children: 4  . Years of Education: N/A   Occupational History  . Autobody Doctor, hospital    Social History Main Topics  . Smoking status: Former Smoker    Quit date: 08/09/1999  . Smokeless tobacco: Not on file   Comment: quit 9 years ago  . Alcohol Use: No  . Drug Use: No  . Sexually Active: Not on file   Other Topics Concern  . Not on file   Social History Narrative  . No narrative on file     Review of Systems: General: negative for chills, fever, night sweats or weight changes.  Cardiovascular: negative for chest pain, dyspnea on exertion, edema, orthopnea, palpitations, paroxysmal  nocturnal dyspnea or shortness of breath Dermatological: negative for rash Respiratory: negative for cough or wheezing Urologic: negative for hematuria Abdominal: negative for nausea, vomiting, diarrhea, bright red blood per rectum, melena, or hematemesis Neurologic: negative for visual changes, syncope, or dizziness All other systems reviewed and are otherwise negative except as noted above.  Physical Exam: Filed Vitals:   10/10/11 1240  BP: 138/67  Pulse:   Temp:   Resp: 22   No intake or output data in the 24 hours ending 10/10/11 1243  General: Well developed, well nourished, in no acute distress. Head: Normocephalic, atraumatic, sclera non-icteric Neck: Negative for carotid bruits. JVP not elevated. Lungs: Clear bilaterally to auscultation without wheezes, rales, or rhonchi. Breathing is unlabored. Heart: RRR with S1 S2. No murmurs, rubs, or gallops appreciated. Abdomen: Soft, non-tender, non-distended with normoactive bowel sounds. No hepatomegaly. No rebound/guarding. No obvious abdominal masses. Msk:  Strength and tone appears normal for age. Extremities: No clubbing, cyanosis or edema.  Distal pedal pulses are 2+ and equal bilaterally. Neuro: Alert and oriented X 3. Moves all extremities spontaneously. Psych:  Responds to questions appropriately with a normal affect.  Labs: No results found for  this or any previous visit (from the past 24 hour(s)).  Radiology/Studies: No results found.  Tele:: SR  ASSESSMENT AND PLAN: Plan TEE   Signed, Dietrich Pates 10/10/2011, 12:43 PM

## 2011-10-11 ENCOUNTER — Ambulatory Visit (HOSPITAL_COMMUNITY): Payer: BC Managed Care – PPO | Admitting: *Deleted

## 2011-10-11 ENCOUNTER — Ambulatory Visit (HOSPITAL_COMMUNITY)
Admission: RE | Admit: 2011-10-11 | Discharge: 2011-10-12 | Disposition: A | Discharge status: Home / Self Care | Payer: BC Managed Care – PPO | Source: Ambulatory Visit | Attending: Internal Medicine | Admitting: Internal Medicine

## 2011-10-11 ENCOUNTER — Encounter (HOSPITAL_COMMUNITY): Payer: Self-pay | Admitting: *Deleted

## 2011-10-11 ENCOUNTER — Encounter (HOSPITAL_COMMUNITY)
Admission: RE | Disposition: A | Discharge status: Home / Self Care | Payer: Self-pay | Source: Ambulatory Visit | Attending: Internal Medicine

## 2011-10-11 ENCOUNTER — Encounter (HOSPITAL_COMMUNITY): Payer: Self-pay | Admitting: Internal Medicine

## 2011-10-11 DIAGNOSIS — I10 Essential (primary) hypertension: Secondary | ICD-10-CM | POA: Insufficient documentation

## 2011-10-11 DIAGNOSIS — I059 Rheumatic mitral valve disease, unspecified: Secondary | ICD-10-CM | POA: Insufficient documentation

## 2011-10-11 DIAGNOSIS — I428 Other cardiomyopathies: Secondary | ICD-10-CM | POA: Insufficient documentation

## 2011-10-11 DIAGNOSIS — E785 Hyperlipidemia, unspecified: Secondary | ICD-10-CM | POA: Insufficient documentation

## 2011-10-11 DIAGNOSIS — I2589 Other forms of chronic ischemic heart disease: Secondary | ICD-10-CM | POA: Insufficient documentation

## 2011-10-11 DIAGNOSIS — I4892 Unspecified atrial flutter: Secondary | ICD-10-CM

## 2011-10-11 DIAGNOSIS — I519 Heart disease, unspecified: Secondary | ICD-10-CM | POA: Insufficient documentation

## 2011-10-11 DIAGNOSIS — I4819 Other persistent atrial fibrillation: Secondary | ICD-10-CM | POA: Insufficient documentation

## 2011-10-11 DIAGNOSIS — I4891 Unspecified atrial fibrillation: Secondary | ICD-10-CM

## 2011-10-11 HISTORY — PX: ATRIAL FIBRILLATION ABLATION: SHX5456

## 2011-10-11 LAB — PROTIME-INR: INR: 2.59 — ABNORMAL HIGH (ref 0.00–1.49)

## 2011-10-11 SURGERY — ATRIAL FIBRILLATION ABLATION
Anesthesia: Monitor Anesthesia Care

## 2011-10-11 MED ORDER — SODIUM CHLORIDE 0.9 % IV SOLN
INTRAVENOUS | Status: DC | PRN
  Administered 2011-10-11: 13:00:00 via INTRAVENOUS

## 2011-10-11 MED ORDER — PROPOFOL 10 MG/ML IV BOLUS
INTRAVENOUS | Status: DC | PRN
  Administered 2011-10-11: 180 mg via INTRAVENOUS

## 2011-10-11 MED ORDER — HEPARIN SODIUM (PORCINE) 1000 UNIT/ML IJ SOLN
INTRAMUSCULAR | Status: DC | PRN
  Administered 2011-10-11: 12000 [IU] via INTRAVENOUS
  Administered 2011-10-11: 3000 [IU] via INTRAVENOUS
  Administered 2011-10-11: 5000 [IU] via INTRAVENOUS

## 2011-10-11 MED ORDER — SODIUM CHLORIDE 0.9 % IV SOLN: 250.0000 mL | INTRAVENOUS | Status: DC | PRN

## 2011-10-11 MED ORDER — SODIUM CHLORIDE 0.45 % IV SOLN
INTRAVENOUS | Status: DC
  Administered 2011-10-11: 11:00:00 via INTRAVENOUS

## 2011-10-11 MED ORDER — ACETAMINOPHEN 325 MG PO TABS: 650.0000 mg | ORAL_TABLET | ORAL | Status: DC | PRN

## 2011-10-11 MED ORDER — ONDANSETRON HCL 4 MG/2ML IJ SOLN: 4.0000 mg | Freq: Four times a day (QID) | INTRAMUSCULAR | Status: DC | PRN

## 2011-10-11 MED ORDER — WARFARIN - PHYSICIAN DOSING INPATIENT: Freq: Every day | Status: DC

## 2011-10-11 MED ORDER — HYDROCODONE-ACETAMINOPHEN 5-325 MG PO TABS: 1.0000 | ORAL_TABLET | ORAL | Status: DC | PRN

## 2011-10-11 MED ORDER — BENAZEPRIL HCL 10 MG PO TABS
10.0000 mg | ORAL_TABLET | Freq: Every day | ORAL | Status: DC
  Administered 2011-10-12: 10 mg via ORAL
  Filled 2011-10-11: qty 1

## 2011-10-11 MED ORDER — PROTAMINE SULFATE 10 MG/ML IV SOLN
INTRAVENOUS | Status: DC | PRN
  Administered 2011-10-11: 30 mg via INTRAVENOUS

## 2011-10-11 MED ORDER — EPHEDRINE SULFATE 50 MG/ML IJ SOLN
INTRAMUSCULAR | Status: DC | PRN
  Administered 2011-10-11: 10 mg via INTRAVENOUS
  Administered 2011-10-11: 5 mg via INTRAVENOUS
  Administered 2011-10-11 (×2): 10 mg via INTRAVENOUS
  Administered 2011-10-11: 15 mg via INTRAVENOUS
  Administered 2011-10-11: 10 mg via INTRAVENOUS

## 2011-10-11 MED ORDER — WARFARIN SODIUM 5 MG PO TABS
5.0000 mg | ORAL_TABLET | Freq: Every day | ORAL | Status: DC
  Administered 2011-10-11: 5 mg via ORAL
  Filled 2011-10-11 (×2): qty 1

## 2011-10-11 MED ORDER — SODIUM CHLORIDE 0.9 % IJ SOLN: 3.0000 mL | Freq: Two times a day (BID) | INTRAMUSCULAR | Status: DC

## 2011-10-11 MED ORDER — SODIUM CHLORIDE 0.9 % IJ SOLN: 3.0000 mL | INTRAMUSCULAR | Status: DC | PRN

## 2011-10-11 MED ORDER — FENTANYL CITRATE 0.05 MG/ML IJ SOLN
INTRAMUSCULAR | Status: DC | PRN
  Administered 2011-10-11 (×2): 50 ug via INTRAVENOUS
  Administered 2011-10-11: 100 ug via INTRAVENOUS
  Administered 2011-10-11: 50 ug via INTRAVENOUS

## 2011-10-11 MED ORDER — MIDAZOLAM HCL 5 MG/5ML IJ SOLN
INTRAMUSCULAR | Status: DC | PRN
  Administered 2011-10-11: 2 mg via INTRAVENOUS

## 2011-10-11 MED ORDER — HEPARIN SODIUM (PORCINE) 1000 UNIT/ML IJ SOLN
INTRAMUSCULAR | Status: AC
  Filled 2011-10-11: qty 1

## 2011-10-11 MED ORDER — DOFETILIDE 500 MCG PO CAPS
500.0000 ug | ORAL_CAPSULE | Freq: Two times a day (BID) | ORAL | Status: DC
  Administered 2011-10-11 – 2011-10-12 (×2): 500 ug via ORAL
  Filled 2011-10-11 (×3): qty 1

## 2011-10-11 MED ORDER — BUPIVACAINE HCL (PF) 0.25 % IJ SOLN
INTRAMUSCULAR | Status: AC
  Filled 2011-10-11: qty 30

## 2011-10-11 MED ORDER — SODIUM CHLORIDE 0.9 % IJ SOLN
3.0000 mL | Freq: Two times a day (BID) | INTRAMUSCULAR | Status: DC
  Administered 2011-10-11: 3 mL via INTRAVENOUS

## 2011-10-11 NOTE — Progress Notes (Signed)
Called into patient's room pt said he felt "trickling down my leg" and that his right groin site was bleeding. Dressing removed was clean, dry and intact but the gauze was not on top of the puncture sites but placed proximally to the sites. Site appeared to have oozed some, approx. 10cc of blood had trickled down patients inner groin area and was almost dry. Puncture site appeared to have a very small amount of blood when a clean gauze was dabbed on the site. Pressure held for about 5 minutes and oozing appeared to have stopped. Unsure if small hematoma was palpable at site or if this was his normal anatomy and palpation on patient's left groin felt similar to the right. It felt unchanged after pressure was held. New dressing applied to correct location and told patient he should remain on bedrest another 6 hours, so no getting up until morning. Patient understood. Will cont to monitor.

## 2011-10-11 NOTE — H&P (View-Only) (Signed)
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  He was seen by me in 2011.  At that time, he declined afib ablation.  He has subsequently been treated with tikosyn but continues to have paroxysms of afib.  He has not recently required cardioversion.  He continues to have episodes of afib with RVR however.  His EF has recently declined further to 20-25%.  He is felt by [name] to possibly have worsened EF related to afib.  He therefore presents today for further consideration of catheter ablation. He is clearly symptomatic with afib.  He reports symptoms of fatigue and decreased exercise tolerance. Today, he denies symptoms of palpitations, chest pain, shortness of breath (above baseline), orthopnea, PND, lower extremity edema, dizziness, presyncope, syncope, or neurologic sequela. The patient is tolerating medications without difficulties and is otherwise without complaint today.   Past Medical History  Diagnosis Date  . Persistent atrial fibrillation   . Nonischemic cardiomyopathy   . Dyslipidemia   . Keloid   . DJD (degenerative joint disease) of hip   . HTN (hypertension)   . Mild mitral regurgitation by prior echocardiogram   . Hx of adenomatous colonic polyps   . Hx of colonoscopy   . Chronic systolic dysfunction of left ventricle    Past Surgical History  Procedure Date  . Femoral hernia repair     at age 12    Current Outpatient Prescriptions  Medication Sig Dispense Refill  . benazepril (LOTENSIN) 10 MG tablet take 1 tablet by mouth once daily  90 tablet  2  . furosemide (LASIX) 40 MG tablet Take 40 mg by mouth as needed.       . metoprolol (LOPRESSOR) 100 MG tablet Take 1 tablet (100 mg total) by mouth 2 (two) times daily.  180 tablet  3  . potassium chloride (MICRO-K) 10 MEQ CR capsule take 1 capsule by mouth once daily  30  capsule  12  . simvastatin (ZOCOR) 40 MG tablet take 1 tablet by mouth once daily  90 tablet  2  . TIKOSYN 500 MCG capsule take 1 capsule by mouth twice a day  60 capsule  6  . warfarin (COUMADIN) 5 MG tablet Take as directed by Anticoagulation clinic  40 tablet  3    No Known Allergies  History   Social History  . Marital Status: Married    Spouse Name: N/A    Number of Children: 4  . Years of Education: N/A   Occupational History  . Autobody Doctor, hospital    Social History Main Topics  . Smoking status: Former Smoker    Quit date: 08/09/1999  . Smokeless tobacco: Not on file   Comment: quit 9 years ago  . Alcohol Use: No  . Drug Use: No  . Sexually Active: Not on file   Other Topics Concern  . Not on file   Social History Narrative  . No narrative on file    Family History  Problem Relation Age of Onset  . Brain cancer Mother   . Colon cancer      uncle  . Diabetes Sister   . Diabetes Sister   . Heart disease Neg Hx   . Breast cancer Sister   . Prostate cancer      uncle    ROS- All systems are reviewed and negative  except as per the HPI above  Physical Exam: Filed Vitals:   09/15/11 1529  BP: 96/62  Pulse: 43  Weight: 225 lb (102.059 kg)    GEN- The patient is well appearing, alert and oriented x 3 today.   Head- normocephalic, atraumatic Eyes-  Sclera clear, conjunctiva pink Ears- hearing intact Oropharynx- clear Neck- supple, no JVP Lymph- no cervical lymphadenopathy Lungs- Clear to ausculation bilaterally, normal work of breathing Heart- Regular rate and rhythm, no murmurs, rubs or gallops, PMI not laterally displaced GI- soft, NT, ND, + BS Extremities- no clubbing, cyanosis, or edema MS- no significant deformity or atrophy Skin- no rash or lesion Psych- euthymic mood, full affect Neuro- strength and sensation are intact  EKG today reveals sinus rhythm 45 bpm, PR 170, QRS 96, Qtc 417  Assessment and Plan:

## 2011-10-11 NOTE — Op Note (Signed)
SURGEON:  Hillis Range, MD  PREPROCEDURE DIAGNOSES: 1. Paroxysmal atrial fibrillation.  POSTPROCEDURE DIAGNOSES: 1. Paroxysmal  atrial fibrillation. 2. Right atrial flutter  PROCEDURES: 1. Comprehensive electrophysiologic study. 2. Coronary sinus pacing and recording. 3. Three-dimensional mapping of supraventricular tachycardia (afib and atrial flutter). 4. Ablation of supraventricular tachycardia. (afib and atrial flutter). 5. Arterial blood pressure monitoring. 6. Intracardiac echocardiography. 7. Transseptal puncture of an intact septum. 8.  Rotational Angiography with processing at an independent workstation   INTRODUCTION:  Edward Pittman is a 71 y.o. male with a history of paroxysmal atrial fibrillation  who now presents for EP study and radiofrequency ablation.  The patient reports initially being diagnosed with atrial fibrillation several years ago.  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  The patient reports increasing frequency and duration of atrial arrhythmias since that time.  The patient has failed medical therapy with Joice Lofts.  The patient therefore presents today for catheter ablation of atrial fibrillation.  DESCRIPTION OF PROCEDURE:  Informed written consent was obtained, and the patient was brought to the electrophysiology lab in a fasting state.  The patient was adequately sedated with intravenous medications as outlined in the anesthesia report.  The patient's left and right groins were prepped and draped in the usual sterile fashion by the EP lab staff.  Using a percutaneous Seldinger technique, two 7-French and one 8-French hemostasis sheaths were placed into the right common femoral vein.  A 4- [place] hemostasis sheath was placed in the right common femoral artery for blood pressure monitoring.  An 11-French hemostasis sheath was placed into the left common femoral vein.   3 Dimensional Rotational Angiography: A 5 french pigtail  catheter was introduced through the right common femoral vein and advanced into the inferior venocava.  3 demential rotational angiography was then performed by power injection of 100cc of nonionic contrast.  Reprocessing at an independent work station was then performed.   This demonstrated a large sized left atrium with 4 separate pulmonary veins which were also moderate in size.  There were no anomalous veins or significant abnormalities.  A 3 dimensional rendering of the left atrium was then merged using [employer] onto the Well[employer] system and registered with intracardiac echo (see below).  The pigtail catheter was then removed.  Catheter Placement:  A 7-French Biosense Webster Decapolar coronary sinus catheter was introduced through the right common femoral vein and advanced into the coronary sinus for recording and pacing from this location.  A 6-French quadripolar Josephson catheter was introduced through the right common femoral vein and advanced into the right ventricle for recording and pacing.  This catheter was then pulled back to the His bundle location.    Initial Measurements: The patient presented to the electrophysiology lab in sinus rhythm.  His PR interval measured 112 with a QRS duringation of 74 and a QT interval of 481 msec.  The AH interval measured 92 and the HV interval measured 48 msec.     Intracardiac Echocardiography: A 10-French Biosense Webster AcuNav intracardiac echocardiography catheter was introduced through the left common femoral vein and advanced into the right atrium. Intracardiac echocardiography was performed of the left atrium, and a three-dimensional anatomical rendering of the left atrium was performed using CARTO sound technology.  The patient was noted to have a large sized left atrium.  The interatrial septum was prominent but not aneurysmal. All 4 pulmonary veins were visualized and noted to have separate ostia.  The pulmonary  veins were  moderate in size.  The left atrial appendage was visualized and did not reveal thrombus.   There was no evidence of pulmonary vein stenosis.   Transseptal Puncture: The middle right common femoral vein sheath was exchanged for an 8.5 [place] SL2 transseptal sheath and transseptal access was achieved in a standard fashion using a Brockenbrough needle under biplane fluoroscopy with intracardiac echocardiography confirmation of the transseptal puncture.  Once transseptal access had been achieved, heparin was administered intravenously and intra- arterially in order to maintain an ACT of greater than 350 seconds throughout the procedure.    3D Mapping and Ablation: The His bundle catheter was removed and in its place a 3.5 mm Biosense Webster EZ [employer] ablation catheter was advanced into the right atrium.  The transseptal sheath was pulled back into the IVC over a guidewire.  The ablation catheter was advanced across the transseptal hole using the wire as a guide.  The transseptal sheath was then re-advanced over the guidewire into the left atrium.  A duodecapolar Biosense Webster circular mapping catheter was introduced through the transseptal sheath and positioned over the mouth of all 4 pulmonary veins.  Three-dimensional electroanatomical mapping was performed using CARTO technology.  This demonstrated electrical activity within all four pulmonary veins at baseline. The patient underwent successful sequential electrical isolation and anatomical encircling of all four pulmonary veins using radiofrequency current with a circular mapping catheter as a guide.  During ablation within the right superior pulmonary vein, the patient developed sustained right atrial flutter at .  This tachycardia terminated before it could be mapped fully.  The surface EKG was consistent with typical appearing atrial flutter. The ablation catheter was then pulled back into the right atrial and positioned along the  cavo-tricuspid isthmus.  Mapping along the atrial side of the isthmus was performed.  This demonstrated a standard isthmus.  A series of radiofrequency applications were then delivered along the isthmus.  Isthmus block was achieved as confirmed by differential atrial pacing from the low lateral right atrium.  The patient was observe without return of conduction through the isthmus.  The circular mapping catheter was pulled back into the right atrium and 3D mapping was performed at the junction of the superior vena cava and right atrium.  Electrical activity was observed within the SVC.  I therefore elected to perform right atrial ablation in this area.  A series of radiofrequency applications were delivered in a circular fashion around the ostium of the SVC.  Prior to each ablation lesions, pacing was performed from the distal ablation electrode to insure that diaphragmatic stimulation was not observed to avoid phrenic nerve injury.  Diaphragmatic excursion was also observed during ablation.     Measurements Following Ablation: In sinus rhythm with RR interval was 1369, with PR 160 msec, QRS 77 msec, and Qtc 481 msec.  Following ablation the AH interval measured with an HV interval of 40 msec. Ventricular pacing was performed, which revealed midline decremental VA conduction with a VA Wenckebach cycle length of 410 msec.  Rapid atrial pacing was performed, which revealed an AV Wenckebach cycle length of 340 msec.  Electroisolation was then again confirmed in all four pulmonary veins.  Intracardiac echocardiography was again performed, which revealed no pericardial effusion.  The procedure was therefore considered completed.  All catheters were removed, and the sheaths were aspirated and flushed.  The patient was transferred to the recovery area for sheath removal per protocol.  A limited bedside transthoracic echocardiogram  revealed no pericardial effusion.  There were no early apparent  complications.  CONCLUSIONS: 1. Sinus rhythm upon presentation.   2. Rotational Angiography reveals a large sized left atrium with four separate pulmonary veins without evidence of pulmonary vein stenosis. 3. Successful electrical isolation and anatomical encircling of all four pulmonary veins with radiofrequency current. 4. Cavo-tricuspid isthmus ablation was performed with isthmus block achieved.  5. Anatomical encircling of the SVC using RF 6. No inducible arrhythmias following ablation both on and off of Isuprel 7. No early apparent complications.   Jennalyn Cawley,MD 4:15 PM 10/11/2011

## 2011-10-11 NOTE — Interval H&P Note (Signed)
History and Physical Interval Note:  10/11/2011 12:27 PM  Edward Pittman  has presented today for surgery, with the diagnosis of AFib  The various methods of treatment have been discussed with the patient and family. After consideration of risks, benefits and other options for treatment, the patient has consented to  Procedure(s) (LRB): ATRIAL FIBRILLATION ABLATION (N/A) as a surgical intervention .  The patients' history has been reviewed, patient examined, no change in status, stable for surgery.  I have reviewed the patients' chart and labs.  Questions were answered to the patient's satisfaction.     Hillis Range

## 2011-10-11 NOTE — Preoperative (Signed)
Beta Blockers   Reason not to administer Beta Blockers:Not Applicable 

## 2011-10-11 NOTE — Transfer of Care (Signed)
Immediate Anesthesia Transfer of Care Note  Patient: Edward Pittman  Procedure(s) Performed: Procedure(s) (LRB): ATRIAL FIBRILLATION ABLATION (N/A)  Patient Location: cath lab  Anesthesia Type: General  Level of Consciousness: awake  Airway & Oxygen Therapy: Patient Spontanous Breathing and Patient connected to nasal cannula oxygen  Post-op Assessment: Report given to PACU RN and Post -op Vital signs reviewed and stable  Post vital signs: Reviewed and stable  Complications: No apparent anesthesia complications

## 2011-10-11 NOTE — Anesthesia Preprocedure Evaluation (Addendum)
Anesthesia Evaluation  Patient identified by MRN, date of birth, ID band Patient awake    Reviewed: Allergy & Precautions, H&P , NPO status , Patient's Chart, lab work & pertinent test results  History of Anesthesia Complications Negative for: history of anesthetic complications  Airway Mallampati: III TM Distance: >3 FB     Dental  (+) Dental Advisory Given, Chipped and Teeth Intact   Pulmonary neg pulmonary ROS,  breath sounds clear to auscultation        Cardiovascular hypertension, + dysrhythmias Atrial Fibrillation Rhythm:Irregular Rate:Normal     Neuro/Psych negative neurological ROS     GI/Hepatic negative GI ROS, Neg liver ROS,   Endo/Other  negative endocrine ROS  Renal/GU negative Renal ROS  negative genitourinary   Musculoskeletal negative musculoskeletal ROS (+)   Abdominal   Peds  Hematology negative hematology ROS (+)   Anesthesia Other Findings   Reproductive/Obstetrics                          Anesthesia Physical Anesthesia Plan  ASA: III  Anesthesia Plan: General   Post-op Pain Management:    Induction: Intravenous  Airway Management Planned: LMA  Additional Equipment:   Intra-op Plan:   Post-operative Plan:   Informed Consent: I have reviewed the patients History and Physical, chart, labs and discussed the procedure including the risks, benefits and alternatives for the proposed anesthesia with the patient or authorized representative who has indicated his/her understanding and acceptance.   Dental advisory given  Plan Discussed with: Anesthesiologist, CRNA and Surgeon  Anesthesia Plan Comments: (Afib Nonischemic cardiomyopathy Htn  Plan GA  Kipp Brood, MD)       Anesthesia Quick Evaluation

## 2011-10-11 NOTE — Anesthesia Postprocedure Evaluation (Signed)
Anesthesia Post Note  Patient: Edward Pittman  Procedure(s) Performed: Procedure(s) (LRB): ATRIAL FIBRILLATION ABLATION (N/A)  Anesthesia type: general  Patient location: PACU  Post pain: Pain level controlled  Post assessment: Patient's Cardiovascular Status Stable  Last Vitals:  Filed Vitals:   10/11/11 1042  BP: 141/94  Pulse: 87  Temp: 36.2 C  Resp: 20    Post vital signs: Reviewed and stable  Level of consciousness: sedated  Complications: No apparent anesthesia complications

## 2011-10-12 DIAGNOSIS — I4891 Unspecified atrial fibrillation: Secondary | ICD-10-CM

## 2011-10-12 LAB — POCT ACTIVATED CLOTTING TIME
Activated Clotting Time: 199 seconds
Activated Clotting Time: 193 seconds

## 2011-10-12 LAB — BASIC METABOLIC PANEL
CO2: 25 mEq/L (ref 19–32)
Calcium: 8.8 mg/dL (ref 8.4–10.5)
Creatinine, Ser: 0.88 mg/dL (ref 0.50–1.35)
Glucose, Bld: 128 mg/dL — ABNORMAL HIGH (ref 70–99)

## 2011-10-12 MED ORDER — PANTOPRAZOLE SODIUM 40 MG PO TBEC: 40.0000 mg | DELAYED_RELEASE_TABLET | Freq: Every day | ORAL | Status: DC

## 2011-10-12 NOTE — Discharge Summary (Signed)
ELECTROPHYSIOLOGY PROCEDURE DISCHARGE SUMMARY    Patient ID: Edward Pittman,  MRN: [id number], DOB/AGE: [birth year] 71 y.o.  Admit date: 10/11/2011 Discharge date: 10/12/2011  Primary Care Physician: Illene Regulus, MD Primary Cardiologist: [name], MD Electrophysiologist: Hillis Range, MD  Primary Discharge Diagnosis:  Atrial fibrillation status post ablation 10-11-2011  Secondary Discharge Diagnosis:  1.  Non-ischemic cardiomyopathy 2.  Dyslipidemia 3.  Keloid 4.  DJD of hip 5.  Hypertension 6.  Mild mitral regurgitation by echo 7.  Hx of adenomatous colonic polyps 8.  Chronic systolic dysfunction of left ventricle   Procedures This Admission:  1.  Electrophysiology study and radiofrequency catheter ablation of atrial fibrillation on 10-11-2011 by Dr Johney Frame.  This study demonstrated sinus rhythm upon presentation, successful electrical isolation and anatomical encircling of all four pulmonary disease.  Cavo-tricuspid ablation was also performed with isthmus block achieved.  There were no inducible arrhythmias following ablation and no early apparent complications.   Brief HPI: Edward Pittman is a 71 y.o. male with a h/o persistent atrial fibrillation who presents today for EP follow-up. He was seen by me in 2011. At that time, he declined afib ablation. He has subsequently been treated with tikosyn but continues to have paroxysms of afib. He has not recently required cardioversion. He continues to have episodes of afib with RVR however. His EF has recently declined further to 20-25%. He is felt by Dr [name] to possibly have worsened EF related to afib. He therefore presents today for further consideration of catheter ablation.  He is clearly symptomatic with afib. He reports symptoms of fatigue and decreased exercise tolerance.  Risks, benefits and alternatives of ablation were reviewed with the patient and he wished to proceed.    Hospital Course:  Edward Pittman  was admitted on 4-16 for planned ablation of atrial fibrillation.  This was carried out by Dr Johney Frame as outlined above.  He was monitored on telemetry overnight which demonstrated sinus rhythm.  He was evaluated by Dr Johney Frame on 4-17 and considered stable for discharge to home.   Discharge Vitals: Blood pressure 133/68, pulse 57, temperature 98.1 F (36.7 C), temperature source Oral, resp. rate 13, height 6\' 1"  (1.854 m), weight 231 lb 7.7 oz (105 kg), SpO2 100.00%.    Labs:   Lab Results  Component Value Date   WBC 7.6 10/05/2011   HGB 14.8 10/05/2011   HCT 44.1 10/05/2011   MCV 96.7 10/05/2011   PLT 153.0 10/05/2011     Lab 10/12/11 0522  NA 137  K 3.9  CL 102  CO2 25  BUN 12  CREATININE 0.88  CALCIUM 8.8  PROT --  BILITOT --  ALKPHOS --  ALT --  AST --  GLUCOSE 128*    Discharge Medications:  Medication List  As of 10/12/2011 10:31 AM   TAKE these medications         benazepril 10 MG tablet   Commonly known as: LOTENSIN   Take 10 mg by mouth daily.      dofetilide 500 MCG capsule   Commonly known as: TIKOSYN   Take 500 mcg by mouth 2 (two) times daily.      fish oil-omega-3 fatty acids 1000 MG capsule   Take 1 g by mouth once a week.      furosemide 40 MG tablet   Commonly known as: LASIX   Take 40 mg by mouth 2 (two) times daily as needed. For swelling      metoprolol  100 MG tablet   Commonly known as: LOPRESSOR   Take 1 tablet (100 mg total) by mouth 2 (two) times daily.      pantoprazole 40 MG tablet   Commonly known as: PROTONIX   Take 1 tablet (40 mg total) by mouth daily.      potassium chloride 10 MEQ CR capsule   Commonly known as: MICRO-K   Take 10 mEq by mouth daily.      simvastatin 40 MG tablet   Commonly known as: ZOCOR   Take 40 mg by mouth every evening.      warfarin 5 MG tablet   Commonly known as: COUMADIN   Take 5 mg by mouth daily.            Disposition:  Discharge Orders    Future Appointments: Provider: Department: Dept  Phone: Center:   10/18/2011 7:45 AM [facility] [phone] None   01/11/2012 3:00 PM Hillis Range, MD [facility] [phone] [facility]     Future Orders Please Complete By Expires   Diet - low sodium heart healthy      Increase activity slowly      Comments:   No driving for 2 days. No lifting over 5 lbs for 1 week. No sexual activity for 1 week. Keep procedure site clean & dry. If you notice increased pain, swelling, bleeding or pus, call/return!  You may shower, but no soaking baths/hot tubs/pools for 1 week.       Follow-up Information    Follow up with [facility]. ([facility] Clinic 10/18/11 at 7:45am)    Contact information:   [address] [place] [address]       Follow up with Hillis Range, MD. (01/11/12 at 3pm)    Contact information:   [address] [place] [address] [phone]          Duration of Discharge Encounter: Greater than 30 minutes including physician time.  Signed, Gypsy Balsam, RN, BSN 10/12/2011, 10:31 AM  I have seen, examined the patient, and reviewed the above assessment and plan.   Co Sign: Hillis Range, MD 10/13/2011 12:26 PM

## 2011-10-12 NOTE — Progress Notes (Signed)
SUBJECTIVE: The patient is doing well today.  At this time, he denies chest pain, shortness of breath, or any new concerns.     . benazepril  10 mg Oral Daily  . bupivacaine      . dofetilide  500 mcg Oral BID  . heparin      . sodium chloride  3 mL Intravenous Q12H  . warfarin  5 mg Oral q1800  . Warfarin - Physician Dosing Inpatient   Does not apply q1800  . DISCONTD: sodium chloride  3 mL Intravenous Q12H      . DISCONTD: sodium chloride 10 mL/hr at 10/11/11 1042    OBJECTIVE: Physical Exam: Filed Vitals:   10/12/11 0300 10/12/11 0400 10/12/11 0715 10/12/11 0756  BP:  129/65 133/68   Pulse: 55 57 57   Temp:  98.7 F (37.1 C)  98.1 F (36.7 C)  TempSrc:  Oral  Oral  Resp: 23 19 13    Height:      Weight:      SpO2: 98% 98% 100%     Intake/Output Summary (Last 24 hours) at 10/12/11 0837 Last data filed at 10/12/11 0200  Gross per 24 hour  Intake   1300 ml  Output    825 ml  Net    475 ml    Telemetry reveals sinus rhythm  GEN- The patient is well appearing, alert and oriented x 3 today.   Head- normocephalic, atraumatic Eyes-  Sclera clear, conjunctiva pink Ears- hearing intact Oropharynx- clear Neck- supple, no JVP Lymph- no cervical lymphadenopathy Lungs- Clear to ausculation bilaterally, normal work of breathing Heart- Regular rate and rhythm, no murmurs, rubs or gallops, PMI not laterally displaced GI- soft, NT, ND, + BS Extremities- no clubbing, cyanosis, or edema, no bruit or hematoma No focal neuro changes  LABS: Basic Metabolic Panel:  Basename 10/12/11 0522  NA 137  K 3.9  CL 102  CO2 25  GLUCOSE 128*  BUN 12  CREATININE 0.88  CALCIUM 8.8  MG --  PHOS --  Anemia Panel: No results found for this basename: VITAMINB12,FOLATE,FERRITIN,TIBC,IRON,RETICCTPCT in the last 72 hours  RADIOLOGY: No results found.  ASSESSMENT AND PLAN:  Active Problems:  HYPERTENSION  CARDIOMYOPATHY  Persistent atrial fibrillation  1. Afib- doing well  s/p ablation Resume home medicines including tikosyn and coumadin Add protonic 40mg  daily x 6 weeks  DC to home today Follow-up with me in 12 weeks   Hillis Range, MD 10/12/2011 8:37 AM

## 2011-10-12 NOTE — Progress Notes (Signed)
DISCHARGED HOME ACCOMPANIED BY WIFE, STABLE, DISCHARGE INSTRUCTIONS GIVEN TO PT. BELONGINGS WITH PT.

## 2011-10-18 ENCOUNTER — Ambulatory Visit (INDEPENDENT_AMBULATORY_CARE_PROVIDER_SITE_OTHER): Payer: BC Managed Care – PPO | Admitting: *Deleted

## 2011-10-18 DIAGNOSIS — I4891 Unspecified atrial fibrillation: Secondary | ICD-10-CM

## 2011-10-18 DIAGNOSIS — I4819 Other persistent atrial fibrillation: Secondary | ICD-10-CM

## 2011-10-23 ENCOUNTER — Encounter: Payer: Self-pay | Admitting: Internal Medicine

## 2011-11-01 ENCOUNTER — Ambulatory Visit (INDEPENDENT_AMBULATORY_CARE_PROVIDER_SITE_OTHER): Payer: BC Managed Care – PPO

## 2011-11-01 DIAGNOSIS — I4891 Unspecified atrial fibrillation: Secondary | ICD-10-CM

## 2011-11-01 DIAGNOSIS — I4819 Other persistent atrial fibrillation: Secondary | ICD-10-CM

## 2011-11-01 LAB — POCT INR: INR: 5.3

## 2011-11-11 ENCOUNTER — Ambulatory Visit (INDEPENDENT_AMBULATORY_CARE_PROVIDER_SITE_OTHER): Payer: BC Managed Care – PPO | Admitting: Pharmacist

## 2011-11-11 DIAGNOSIS — I4819 Other persistent atrial fibrillation: Secondary | ICD-10-CM

## 2011-11-11 DIAGNOSIS — I4891 Unspecified atrial fibrillation: Secondary | ICD-10-CM

## 2011-11-25 ENCOUNTER — Ambulatory Visit (INDEPENDENT_AMBULATORY_CARE_PROVIDER_SITE_OTHER): Payer: BC Managed Care – PPO

## 2011-11-25 DIAGNOSIS — I4891 Unspecified atrial fibrillation: Secondary | ICD-10-CM

## 2011-11-25 DIAGNOSIS — I4819 Other persistent atrial fibrillation: Secondary | ICD-10-CM

## 2011-11-28 ENCOUNTER — Telehealth: Payer: Self-pay | Admitting: Cardiology

## 2011-11-28 MED ORDER — SIMVASTATIN 40 MG PO TABS: 40.0000 mg | ORAL_TABLET | Freq: Every evening | ORAL | Status: DC

## 2011-11-28 NOTE — Telephone Encounter (Signed)
New Problem:    Patient called in needing a refill of his simvastatin (ZOCOR) 40 MG tablet.

## 2011-12-16 ENCOUNTER — Ambulatory Visit (INDEPENDENT_AMBULATORY_CARE_PROVIDER_SITE_OTHER): Payer: BC Managed Care – PPO | Admitting: *Deleted

## 2011-12-16 DIAGNOSIS — I4819 Other persistent atrial fibrillation: Secondary | ICD-10-CM

## 2011-12-16 DIAGNOSIS — I4891 Unspecified atrial fibrillation: Secondary | ICD-10-CM

## 2011-12-22 ENCOUNTER — Ambulatory Visit (INDEPENDENT_AMBULATORY_CARE_PROVIDER_SITE_OTHER): Payer: BC Managed Care – PPO | Admitting: Cardiology

## 2011-12-22 ENCOUNTER — Encounter: Payer: Self-pay | Admitting: Cardiology

## 2011-12-22 VITALS — BP 140/80 | HR 72 | Ht 73.0 in | Wt 238.0 lb

## 2011-12-22 DIAGNOSIS — I080 Rheumatic disorders of both mitral and aortic valves: Secondary | ICD-10-CM

## 2011-12-22 DIAGNOSIS — I4891 Unspecified atrial fibrillation: Secondary | ICD-10-CM

## 2011-12-22 DIAGNOSIS — I4819 Other persistent atrial fibrillation: Secondary | ICD-10-CM

## 2011-12-22 DIAGNOSIS — I428 Other cardiomyopathies: Secondary | ICD-10-CM

## 2011-12-22 NOTE — Patient Instructions (Addendum)
The current medical regimen is effective;  continue present plan and medications.  Your physician has requested that you have an echocardiogram in September. Echocardiography is a painless test that uses sound waves to create images of your heart. It provides your doctor with information about the size and shape of your heart and how well your heart's chambers and valves are working. This procedure takes approximately one hour. There are no restrictions for this procedure.  Follow up with Dr [name] in October

## 2011-12-22 NOTE — Assessment & Plan Note (Signed)
I will repeat a TTE in Oct.  He will remain on the meds as listed.  He seems to be euvolemic.

## 2011-12-22 NOTE — Progress Notes (Signed)
   HPI The patient presents for follow up of atrial fibrillation.  He  He is status post ablation on 4/16.  Since then he has had some paroxysms. However, these are much less frequent. Overall he feels better. Of note I reviewed his TEE done at the time of his procedure and his EF was reported to be normal. This would be much different than the transthoracic ordered earlier in the year. The patient denies any new symptoms such as chest discomfort, neck or arm discomfort. There has been no new shortness of breath, PND or orthopnea. There have been no reported presyncope or syncope.   No Known Allergies  Current Outpatient Prescriptions  Medication Sig Dispense Refill  . benazepril (LOTENSIN) 10 MG tablet Take 10 mg by mouth daily.      Marland Kitchen dofetilide (TIKOSYN) 500 MCG capsule Take 500 mcg by mouth 2 (two) times daily.      . fish oil-omega-3 fatty acids 1000 MG capsule Take 1 g by mouth once a week.      . furosemide (LASIX) 40 MG tablet Take 40 mg by mouth 2 (two) times daily as needed. For swelling      . metoprolol (LOPRESSOR) 100 MG tablet Take 1 tablet (100 mg total) by mouth 2 (two) times daily.  180 tablet  3  . potassium chloride (MICRO-K) 10 MEQ CR capsule Take 10 mEq by mouth daily.      . simvastatin (ZOCOR) 40 MG tablet Take 1 tablet (40 mg total) by mouth every evening.  30 tablet  3  . warfarin (COUMADIN) 5 MG tablet Take 5 mg by mouth daily.        Past Medical History  Diagnosis Date  . Persistent atrial fibrillation     s/p ablation 4/16  Dr. Johney Frame  . Nonischemic cardiomyopathy   . Dyslipidemia   . Keloid   . DJD (degenerative joint disease) of hip   . HTN (hypertension)   . Mild mitral regurgitation by prior echocardiogram   . Hx of adenomatous colonic polyps   . Hx of colonoscopy   . Chronic systolic dysfunction of left ventricle     Past Surgical History  Procedure Date  . Femoral hernia repair     at age 51  . Tee without cardioversion 10/10/2011    Procedure:  TRANSESOPHAGEAL ECHOCARDIOGRAM (TEE);  Surgeon: Pricilla Riffle, MD;  Location: [facility] ENDOSCOPY;  Service: Cardiovascular;  Laterality: N/A;  . Hernia repair     ROS:  As stated in the HPI and negative for all other systems.  PHYSICAL EXAM BP 140/80  Pulse 72  Ht 6\' 1"  (1.854 m)  Wt 238 lb (107.956 kg)  BMI 31.40 kg/m2 GENERAL:  Well appearing NECK:  No jugular venous distention, waveform within normal limits, carotid upstroke brisk and symmetric, no bruits, no thyromegaly LUNGS:  Clear to auscultation bilaterally CHEST:  Unremarkable HEART:  PMI not displaced or sustained,S1 and S2 within normal limits, no S3, no S4, no clicks, no rubs, no murmurs, regular ABD:  Flat, positive bowel sounds normal in frequency in pitch, no bruits, no rebound, no guarding, no midline pulsatile mass, no hepatomegaly, no splenomegaly EXT:  2 plus pulses throughout, no edema, no cyanosis no clubbing   ASSESSMENT AND PLAN

## 2011-12-22 NOTE — Assessment & Plan Note (Signed)
He is post ablation and he is improved.  He will follow with Dr. Johney Frame in July.  He will continue the current therapy.

## 2012-01-11 ENCOUNTER — Ambulatory Visit (INDEPENDENT_AMBULATORY_CARE_PROVIDER_SITE_OTHER): Payer: BC Managed Care – PPO | Admitting: Internal Medicine

## 2012-01-11 ENCOUNTER — Encounter: Payer: Self-pay | Admitting: Internal Medicine

## 2012-01-11 ENCOUNTER — Ambulatory Visit (INDEPENDENT_AMBULATORY_CARE_PROVIDER_SITE_OTHER): Payer: BC Managed Care – PPO | Admitting: *Deleted

## 2012-01-11 VITALS — BP 106/60 | HR 43 | Resp 17 | Ht 73.0 in | Wt 234.0 lb

## 2012-01-11 DIAGNOSIS — I4891 Unspecified atrial fibrillation: Secondary | ICD-10-CM

## 2012-01-11 DIAGNOSIS — I4819 Other persistent atrial fibrillation: Secondary | ICD-10-CM

## 2012-01-11 DIAGNOSIS — I428 Other cardiomyopathies: Secondary | ICD-10-CM

## 2012-01-11 LAB — POCT INR: INR: 2.7

## 2012-01-11 NOTE — Assessment & Plan Note (Signed)
Echo in 3 months

## 2012-01-11 NOTE — Patient Instructions (Addendum)
Your physician recommends that you schedule a follow-up appointment in 3 months with Dr Allred    

## 2012-01-11 NOTE — Assessment & Plan Note (Signed)
Doing very well s/p ablation His afib is much improved  He has an echo planned in October with Dr [name]. If his afib remains controlled and his EF is improved, I would anticipate that we could stop tikosyn at that point. Continue anticoagulation.

## 2012-01-11 NOTE — Progress Notes (Signed)
PCP: Illene Regulus, MD Primary Cardiologist:  Dr Marquita Palms Szuch is a 71 y.o. male who presents today for routine electrophysiology followup.  Since his afib ablation, the patient reports doing very well.  He denies procedure related complications.  He has had some palpitations (ERAF) but feels that his afib is much improved.  He is pleased with the results of his procedure.  Today, he denies symptoms of chest pain, shortness of breath,  lower extremity edema, dizziness, presyncope, or syncope.  The patient is otherwise without complaint today.   Past Medical History  Diagnosis Date  . Persistent atrial fibrillation     s/p ablation 4/16  Dr. Johney Frame  . Nonischemic cardiomyopathy   . Dyslipidemia   . Keloid   . DJD (degenerative joint disease) of hip   . HTN (hypertension)   . Mild mitral regurgitation by prior echocardiogram   . Hx of adenomatous colonic polyps   . Hx of colonoscopy   . Chronic systolic dysfunction of left ventricle    Past Surgical History  Procedure Date  . Femoral hernia repair     at age 89  . Tee without cardioversion 10/10/2011    Procedure: TRANSESOPHAGEAL ECHOCARDIOGRAM (TEE);  Surgeon: Pricilla Riffle, MD;  Location: [facility] ENDOSCOPY;  Service: Cardiovascular;  Laterality: N/A;  . Hernia repair     Current Outpatient Prescriptions  Medication Sig Dispense Refill  . benazepril (LOTENSIN) 10 MG tablet Take 10 mg by mouth daily.      Marland Kitchen dofetilide (TIKOSYN) 500 MCG capsule Take 500 mcg by mouth 2 (two) times daily.      . fish oil-omega-3 fatty acids 1000 MG capsule Take 1 g by mouth once a week.      . furosemide (LASIX) 40 MG tablet Take 40 mg by mouth 2 (two) times daily as needed. For swelling      . metoprolol (LOPRESSOR) 100 MG tablet Take 1 tablet (100 mg total) by mouth 2 (two) times daily.  180 tablet  3  . potassium chloride (MICRO-K) 10 MEQ CR capsule Take 10 mEq by mouth daily.      . simvastatin (ZOCOR) 40 MG tablet Take 1 tablet (40 mg  total) by mouth every evening.  30 tablet  3  . warfarin (COUMADIN) 5 MG tablet Take 5 mg by mouth daily.        Physical Exam: Filed Vitals:   01/11/12 1533  BP: 106/60  Pulse: 43  Resp: 17  Height: 6\' 1"  (1.854 m)  Weight: 234 lb (106.142 kg)  SpO2: 97%    GEN- The patient is well appearing, alert and oriented x 3 today.   Head- normocephalic, atraumatic Eyes-  Sclera clear, conjunctiva pink Ears- hearing intact Oropharynx- clear Lungs- Clear to ausculation bilaterally, normal work of breathing Heart- Regular rate and rhythm, no murmurs, rubs or gallops, PMI not laterally displaced GI- soft, NT, ND, + BS Extremities- no clubbing, cyanosis, or edema  ekg today reveals sinus bradycardia 43 bpm, otherwise normal ekg, Qtc 419  Assessment and Plan:

## 2012-01-30 ENCOUNTER — Other Ambulatory Visit: Payer: Self-pay | Admitting: Cardiology

## 2012-01-30 NOTE — Telephone Encounter (Signed)
Please refill.

## 2012-02-24 ENCOUNTER — Ambulatory Visit (INDEPENDENT_AMBULATORY_CARE_PROVIDER_SITE_OTHER): Payer: BC Managed Care – PPO

## 2012-02-24 DIAGNOSIS — I4819 Other persistent atrial fibrillation: Secondary | ICD-10-CM

## 2012-02-24 DIAGNOSIS — I4891 Unspecified atrial fibrillation: Secondary | ICD-10-CM

## 2012-02-29 ENCOUNTER — Other Ambulatory Visit: Payer: Self-pay | Admitting: *Deleted

## 2012-02-29 MED ORDER — BENAZEPRIL HCL 10 MG PO TABS: 10.0000 mg | ORAL_TABLET | Freq: Every day | ORAL | Status: DC

## 2012-03-12 ENCOUNTER — Ambulatory Visit (HOSPITAL_COMMUNITY): Payer: BC Managed Care – PPO | Attending: Internal Medicine

## 2012-03-12 DIAGNOSIS — I4891 Unspecified atrial fibrillation: Secondary | ICD-10-CM | POA: Insufficient documentation

## 2012-03-12 DIAGNOSIS — I369 Nonrheumatic tricuspid valve disorder, unspecified: Secondary | ICD-10-CM | POA: Insufficient documentation

## 2012-03-12 DIAGNOSIS — I059 Rheumatic mitral valve disease, unspecified: Secondary | ICD-10-CM | POA: Insufficient documentation

## 2012-03-12 DIAGNOSIS — I428 Other cardiomyopathies: Secondary | ICD-10-CM | POA: Insufficient documentation

## 2012-03-12 DIAGNOSIS — I080 Rheumatic disorders of both mitral and aortic valves: Secondary | ICD-10-CM

## 2012-03-12 NOTE — Progress Notes (Signed)
Echocardiogram performed.  

## 2012-03-16 ENCOUNTER — Telehealth: Payer: Self-pay

## 2012-03-16 ENCOUNTER — Telehealth: Payer: Self-pay | Admitting: Cardiology

## 2012-03-16 NOTE — Telephone Encounter (Signed)
Lm for patient to call office

## 2012-03-16 NOTE — Telephone Encounter (Signed)
Message copied by Yolonda Kida on Fri Mar 16, 2012 12:56 PM ------      Message from: [name]      Created: Tue Mar 13, 2012  5:17 PM       The EF is now normal which is much improved compared to the previous TTE and consistent with his TEE done at the time of ablation.  Exciting news.  Call Edward Pittman with the results and continue current therapy.

## 2012-03-16 NOTE — Telephone Encounter (Signed)
Follow-up:    Patient returned your call.  Please call back. 

## 2012-03-28 ENCOUNTER — Telehealth: Payer: Self-pay

## 2012-03-28 NOTE — Telephone Encounter (Signed)
Message copied by Yolonda Kida on Wed Mar 28, 2012 12:58 PM ------      Message from: [name]      Created: Tue Mar 13, 2012  5:17 PM       The EF is now normal which is much improved compared to the previous TTE and consistent with his TEE done at the time of ablation.  Exciting news.  Call Mr. Adamczak with the results and continue current therapy.

## 2012-03-28 NOTE — Telephone Encounter (Signed)
Patient aware of echo

## 2012-04-02 ENCOUNTER — Other Ambulatory Visit: Payer: Self-pay | Admitting: Cardiology

## 2012-04-09 ENCOUNTER — Ambulatory Visit (INDEPENDENT_AMBULATORY_CARE_PROVIDER_SITE_OTHER): Payer: BC Managed Care – PPO | Admitting: *Deleted

## 2012-04-09 DIAGNOSIS — I4819 Other persistent atrial fibrillation: Secondary | ICD-10-CM

## 2012-04-09 DIAGNOSIS — I4891 Unspecified atrial fibrillation: Secondary | ICD-10-CM

## 2012-04-16 ENCOUNTER — Ambulatory Visit: Payer: BC Managed Care – PPO | Admitting: Internal Medicine

## 2012-05-03 ENCOUNTER — Ambulatory Visit (INDEPENDENT_AMBULATORY_CARE_PROVIDER_SITE_OTHER): Payer: Medicare Other | Admitting: Internal Medicine

## 2012-05-03 ENCOUNTER — Encounter: Payer: Self-pay | Admitting: Internal Medicine

## 2012-05-03 VITALS — BP 118/72 | HR 50 | Ht 73.0 in | Wt 237.0 lb

## 2012-05-03 DIAGNOSIS — I4891 Unspecified atrial fibrillation: Secondary | ICD-10-CM

## 2012-05-03 DIAGNOSIS — I4819 Other persistent atrial fibrillation: Secondary | ICD-10-CM

## 2012-05-03 NOTE — Patient Instructions (Addendum)
Your physician wants you to follow-up in: 3 months with Dr [name] and 6 months with Dr Jacquiline Doe will receive a reminder letter in the mail two months in advance. If you don't receive a letter, please call our office to schedule the follow-up appointment.  Your physician has recommended you make the following change in your medication:  1) Stop Tikosyn

## 2012-05-06 NOTE — Progress Notes (Signed)
PCP: Illene Regulus, MD Primary Cardiologist:  [name] is a 71 y.o. male who presents today for routine electrophysiology followup.  Since his afib ablation, the patient reports doing very well.  He has had no episodes of afib since his last visit to our clinic.  He has only short palpitations lasting < 1 minute. Today, he denies symptoms of chest pain, shortness of breath,  lower extremity edema, dizziness, presyncope, or syncope.  The patient is otherwise without complaint today.   Past Medical History  Diagnosis Date  . Persistent atrial fibrillation     s/p ablation 4/16  Dr. Johney Frame  . Nonischemic cardiomyopathy   . Dyslipidemia   . Keloid   . DJD (degenerative joint disease) of hip   . HTN (hypertension)   . Mild mitral regurgitation by prior echocardiogram   . Hx of adenomatous colonic polyps   . Hx of colonoscopy   . Chronic systolic dysfunction of left ventricle    Past Surgical History  Procedure Date  . Femoral hernia repair     at age 46  . Tee without cardioversion 10/10/2011    Procedure: TRANSESOPHAGEAL ECHOCARDIOGRAM (TEE);  Surgeon: Pricilla Riffle, MD;  Location: [facility] ENDOSCOPY;  Service: Cardiovascular;  Laterality: N/A;  . Hernia repair     Current Outpatient Prescriptions  Medication Sig Dispense Refill  . benazepril (LOTENSIN) 10 MG tablet Take 1 tablet (10 mg total) by mouth daily.  30 tablet  5  . dofetilide (TIKOSYN) 500 MCG capsule Take 500 mcg by mouth 2 (two) times daily.      . fish oil-omega-3 fatty acids 1000 MG capsule Take 1 g by mouth once a week.      . furosemide (LASIX) 40 MG tablet Take 40 mg by mouth 2 (two) times daily as needed. For swelling      . metoprolol (LOPRESSOR) 100 MG tablet Take 1 tablet (100 mg total) by mouth 2 (two) times daily.  180 tablet  3  . potassium chloride (MICRO-K) 10 MEQ CR capsule Take 10 mEq by mouth daily.      . simvastatin (ZOCOR) 40 MG tablet Take 1 tablet (40 mg total) by mouth every evening.   30 tablet  3  . warfarin (COUMADIN) 5 MG tablet TAKE AS DIRECTED BY ANTICOAGULATION CLINIC.  40 tablet  3    Physical Exam: Filed Vitals:   05/03/12 1533  BP: 118/72  Pulse: 50  Height: 6\' 1"  (1.854 m)  Weight: 237 lb (107.502 kg)  SpO2: 97%    GEN- The patient is well appearing, alert and oriented x 3 today.   Head- normocephalic, atraumatic Eyes-  Sclera clear, conjunctiva pink Ears- hearing intact Oropharynx- clear Lungs- Clear to ausculation bilaterally, normal work of breathing Heart- Regular rate and rhythm, no murmurs, rubs or gallops, PMI not laterally displaced GI- soft, NT, ND, + BS Extremities- no clubbing, cyanosis, or edema  ekg today reveals sinus bradycardia 52 bpm, otherwise normal ekg, Qtc 444  Assessment and Plan:

## 2012-05-06 NOTE — Assessment & Plan Note (Signed)
No afib recurrence s/p ablation Stop tikosyn today  Continue coumadin

## 2012-05-14 ENCOUNTER — Other Ambulatory Visit: Payer: Self-pay | Admitting: Cardiology

## 2012-05-14 NOTE — Telephone Encounter (Signed)
..   Requested Prescriptions   Pending Prescriptions Disp Refills  . potassium chloride (MICRO-K) 10 MEQ CR capsule [Pharmacy Med Name: POTASSIUM CL ER 10 MEQ CAPSULE] 30 capsule 6    Sig: take 1 capsule by mouth once daily  . simvastatin (ZOCOR) 40 MG tablet [Pharmacy Med Name: SIMVASTATIN 40 MG TABLET] 30 tablet 6    Sig: take 1 tablet by mouth every evening

## 2012-05-21 ENCOUNTER — Ambulatory Visit (INDEPENDENT_AMBULATORY_CARE_PROVIDER_SITE_OTHER): Payer: Medicare Other

## 2012-05-21 DIAGNOSIS — I4819 Other persistent atrial fibrillation: Secondary | ICD-10-CM

## 2012-05-21 DIAGNOSIS — I4891 Unspecified atrial fibrillation: Secondary | ICD-10-CM

## 2012-05-21 LAB — POCT INR: INR: 2.4

## 2012-07-02 ENCOUNTER — Ambulatory Visit (INDEPENDENT_AMBULATORY_CARE_PROVIDER_SITE_OTHER): Payer: Medicare Other | Admitting: *Deleted

## 2012-07-02 DIAGNOSIS — I4891 Unspecified atrial fibrillation: Secondary | ICD-10-CM

## 2012-07-02 DIAGNOSIS — I4819 Other persistent atrial fibrillation: Secondary | ICD-10-CM

## 2012-07-02 LAB — POCT INR: INR: 3

## 2012-07-11 ENCOUNTER — Other Ambulatory Visit: Payer: Self-pay | Admitting: Cardiology

## 2012-08-02 ENCOUNTER — Other Ambulatory Visit: Payer: Self-pay | Admitting: Cardiology

## 2012-08-03 ENCOUNTER — Encounter: Payer: Self-pay | Admitting: Cardiology

## 2012-08-03 ENCOUNTER — Ambulatory Visit (INDEPENDENT_AMBULATORY_CARE_PROVIDER_SITE_OTHER): Payer: Medicare Other

## 2012-08-03 ENCOUNTER — Ambulatory Visit (INDEPENDENT_AMBULATORY_CARE_PROVIDER_SITE_OTHER): Payer: Medicare Other | Admitting: Cardiology

## 2012-08-03 VITALS — BP 138/74 | HR 86 | Ht 73.0 in | Wt 239.2 lb

## 2012-08-03 DIAGNOSIS — I4891 Unspecified atrial fibrillation: Secondary | ICD-10-CM

## 2012-08-03 DIAGNOSIS — I080 Rheumatic disorders of both mitral and aortic valves: Secondary | ICD-10-CM

## 2012-08-03 DIAGNOSIS — I10 Essential (primary) hypertension: Secondary | ICD-10-CM

## 2012-08-03 DIAGNOSIS — I428 Other cardiomyopathies: Secondary | ICD-10-CM

## 2012-08-03 DIAGNOSIS — I4819 Other persistent atrial fibrillation: Secondary | ICD-10-CM

## 2012-08-03 LAB — POCT INR: INR: 2.8

## 2012-08-03 MED ORDER — RIVAROXABAN 20 MG PO TABS: 20.0000 mg | ORAL_TABLET | Freq: Every day | ORAL | Status: DC

## 2012-08-03 NOTE — Patient Instructions (Addendum)
Start Xarelto 20 mg once a day Discontinue your coumadin/warfarin. Continue all other medications as listed.  Please see Dr Johney Frame to discuss repeat atrial fib ablation  See Dr [name] 6 months after seeing Dr Johney Frame.

## 2012-08-03 NOTE — Progress Notes (Signed)
HPI The patient presents for follow up of atrial fibrillation.  He is status post ablation on 4/16.  He had only paroxysmal atrial fibrillation for a while but now he knows that he's been in persistent fibrillation for some time. He feels an irregular beat. Of note his ejection fraction improved dramatically with sinus rhythm. We have seen this twice. He's not having any acute shortness of breath, PND or orthopnea. He's not having any palpitations, presyncope or syncope. He's not having any chest pressure, neck or arm discomfort. He's doing some light exercises.   No Known Allergies  Current Outpatient Prescriptions  Medication Sig Dispense Refill  . benazepril (LOTENSIN) 10 MG tablet Take 1 tablet (10 mg total) by mouth daily.  30 tablet  5  . fish oil-omega-3 fatty acids 1000 MG capsule Take 1 g by mouth once a week.      . furosemide (LASIX) 40 MG tablet Take 40 mg by mouth 2 (two) times daily as needed. For swelling      . metoprolol (LOPRESSOR) 100 MG tablet take 1 tablet by mouth twice a day  60 tablet  3  . potassium chloride (MICRO-K) 10 MEQ CR capsule take 1 capsule by mouth once daily  30 capsule  6  . simvastatin (ZOCOR) 40 MG tablet take 1 tablet by mouth every evening  30 tablet  6  . warfarin (COUMADIN) 5 MG tablet TAKE AS DIRECTED BY ANTICOAGULATION CLINIC.  35 tablet  3    Past Medical History  Diagnosis Date  . Persistent atrial fibrillation     s/p ablation 4/16  Dr. Johney Frame  . Nonischemic cardiomyopathy   . Dyslipidemia   . Keloid   . DJD (degenerative joint disease) of hip   . HTN (hypertension)   . Mild mitral regurgitation by prior echocardiogram   . Hx of adenomatous colonic polyps   . Hx of colonoscopy   . Chronic systolic dysfunction of left ventricle     Past Surgical History  Procedure Date  . Femoral hernia repair     at age 27  . Tee without cardioversion 10/10/2011    Procedure: TRANSESOPHAGEAL ECHOCARDIOGRAM (TEE);  Surgeon: Pricilla Riffle, MD;   Location: [facility] ENDOSCOPY;  Service: Cardiovascular;  Laterality: N/A;  . Hernia repair     ROS:  As stated in the HPI and negative for all other systems.  PHYSICAL EXAM BP 138/74  Pulse 86  Ht 6\' 1"  (1.854 m)  Wt 239 lb 3.2 oz (108.5 kg)  BMI 31.56 kg/m2 GENERAL:  Well appearing NECK:  No jugular venous distention, waveform within normal limits, carotid upstroke brisk and symmetric, no bruits, no thyromegaly LUNGS:  Clear to auscultation bilaterally CHEST:  Unremarkable HEART:  PMI not displaced or sustained,S1 and S2 within normal limits, no S3, no clicks, no rubs, no murmurs, irregular ABD:  Flat, positive bowel sounds normal in frequency in pitch, no bruits, no rebound, no guarding, no midline pulsatile mass, no hepatomegaly, no splenomegaly EXT:  2 plus pulses throughout, no edema, no cyanosis no clubbing SKIN:  No bruising, keloids  EKG: Atrial fibrillation, rate 86, axis normal limits, intervals within normal limits, no acute ST-T wave changes. 08/03/2012   ASSESSMENT AND PLAN  Persistent atrial fibrillation -  He would like to avoid seeing back on Tikosyn which was stopped by Dr. Johney Frame at the last appointment. He would consider repeat ablation and so I will set up a followup BP appointment to discuss this. He would like  to switch to a different agent from warfarin so I will start Xarelto.   Cardiomyopathy, nonischemic -  His EF in the fall was normal.  He will continue the meds as listed.

## 2012-09-04 ENCOUNTER — Ambulatory Visit (INDEPENDENT_AMBULATORY_CARE_PROVIDER_SITE_OTHER): Payer: Medicare Other | Admitting: *Deleted

## 2012-09-04 DIAGNOSIS — I4891 Unspecified atrial fibrillation: Secondary | ICD-10-CM

## 2012-09-04 LAB — BASIC METABOLIC PANEL
GFR: 97.35 mL/min (ref >60.00)
Glucose, Bld: 148 mg/dL — ABNORMAL HIGH (ref 70–99)
Potassium: 4.1 mEq/L (ref 3.5–5.1)
Sodium: 138 mEq/L (ref 135–145)

## 2012-09-04 LAB — CBC
MCHC: 33.4 g/dL (ref 30.0–36.0)
MCV: 96.6 fl (ref 78.0–100.0)
RBC: 4.73 Mil/uL (ref 4.22–5.81)

## 2012-09-04 NOTE — Progress Notes (Signed)
Pt was started on Xarelto for Afib on 08/03/12.    Reviewed patients medication list.  Pt is not  currently on any combined P-gp and strong CYP3A4 inhibitors/inducers (ketoconazole, traconazole, ritonavir, carbamazepine, phenytoin, rifampin, St. Daelin's wort).  Reviewed labs.  SCr 1.0 Weight 109 Kg  CrCl- 107.  Dose appropriate based on CrCl.   Hgb and HCT 15.3/45.7 WNL.   A full discussion of the nature of anticoagulants has been carried out.  A benefit/risk analysis has been presented to the patient, so that they understand the justification for choosing anticoagulation with Xarelto at this time.  The need for compliance is stressed.  Pt is aware to take the medication once daily with the largest meal of the day.  Side effects of potential bleeding are discussed, including unusual colored urine or stools, coughing up blood or coffee ground emesis, nose bleeds or serious fall or head trauma.  Discussed signs and symptoms of stroke. The patient should avoid any OTC items containing aspirin or ibuprofen.  Avoid alcohol consumption.   Call if any signs of abnormal bleeding.  Discussed financial obligations and resolved any difficulty in obtaining medication.  Next lab test test in 6 months.

## 2012-10-18 ENCOUNTER — Other Ambulatory Visit: Payer: Self-pay | Admitting: Emergency Medicine

## 2012-10-18 MED ORDER — BENAZEPRIL HCL 10 MG PO TABS: 10.0000 mg | ORAL_TABLET | Freq: Every day | ORAL | Status: DC

## 2013-02-16 ENCOUNTER — Other Ambulatory Visit: Payer: Self-pay | Admitting: Cardiology

## 2013-02-27 ENCOUNTER — Ambulatory Visit (INDEPENDENT_AMBULATORY_CARE_PROVIDER_SITE_OTHER): Payer: Medicare Other | Admitting: *Deleted

## 2013-02-27 ENCOUNTER — Ambulatory Visit (INDEPENDENT_AMBULATORY_CARE_PROVIDER_SITE_OTHER): Payer: Medicare Other | Admitting: Cardiology

## 2013-02-27 ENCOUNTER — Encounter: Payer: Self-pay | Admitting: Cardiology

## 2013-02-27 VITALS — BP 108/84 | HR 86 | Ht 74.0 in | Wt 235.0 lb

## 2013-02-27 DIAGNOSIS — I080 Rheumatic disorders of both mitral and aortic valves: Secondary | ICD-10-CM

## 2013-02-27 DIAGNOSIS — I428 Other cardiomyopathies: Secondary | ICD-10-CM

## 2013-02-27 DIAGNOSIS — I4891 Unspecified atrial fibrillation: Secondary | ICD-10-CM

## 2013-02-27 LAB — CBC WITH DIFFERENTIAL/PLATELET
Basophils Relative: 0.4 % (ref 0.0–3.0)
Eosinophils Absolute: 0.1 10*3/uL (ref 0.0–0.7)
Hemoglobin: 15.4 g/dL (ref 13.0–17.0)
MCHC: 33.9 g/dL (ref 30.0–36.0)
MCV: 96.6 fl (ref 78.0–100.0)
Monocytes Absolute: 0.7 10*3/uL (ref 0.1–1.0)
Neutro Abs: 5.3 10*3/uL (ref 1.4–7.7)
RBC: 4.7 Mil/uL (ref 4.22–5.81)

## 2013-02-27 LAB — BASIC METABOLIC PANEL
CO2: 28 mEq/L (ref 19–32)
Chloride: 102 mEq/L (ref 96–112)
Sodium: 135 mEq/L (ref 135–145)

## 2013-02-27 MED ORDER — METOPROLOL TARTRATE 100 MG PO TABS: ORAL_TABLET | ORAL | Status: DC

## 2013-02-27 MED ORDER — BENAZEPRIL HCL 10 MG PO TABS: 10.0000 mg | ORAL_TABLET | Freq: Every day | ORAL | Status: DC

## 2013-02-27 NOTE — Patient Instructions (Addendum)
Your physician wants you to follow-up in: 1 year  You will receive a reminder letter in the mail two months in advance. If you don't receive a letter, please call our office to schedule the follow-up appointment.  Your physician recommends that you continue on your current medications as directed. Please refer to the Current Medication list given to you today.  

## 2013-02-27 NOTE — Progress Notes (Signed)
Pt was started on Xarelto for Afib on 2/714.    Reviewed patients medication list.  Pt is not  currently on any combined P-gp and strong CYP3A4 inhibitors/inducers (ketoconazole, traconazole, ritonavir, carbamazepine, phenytoin, rifampin, St. Vint's wort).  Reviewed labs.  SCr 1.0, Weight 106Kg, CrCl- 103.  Dose appropriate based on CrCl.   Hgb and HCT 15.4 and 45.5 .  A full discussion of the nature of anticoagulants has been carried out.  A benefit/risk analysis has been presented to the patient, so that they understand the justification for choosing anticoagulation with Xarelto at this time.  The need for compliance is stressed.  Pt is aware to take the medication once daily with the largest meal of the day.  Side effects of potential bleeding are discussed, including unusual colored urine or stools, coughing up blood or coffee ground emesis, nose bleeds or serious fall or head trauma.  Discussed signs and symptoms of stroke. The patient should avoid any OTC items containing aspirin or ibuprofen.  Avoid alcohol consumption.   Call if any signs of abnormal bleeding.  Discussed financial obligations and resolved any difficulty in obtaining medication.  Next lab test test in 6 months.

## 2013-02-27 NOTE — Progress Notes (Signed)
HPI The patient presents for follow up of atrial fibrillation.  He is status post ablation but has reverted back to atrial fib.  He was in this rhythm the last time I saw him and is still in this.  He does feel palpitations mostly at night.  However, he has no severe symptoms.  The patient denies any new symptoms such as chest discomfort, neck or arm discomfort. There has been no new shortness of breath, PND or orthopnea. There has been no reported presyncope or syncope.    No Known Allergies  Current Outpatient Prescriptions  Medication Sig Dispense Refill  . benazepril (LOTENSIN) 10 MG tablet Take 1 tablet (10 mg total) by mouth daily.  30 tablet  5  . fish oil-omega-3 fatty acids 1000 MG capsule Take 1 g by mouth once a week.      . furosemide (LASIX) 40 MG tablet Take 40 mg by mouth 2 (two) times daily as needed. For swelling      . metoprolol (LOPRESSOR) 100 MG tablet take 1 tablet by mouth twice a day  60 tablet  3  . potassium chloride (MICRO-K) 10 MEQ CR capsule take 1 capsule by mouth once daily  30 capsule  3  . Rivaroxaban (XARELTO) 20 MG TABS Take 1 tablet (20 mg total) by mouth daily.  30 tablet  11  . simvastatin (ZOCOR) 40 MG tablet take 1 tablet by mouth every evening  30 tablet  3   No current facility-administered medications for this visit.    Past Medical History  Diagnosis Date  . Persistent atrial fibrillation     s/p ablation 4/16  Dr. Johney Frame  . Nonischemic cardiomyopathy   . Dyslipidemia   . Keloid   . DJD (degenerative joint disease) of hip   . HTN (hypertension)   . Mild mitral regurgitation by prior echocardiogram   . Hx of adenomatous colonic polyps   . Hx of colonoscopy   . Chronic systolic dysfunction of left ventricle     Past Surgical History  Procedure Laterality Date  . Femoral hernia repair      at age 72  . Tee without cardioversion  10/10/2011    Procedure: TRANSESOPHAGEAL ECHOCARDIOGRAM (TEE);  Surgeon: Pricilla Riffle, MD;  Location: [facility]  ENDOSCOPY;  Service: Cardiovascular;  Laterality: N/A;  . Hernia repair      ROS:  As stated in the HPI and negative for all other systems.  PHYSICAL EXAM BP 108/84  Pulse 86  Ht 6\' 2"  (1.88 m)  Wt 235 lb (106.595 kg)  BMI 30.16 kg/m2 GENERAL:  Well appearing NECK:  No jugular venous distention, waveform within normal limits, carotid upstroke brisk and symmetric, no bruits, no thyromegaly LUNGS:  Clear to auscultation bilaterally CHEST:  Unremarkable HEART:  PMI not displaced or sustained,S1 and S2 within normal limits, no S3, no clicks, no rubs, no murmurs, irregular ABD:  Flat, positive bowel sounds normal in frequency in pitch, no bruits, no rebound, no guarding, no midline pulsatile mass, no hepatomegaly, no splenomegaly EXT:  2 plus pulses throughout, no edema, no cyanosis no clubbing SKIN:  No bruising, keloids  EKG: Atrial fibrillation, rate 86, axis normal limits, intervals within normal limits, no acute ST-T wave changes. 02/27/2013   ASSESSMENT AND PLAN  Persistent atrial fibrillation -  Now over multiple visits we have agreed that he is not particularly symptomatic in fibrillation. He does not want to restart antiarrhythmics or have ablation. He tolerates rate control and anticoagulation.  He will continue with this.  Cardiomyopathy, nonischemic -  His last EF was normal.  He will continue the meds as listed.   HTN - The blood pressure is at target. No change in medications is indicated. We will continue with therapeutic lifestyle changes (TLC).

## 2013-07-18 ENCOUNTER — Other Ambulatory Visit: Payer: Self-pay | Admitting: Cardiology

## 2013-08-06 ENCOUNTER — Other Ambulatory Visit: Payer: Self-pay | Admitting: Cardiology

## 2013-08-29 ENCOUNTER — Other Ambulatory Visit: Payer: Self-pay | Admitting: *Deleted

## 2013-08-29 MED ORDER — METOPROLOL TARTRATE 100 MG PO TABS: ORAL_TABLET | ORAL | Status: DC

## 2013-08-29 NOTE — Telephone Encounter (Signed)
Called for refill

## 2013-09-04 ENCOUNTER — Ambulatory Visit (INDEPENDENT_AMBULATORY_CARE_PROVIDER_SITE_OTHER): Payer: Commercial Managed Care - HMO | Admitting: *Deleted

## 2013-09-04 DIAGNOSIS — I4891 Unspecified atrial fibrillation: Secondary | ICD-10-CM

## 2013-09-04 LAB — BASIC METABOLIC PANEL
BUN: 10 mg/dL (ref 6–23)
CO2: 26 mEq/L (ref 19–32)
Calcium: 9.1 mg/dL (ref 8.4–10.5)
Chloride: 106 mEq/L (ref 96–112)
Creatinine, Ser: 0.9 mg/dL (ref 0.4–1.5)
GFR: 111.37 mL/min (ref >60.00)
Glucose, Bld: 114 mg/dL — ABNORMAL HIGH (ref 70–99)
Potassium: 4 mEq/L (ref 3.5–5.1)
Sodium: 138 mEq/L (ref 135–145)

## 2013-09-04 LAB — CBC
HCT: 45.4 % (ref 39.0–52.0)
Hemoglobin: 15.2 g/dL (ref 13.0–17.0)
MCHC: 33.6 g/dL (ref 30.0–36.0)
MCV: 98.3 fl (ref 78.0–100.0)
Platelets: 178 10*3/uL (ref 150.0–400.0)
RBC: 4.62 Mil/uL (ref 4.22–5.81)
RDW: 13.4 % (ref 11.5–14.6)
WBC: 6.6 10*3/uL (ref 4.5–10.5)

## 2013-09-04 NOTE — Patient Instructions (Signed)

## 2013-09-04 NOTE — Progress Notes (Signed)
Pt was started on Xarelto for A Fib on 08/03/12.    Reviewed patients medication list.  Pt is not currently on any combined P-gp and strong CYP3A4 inhibitors/inducers (ketoconazole, traconazole, ritonavir, carbamazepine, phenytoin, rifampin, St. Yan's wort).  Reviewed labs.  SCr 0.9, Weight 108.6Kg, CrCl- 117.  Dose appropriate based on CrCl.   Hgb and HCT 15.2/45.4.    A full discussion of the nature of anticoagulants has been carried out.  A benefit/risk analysis has been presented to the patient, so that they understand the justification for choosing anticoagulation with Xarelto at this time.  The need for compliance is stressed.  Pt is aware to take the medication once daily with the largest meal of the day.  Side effects of potential bleeding are discussed, including unusual colored urine or stools, coughing up blood or coffee ground emesis, nose bleeds or serious fall or head trauma.  Discussed signs and symptoms of stroke. The patient should avoid any OTC items containing aspirin or ibuprofen.  Avoid alcohol consumption.   Call if any signs of abnormal bleeding.  Discussed financial obligations and resolved any difficulty in obtaining medication.  Next lab test test in 6 months.

## 2013-11-20 ENCOUNTER — Telehealth: Payer: Self-pay | Admitting: Internal Medicine

## 2013-11-20 NOTE — Telephone Encounter (Signed)
Ok with me 

## 2013-11-20 NOTE — Telephone Encounter (Signed)
Left detail massage for pt to call back and make an appt.  °

## 2013-11-20 NOTE — Telephone Encounter (Signed)
Pt called request to be Dr. Ronnald Ramp pt (transfer from [place]) pt stated he would like the same doctor with his spouse, Enid Derry Avitabile, Please advise. Ok to leave detail massage on [phone].

## 2013-12-12 ENCOUNTER — Other Ambulatory Visit (INDEPENDENT_AMBULATORY_CARE_PROVIDER_SITE_OTHER): Payer: Commercial Managed Care - HMO

## 2013-12-12 ENCOUNTER — Ambulatory Visit (INDEPENDENT_AMBULATORY_CARE_PROVIDER_SITE_OTHER): Payer: Commercial Managed Care - HMO | Admitting: Internal Medicine

## 2013-12-12 ENCOUNTER — Encounter: Payer: Self-pay | Admitting: Internal Medicine

## 2013-12-12 VITALS — BP 114/78 | HR 92 | Temp 98.1°F | Resp 16 | Ht 73.75 in | Wt 242.8 lb

## 2013-12-12 DIAGNOSIS — I4891 Unspecified atrial fibrillation: Secondary | ICD-10-CM

## 2013-12-12 DIAGNOSIS — I4819 Other persistent atrial fibrillation: Secondary | ICD-10-CM

## 2013-12-12 DIAGNOSIS — E785 Hyperlipidemia, unspecified: Secondary | ICD-10-CM

## 2013-12-12 DIAGNOSIS — N400 Enlarged prostate without lower urinary tract symptoms: Secondary | ICD-10-CM

## 2013-12-12 DIAGNOSIS — I428 Other cardiomyopathies: Secondary | ICD-10-CM

## 2013-12-12 DIAGNOSIS — R7401 Elevation of levels of liver transaminase levels: Secondary | ICD-10-CM | POA: Insufficient documentation

## 2013-12-12 DIAGNOSIS — I080 Rheumatic disorders of both mitral and aortic valves: Secondary | ICD-10-CM

## 2013-12-12 DIAGNOSIS — Z23 Encounter for immunization: Secondary | ICD-10-CM

## 2013-12-12 DIAGNOSIS — R7309 Other abnormal glucose: Secondary | ICD-10-CM

## 2013-12-12 DIAGNOSIS — R7402 Elevation of levels of lactic acid dehydrogenase (LDH): Secondary | ICD-10-CM | POA: Insufficient documentation

## 2013-12-12 DIAGNOSIS — M545 Low back pain, unspecified: Secondary | ICD-10-CM

## 2013-12-12 DIAGNOSIS — Z0000 Encounter for general adult medical examination without abnormal findings: Secondary | ICD-10-CM | POA: Insufficient documentation

## 2013-12-12 DIAGNOSIS — I10 Essential (primary) hypertension: Secondary | ICD-10-CM

## 2013-12-12 DIAGNOSIS — R740 Nonspecific elevation of levels of transaminase and lactic acid dehydrogenase [LDH]: Secondary | ICD-10-CM

## 2013-12-12 DIAGNOSIS — Z86010 Personal history of colonic polyps: Secondary | ICD-10-CM

## 2013-12-12 LAB — CBC WITH DIFFERENTIAL/PLATELET
BASOS ABS: 0 10*3/uL (ref 0.0–0.1)
BASOS PCT: 0.5 % (ref 0.0–3.0)
Eosinophils Absolute: 0.1 10*3/uL (ref 0.0–0.7)
Eosinophils Relative: 1.9 % (ref 0.0–5.0)
HCT: 44.9 % (ref 39.0–52.0)
HEMOGLOBIN: 15.4 g/dL (ref 13.0–17.0)
Lymphocytes Relative: 35.8 % (ref 12.0–46.0)
Lymphs Abs: 2.6 10*3/uL (ref 0.7–4.0)
MCHC: 34.3 g/dL (ref 30.0–36.0)
MCV: 96.5 fl (ref 78.0–100.0)
MONOS PCT: 9.7 % (ref 3.0–12.0)
Monocytes Absolute: 0.7 10*3/uL (ref 0.1–1.0)
NEUTROS ABS: 3.8 10*3/uL (ref 1.4–7.7)
NEUTROS PCT: 52.1 % (ref 43.0–77.0)
Platelets: 176 10*3/uL (ref 150.0–400.0)
RBC: 4.65 Mil/uL (ref 4.22–5.81)
RDW: 13.3 % (ref 11.5–15.5)
WBC: 7.2 10*3/uL (ref 4.0–10.5)

## 2013-12-12 LAB — COMPREHENSIVE METABOLIC PANEL
ALT: 42 U/L (ref 0–53)
AST: 41 U/L — ABNORMAL HIGH (ref 0–37)
Albumin: 4.4 g/dL (ref 3.5–5.2)
Alkaline Phosphatase: 232 U/L — ABNORMAL HIGH (ref 39–117)
BUN: 14 mg/dL (ref 6–23)
CALCIUM: 9.4 mg/dL (ref 8.4–10.5)
CHLORIDE: 106 meq/L (ref 96–112)
CO2: 27 meq/L (ref 19–32)
Creatinine, Ser: 1 mg/dL (ref 0.4–1.5)
GFR: 90.57 mL/min (ref >60.00)
Glucose, Bld: 128 mg/dL — ABNORMAL HIGH (ref 70–99)
Potassium: 4.1 mEq/L (ref 3.5–5.1)
Sodium: 139 mEq/L (ref 135–145)
Total Bilirubin: 1.3 mg/dL — ABNORMAL HIGH (ref 0.2–1.2)
Total Protein: 7.3 g/dL (ref 6.0–8.3)

## 2013-12-12 LAB — LIPID PANEL
CHOLESTEROL: 162 mg/dL (ref 0–200)
HDL: 34.2 mg/dL — AB (ref >39.00)
LDL Cholesterol: 110 mg/dL — ABNORMAL HIGH (ref 0–99)
NonHDL: 127.8
TRIGLYCERIDES: 88 mg/dL (ref 0.0–149.0)
Total CHOL/HDL Ratio: 5
VLDL: 17.6 mg/dL (ref 0.0–40.0)

## 2013-12-12 LAB — TSH: TSH: 2.16 u[IU]/mL (ref 0.35–4.50)

## 2013-12-12 LAB — HEMOGLOBIN A1C: Hgb A1c MFr Bld: 6.4 % (ref 4.6–6.5)

## 2013-12-12 LAB — PSA: PSA: 0.81 ng/mL (ref 0.10–4.00)

## 2013-12-12 LAB — FECAL OCCULT BLOOD, GUAIAC: Fecal Occult Blood: NEGATIVE

## 2013-12-12 LAB — HEPATITIS C ANTIBODY: HCV AB: NEGATIVE

## 2013-12-12 NOTE — Progress Notes (Signed)
Pre visit review using our clinic review tool, if applicable. No additional management support is needed unless otherwise documented below in the visit note. 

## 2013-12-12 NOTE — Patient Instructions (Signed)
Health Maintenance A healthy lifestyle and preventative care can promote health and wellness.  Maintain regular health, dental, and eye exams.  Eat a healthy diet. Foods like vegetables, fruits, whole grains, low-fat dairy products, and lean protein foods contain the nutrients you need and are low in calories. Decrease your intake of foods high in solid fats, added sugars, and salt. Get information about a proper diet from your health care provider, if necessary.  Regular physical exercise is one of the most important things you can do for your health. Most adults should get at least 150 minutes of moderate-intensity exercise (any activity that increases your heart rate and causes you to sweat) each week. In addition, most adults need muscle-strengthening exercises on 2 or more days a week.   Maintain a healthy weight. The body mass index (BMI) is a screening tool to identify possible weight problems. It provides an estimate of body fat based on height and weight. Your health care provider can find your BMI and can help you achieve or maintain a healthy weight. For males 20 years and older:  A BMI below 18.5 is considered underweight.  A BMI of 18.5 to 24.9 is normal.  A BMI of 25 to 29.9 is considered overweight.  A BMI of 30 and above is considered obese.  Maintain normal blood lipids and cholesterol by exercising and minimizing your intake of saturated fat. Eat a balanced diet with plenty of fruits and vegetables. Blood tests for lipids and cholesterol should begin at age 20 and be repeated every 5 years. If your lipid or cholesterol levels are high, you are over age 50, or you are at high risk for heart disease, you may need your cholesterol levels checked more frequently.Ongoing high lipid and cholesterol levels should be treated with medicines if diet and exercise are not working.  If you smoke, find out from your health care provider how to quit. If you do not use tobacco, do not  start.  Lung cancer screening is recommended for adults aged 55-80 years who are at high risk for developing lung cancer because of a history of smoking. A yearly low-dose CT scan of the lungs is recommended for people who have at least a 30-pack-year history of smoking and are current smokers or have quit within the past 15 years. A pack year of smoking is smoking an average of 1 pack of cigarettes a day for 1 year (for example, a 30-pack-year history of smoking could mean smoking 1 pack a day for 30 years or 2 packs a day for 15 years). Yearly screening should continue until the smoker has stopped smoking for at least 15 years. Yearly screening should be stopped for people who develop a health problem that would prevent them from having lung cancer treatment.  If you choose to drink alcohol, do not have more than 2 drinks per day. One drink is considered to be 12 oz (360 mL) of beer, 5 oz (150 mL) of wine, or 1.5 oz (45 mL) of liquor.  Avoid the use of street drugs. Do not share needles with anyone. Ask for help if you need support or instructions about stopping the use of drugs.  High blood pressure causes heart disease and increases the risk of stroke. Blood pressure should be checked at least every 1-2 years. Ongoing high blood pressure should be treated with medicines if weight loss and exercise are not effective.  If you are 45-79 years old, ask your health care provider if   you should take aspirin to prevent heart disease.  Diabetes screening involves taking a blood sample to check your fasting blood sugar level. This should be done once every 3 years after age 45 if you are at a normal weight and without risk factors for diabetes. Testing should be considered at a younger age or be carried out more frequently if you are overweight and have at least 1 risk factor for diabetes.  Colorectal cancer can be detected and often prevented. Most routine colorectal cancer screening begins at the age of 50  and continues through age 75. However, your health care provider may recommend screening at an earlier age if you have risk factors for colon cancer. On a yearly basis, your health care provider may provide home test kits to check for hidden blood in the stool. A small camera at the end of a tube may be used to directly examine the colon (sigmoidoscopy or colonoscopy) to detect the earliest forms of colorectal cancer. Talk to your health care provider about this at age 50 when routine screening begins. A direct exam of the colon should be repeated every 5-10 years through age 75, unless early forms of precancerous polyps or small growths are found.  People who are at an increased risk for hepatitis B should be screened for this virus. You are considered at high risk for hepatitis B if:  You were born in a country where hepatitis B occurs often. Talk with your health care provider about which countries are considered high risk.  Your parents were born in a high-risk country and you have not received a shot to protect against hepatitis B (hepatitis B vaccine).  You have HIV or AIDS.  You use needles to inject street drugs.  You live with, or have sex with, someone who has hepatitis B.  You are a man who has sex with other men (MSM).  You get hemodialysis treatment.  You take certain medicines for conditions like cancer, organ transplantation, and autoimmune conditions.  Hepatitis C blood testing is recommended for all people born from 1945 through 1965 and any individual with known risk factors for hepatitis C.  Healthy men should no longer receive prostate-specific antigen (PSA) blood tests as part of routine cancer screening. Talk to your health care provider about prostate cancer screening.  Testicular cancer screening is not recommended for adolescents or adult males who have no symptoms. Screening includes self-exam, a health care provider exam, and other screening tests. Consult with your  health care provider about any symptoms you have or any concerns you have about testicular cancer.  Practice safe sex. Use condoms and avoid high-risk sexual practices to reduce the spread of sexually transmitted infections (STIs).  You should be screened for STIs, including gonorrhea and chlamydia if:  You are sexually active and are younger than 24 years.  You are older than 24 years, and your health care provider tells you that you are at risk for this type of infection.  Your sexual activity has changed since you were last screened, and you are at an increased risk for chlamydia or gonorrhea. Ask your health care provider if you are at risk.  If you are at risk of being infected with HIV, it is recommended that you take a prescription medicine daily to prevent HIV infection. This is called pre-exposure prophylaxis (PrEP). You are considered at risk if:  You are a man who has sex with other men (MSM).  You are a heterosexual man who   is sexually active with multiple partners.  You take drugs by injection.  You are sexually active with a partner who has HIV.  Talk with your health care provider about whether you are at high risk of being infected with HIV. If you choose to begin PrEP, you should first be tested for HIV. You should then be tested every 3 months for as long as you are taking PrEP.  Use sunscreen. Apply sunscreen liberally and repeatedly throughout the day. You should seek shade when your shadow is shorter than you. Protect yourself by wearing long sleeves, pants, a wide-brimmed hat, and sunglasses year round whenever you are outdoors.  Tell your health care provider of new moles or changes in moles, especially if there is a change in shape or color. Also, tell your health care provider if a mole is larger than the size of a pencil eraser.  A one-time screening for abdominal aortic aneurysm (AAA) and surgical repair of large AAAs by ultrasound is recommended for men aged  65-75 years who are current or former smokers.  Stay current with your vaccines (immunizations). Document Released: 12/10/2007 Document Revised: 06/18/2013 Document Reviewed: 11/08/2010 Exit[employer] Patient Information 2015 [employer]. This information is not intended to replace advice given to you by your health care provider. Make sure you discuss any questions you have with your health care provider. Hypertension Hypertension, commonly called high blood pressure, is when the force of blood pumping through your arteries is too strong. Your arteries are the blood vessels that carry blood from your heart throughout your body. A blood pressure reading consists of a higher number over a lower number, such as 110/72. The higher number (systolic) is the pressure inside your arteries when your heart pumps. The lower number (diastolic) is the pressure inside your arteries when your heart relaxes. Ideally you want your blood pressure below 120/80. Hypertension forces your heart to work harder to pump blood. Your arteries may become narrow or stiff. Having hypertension puts you at risk for heart disease, stroke, and other problems.  RISK FACTORS Some risk factors for high blood pressure are controllable. Others are not.  Risk factors you cannot control include:   Race. You may be at higher risk if you are African American.  Age. Risk increases with age.  Gender. Men are at higher risk than women before age 45 years. After age 65, women are at higher risk than men. Risk factors you can control include:  Not getting enough exercise or physical activity.  Being overweight.  Getting too much fat, sugar, calories, or salt in your diet.  Drinking too much alcohol. SIGNS AND SYMPTOMS Hypertension does not usually cause signs or symptoms. Extremely high blood pressure (hypertensive crisis) may cause headache, anxiety, shortness of breath, and nosebleed. DIAGNOSIS  To check if you have hypertension,  your health care provider will measure your blood pressure while you are seated, with your arm held at the level of your heart. It should be measured at least twice using the same arm. Certain conditions can cause a difference in blood pressure between your right and left arms. A blood pressure reading that is higher than normal on one occasion does not mean that you need treatment. If one blood pressure reading is high, ask your health care provider about having it checked again. TREATMENT  Treating high blood pressure includes making lifestyle changes and possibly taking medication. Living a healthy lifestyle can help lower high blood pressure. You may need to change some   of your habits. Lifestyle changes may include:  Following the DASH diet. This diet is high in fruits, vegetables, and whole grains. It is low in salt, red meat, and added sugars.  Getting at least 2 1/2 hours of brisk physical activity every week.  Losing weight if necessary.  Not smoking.  Limiting alcoholic beverages.  Learning ways to reduce stress. If lifestyle changes are not enough to get your blood pressure under control, your health care provider may prescribe medicine. You may need to take more than one. Work closely with your health care provider to understand the risks and benefits. HOME CARE INSTRUCTIONS  Have your blood pressure rechecked as directed by your health care provider.   Only take medicine as directed by your health care provider. Follow the directions carefully. Blood pressure medicines must be taken as prescribed. The medicine does not work as well when you skip doses. Skipping doses also puts you at risk for problems.   Do not smoke.   Monitor your blood pressure at home as directed by your health care provider. SEEK MEDICAL CARE IF:   You think you are having a reaction to medicines taken.  You have recurrent headaches or feel dizzy.  You have swelling in your ankles.  You have  trouble with your vision. SEEK IMMEDIATE MEDICAL CARE IF:  You develop a severe headache or confusion.  You have unusual weakness, numbness, or feel faint.  You have severe chest or abdominal pain.  You vomit repeatedly.  You have trouble breathing. MAKE SURE YOU:   Understand these instructions.  Will watch your condition.  Will get help right away if you are not doing well or get worse. Document Released: 06/13/2005 Document Revised: 06/18/2013 Document Reviewed: 04/05/2013 ExitCare Patient Information 2015 [employer]. This information is not intended to replace advice given to you by your health care provider. Make sure you discuss any questions you have with your health care provider.  

## 2013-12-12 NOTE — Progress Notes (Signed)
Subjective:    Patient ID: Edward Pittman, male    DOB: 07-26-1940, 73 y.o.   MRN: [id number]  Hypertension This is a chronic problem. The current episode started more than 1 year ago. The problem has been gradually improving since onset. The problem is controlled. Pertinent negatives include no anxiety, blurred vision, chest pain, headaches, malaise/fatigue, neck pain, orthopnea, palpitations, peripheral edema, PND, shortness of breath or sweats. Past treatments include ACE inhibitors, beta blockers and diuretics. The current treatment provides moderate improvement. There are no compliance problems.  Hypertensive end-organ damage includes heart failure.      Review of Systems  Constitutional: Negative.  Negative for fever, chills, malaise/fatigue, diaphoresis, appetite change and fatigue.  HENT: Negative.   Eyes: Negative.  Negative for blurred vision.  Respiratory: Negative.  Negative for cough, choking, chest tightness, shortness of breath, wheezing and stridor.   Cardiovascular: Negative.  Negative for chest pain, palpitations, orthopnea, leg swelling and PND.  Gastrointestinal: Negative.  Negative for nausea, vomiting, abdominal pain, diarrhea, constipation and blood in stool.  Endocrine: Negative.   Genitourinary: Negative.  Negative for urgency, frequency, hematuria, flank pain, decreased urine volume, difficulty urinating and testicular pain.  Musculoskeletal: Positive for back pain. Negative for arthralgias, gait problem, joint swelling, myalgias, neck pain and neck stiffness.  Skin: Negative.  Negative for rash.  Allergic/Immunologic: Negative.   Neurological: Negative.  Negative for headaches.  Hematological: Negative.  Negative for adenopathy. Does not bruise/bleed easily.  Psychiatric/Behavioral: Negative.        Objective:   Physical Exam  Vitals reviewed. Constitutional: He is oriented to person, place, and time. He appears well-developed and well-nourished. No  distress.  HENT:  Head: Normocephalic and atraumatic.  Mouth/Throat: Oropharynx is clear and moist. No oropharyngeal exudate.  Eyes: Conjunctivae are normal. Right eye exhibits no discharge. Left eye exhibits no discharge. No scleral icterus.  Neck: Normal range of motion. Neck supple. No JVD present. No tracheal deviation present. No thyromegaly present.  Cardiovascular: Normal rate, regular rhythm and intact distal pulses.  Exam reveals no gallop and no friction rub.   Murmur heard. Pulmonary/Chest: Effort normal and breath sounds normal. No stridor. No respiratory distress. He has no wheezes. He has no rales. He exhibits no tenderness.  Abdominal: Soft. Bowel sounds are normal. He exhibits no distension and no mass. There is no tenderness. There is no rebound and no guarding. Hernia confirmed negative in the right inguinal area and confirmed negative in the left inguinal area.  Genitourinary: Rectum normal, testes normal and penis normal. Rectal exam shows no external hemorrhoid, no internal hemorrhoid, no fissure, no mass, no tenderness and anal tone normal. Guaiac negative stool. Prostate is enlarged (1+ smooth symm BPH). Prostate is not tender. Right testis shows no mass, no swelling and no tenderness. Right testis is descended. Left testis shows no mass, no swelling and no tenderness. Left testis is descended. Circumcised. No penile erythema or penile tenderness. No discharge found.  Musculoskeletal: Normal range of motion. He exhibits no edema and no tenderness.  Lymphadenopathy:    He has no cervical adenopathy.       Right: No inguinal adenopathy present.       Left: No inguinal adenopathy present.  Neurological: He is oriented to person, place, and time.  Skin: Skin is warm and dry. No rash noted. He is not diaphoretic. No erythema. No pallor.  Psychiatric: He has a normal mood and affect. His behavior is normal. Judgment and thought content normal.  Lab Results  Component Value  Date   WBC 6.6 09/04/2013   HGB 15.2 09/04/2013   HCT 45.4 09/04/2013   PLT 178.0 09/04/2013   GLUCOSE 114* 09/04/2013   CHOL 160 04/23/2009   TRIG 94.0 04/23/2009   HDL 32.40* 04/23/2009   LDLCALC 109* 04/23/2009   ALT 29 04/23/2009   AST 39* 04/23/2009   NA 138 09/04/2013   K 4.0 09/04/2013   CL 106 09/04/2013   CREATININE 0.9 09/04/2013   BUN 10 09/04/2013   CO2 26 09/04/2013   TSH 0.98 04/23/2009   PSA 1.04 04/23/2009   INR 2.8 08/03/2012       Assessment & Plan:

## 2013-12-13 ENCOUNTER — Encounter: Payer: Self-pay | Admitting: Internal Medicine

## 2013-12-13 NOTE — Assessment & Plan Note (Addendum)

## 2013-12-13 NOTE — Assessment & Plan Note (Signed)
He has achieved his LDL goal 

## 2013-12-13 NOTE — Assessment & Plan Note (Addendum)
I will recheck his LFT's today and will screen him for Hep C I have also ordered an U/S to look for mass

## 2013-12-13 NOTE — Assessment & Plan Note (Signed)
He has an excellent volume status

## 2013-12-13 NOTE — Assessment & Plan Note (Signed)
His BP is well controlled I will check his lytes and renal function today 

## 2013-12-20 ENCOUNTER — Ambulatory Visit
Admission: RE | Admit: 2013-12-20 | Discharge: 2013-12-20 | Disposition: A | Discharge status: Home / Self Care | Payer: Commercial Managed Care - HMO | Source: Ambulatory Visit | Attending: Internal Medicine | Admitting: Internal Medicine

## 2013-12-20 ENCOUNTER — Encounter: Payer: Self-pay | Admitting: Internal Medicine

## 2013-12-20 DIAGNOSIS — R7401 Elevation of levels of liver transaminase levels: Secondary | ICD-10-CM

## 2013-12-20 DIAGNOSIS — R740 Nonspecific elevation of levels of transaminase and lactic acid dehydrogenase [LDH]: Principal | ICD-10-CM

## 2014-01-15 ENCOUNTER — Other Ambulatory Visit: Payer: Self-pay

## 2014-01-15 MED ORDER — POTASSIUM CHLORIDE ER 10 MEQ PO CPCR: ORAL_CAPSULE | ORAL | Status: DC

## 2014-01-15 MED ORDER — SIMVASTATIN 40 MG PO TABS: ORAL_TABLET | ORAL | Status: DC

## 2014-02-14 ENCOUNTER — Other Ambulatory Visit: Payer: Self-pay | Admitting: *Deleted

## 2014-02-14 MED ORDER — RIVAROXABAN 20 MG PO TABS: ORAL_TABLET | ORAL | Status: DC

## 2014-03-05 ENCOUNTER — Ambulatory Visit (INDEPENDENT_AMBULATORY_CARE_PROVIDER_SITE_OTHER): Payer: Commercial Managed Care - HMO | Admitting: Cardiology

## 2014-03-05 ENCOUNTER — Encounter: Payer: Self-pay | Admitting: Cardiology

## 2014-03-05 ENCOUNTER — Other Ambulatory Visit: Payer: Self-pay | Admitting: *Deleted

## 2014-03-05 VITALS — BP 132/80 | HR 88 | Ht 73.0 in | Wt 244.5 lb

## 2014-03-05 DIAGNOSIS — I4891 Unspecified atrial fibrillation: Secondary | ICD-10-CM

## 2014-03-05 DIAGNOSIS — I080 Rheumatic disorders of both mitral and aortic valves: Secondary | ICD-10-CM

## 2014-03-05 DIAGNOSIS — I10 Essential (primary) hypertension: Secondary | ICD-10-CM

## 2014-03-05 DIAGNOSIS — I428 Other cardiomyopathies: Secondary | ICD-10-CM

## 2014-03-05 DIAGNOSIS — I4819 Other persistent atrial fibrillation: Secondary | ICD-10-CM

## 2014-03-05 MED ORDER — FUROSEMIDE 40 MG PO TABS: 40.0000 mg | ORAL_TABLET | Freq: Every day | ORAL | Status: DC | PRN

## 2014-03-05 NOTE — Patient Instructions (Signed)
Take lasix 40 mg as needed for swelling  Your physician recommends that you schedule a follow-up appointment in:  One year with Dr. Percival Spanish

## 2014-03-05 NOTE — Progress Notes (Signed)
HPI The patient presents for follow up of atrial fibrillation.  He is status post ablation but has reverted back to atrial fib.  He was in this rhythm the last time I saw him and is still in this.  He does feel palpitations mostly at night.  However, he has no severe symptoms.  The patient denies any new symptoms such as chest discomfort, neck or arm discomfort. There has been no new shortness of breath, PND or orthopnea. There has been no reported presyncope or syncope.  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     No Known Allergies  Current Outpatient Prescriptions  Medication Sig Dispense Refill  . benazepril (LOTENSIN) 10 MG tablet Take 1 tablet (10 mg total) by mouth daily.  90 tablet  3  . fish oil-omega-3 fatty acids 1000 MG capsule Take 1 g by mouth once a week.      . furosemide (LASIX) 40 MG tablet Take 40 mg by mouth 2 (two) times daily as needed. For swelling      . metoprolol (LOPRESSOR) 100 MG tablet take 1 tablet by mouth twice a day  180 tablet  1  . potassium chloride (MICRO-K) 10 MEQ CR capsule take 1 capsule by mouth once daily  30 capsule  6  . rivaroxaban (XARELTO) 20 MG TABS tablet take 1 tablet by mouth once daily  30 tablet  0  . simvastatin (ZOCOR) 40 MG tablet take 1 tablet by mouth every evening  30 tablet  6   No current facility-administered medications for this visit.    Past Medical History  Diagnosis Date  . Persistent atrial fibrillation     s/p ablation 4/16  Dr. Rayann Heman  . Nonischemic cardiomyopathy   . Dyslipidemia   . Keloid   . DJD (degenerative joint disease) of hip   . HTN (hypertension)   . Mild mitral regurgitation by prior echocardiogram   . Hx of adenomatous colonic polyps   . Hx of colonoscopy   . Chronic systolic dysfunction of left ventricle     Past Surgical History  Procedure Laterality Date  . Femoral hernia repair      at age 24  . Tee without cardioversion  10/10/2011   Procedure: TRANSESOPHAGEAL ECHOCARDIOGRAM (TEE);  Surgeon: Fay Records, MD;  Location: [facility] ENDOSCOPY;  Service: Cardiovascular;  Laterality: N/A;  . Hernia repair      ROS:  As stated in the HPI and negative for all other systems.  PHYSICAL EXAM BP 132/80  Pulse 88  Ht 6\' 1"  (1.854 m)  Wt 244 lb 8 oz (110.904 kg)  BMI 32.26 kg/m2 GENERAL:  Well appearing NECK:  No jugular venous distention, waveform within normal limits, carotid upstroke brisk and symmetric, no bruits, no thyromegaly LUNGS:  Clear to auscultation bilaterally CHEST:  Unremarkable HEART:  PMI not displaced or sustained,S1 and S2 within normal limits, no S3, no clicks, no rubs, no murmurs, irregular ABD:  Flat, positive bowel sounds normal in frequency in pitch, no bruits, no rebound, no guarding, no midline pulsatile mass, no hepatomegaly, no splenomegaly EXT:  2 plus pulses throughout, no edema, no cyanosis no clubbing SKIN:  Keloids  EKG: Atrial fibrillation, rate 88, axis normal limits, intervals within normal limits, no acute ST-T wave changes. 03/05/2014   ASSESSMENT AND PLAN  Persistent atrial fibrillation -  The patient  tolerates this rhythm and rate control and anticoagulation. We will continue with the  meds as listed.  Cardiomyopathy, nonischemic -  His last EF was normal.  He will continue the meds as listed.   HTN - The blood pressure is at target. No change in medications is indicated. We will continue with therapeutic lifestyle changes (TLC).

## 2014-03-07 ENCOUNTER — Ambulatory Visit (INDEPENDENT_AMBULATORY_CARE_PROVIDER_SITE_OTHER): Payer: Commercial Managed Care - HMO | Admitting: *Deleted

## 2014-03-07 DIAGNOSIS — I480 Paroxysmal atrial fibrillation: Secondary | ICD-10-CM

## 2014-03-07 DIAGNOSIS — I4891 Unspecified atrial fibrillation: Secondary | ICD-10-CM

## 2014-03-07 LAB — CBC
HCT: 44.8 % (ref 39.0–52.0)
HEMOGLOBIN: 15.4 g/dL (ref 13.0–17.0)
MCHC: 34.3 g/dL (ref 30.0–36.0)
MCV: 97.1 fl (ref 78.0–100.0)
Platelets: 177 10*3/uL (ref 150.0–400.0)
RBC: 4.62 Mil/uL (ref 4.22–5.81)
RDW: 13.7 % (ref 11.5–15.5)
WBC: 7 10*3/uL (ref 4.0–10.5)

## 2014-03-07 LAB — BASIC METABOLIC PANEL
BUN: 8 mg/dL (ref 6–23)
CHLORIDE: 104 meq/L (ref 96–112)
CO2: 28 mEq/L (ref 19–32)
CREATININE: 0.9 mg/dL (ref 0.4–1.5)
Calcium: 9.3 mg/dL (ref 8.4–10.5)
GFR: 101.7 mL/min (ref >60.00)
Glucose, Bld: 94 mg/dL (ref 70–99)
Potassium: 4.4 mEq/L (ref 3.5–5.1)
Sodium: 136 mEq/L (ref 135–145)

## 2014-03-07 NOTE — Progress Notes (Signed)
Pt was started on Xarelto for Afib on 08/03/2012.    Reviewed patients medication list.  Pt is not currently on any combined P-gp and strong CYP3A4 inhibitors/inducers (ketoconazole, traconazole, ritonavir, carbamazepine, phenytoin, rifampin, St. Kelly's wort).  Reviewed labs.  SCr 0.9, Weight 110 Kg,  CrCl- 116.  Dose appropriate based on CrCl.   Hgb and HCT WNL.   A full discussion of the nature of anticoagulants has been carried out.  A benefit/risk analysis has been presented to the patient, so that they understand the justification for choosing anticoagulation with Xarelto at this time.  The need for compliance is stressed.  Pt is aware to take the medication once daily with the largest meal of the day.  Side effects of potential bleeding are discussed, including unusual colored urine or stools, coughing up blood or coffee ground emesis, nose bleeds or serious fall or head trauma.  Discussed signs and symptoms of stroke. The patient should avoid any OTC items containing aspirin or ibuprofen.  Avoid alcohol consumption.   Call if any signs of abnormal bleeding.  Discussed financial obligations and resolved any difficulty in obtaining medication.  Next lab test test in 6 months.

## 2014-03-07 NOTE — Patient Instructions (Signed)

## 2014-03-20 ENCOUNTER — Other Ambulatory Visit: Payer: Self-pay | Admitting: Cardiology

## 2014-03-20 ENCOUNTER — Other Ambulatory Visit: Payer: Self-pay

## 2014-03-20 MED ORDER — RIVAROXABAN 20 MG PO TABS: ORAL_TABLET | ORAL | Status: DC

## 2014-03-20 NOTE — Telephone Encounter (Signed)
Pt says he need his Xarelto called to [place].Pt says he need this today.

## 2014-04-23 ENCOUNTER — Other Ambulatory Visit: Payer: Self-pay | Admitting: Cardiology

## 2014-06-05 ENCOUNTER — Encounter (HOSPITAL_COMMUNITY): Payer: Self-pay | Admitting: Internal Medicine

## 2014-06-12 ENCOUNTER — Other Ambulatory Visit: Payer: Self-pay | Admitting: Cardiology

## 2014-08-07 ENCOUNTER — Telehealth: Payer: Self-pay

## 2014-08-07 NOTE — Telephone Encounter (Signed)
LM with spouse to have pt call us back.   Pt was in [facility] preparing sisters funeral.   RE: Flu vaccine for 2015/2016

## 2014-09-10 ENCOUNTER — Ambulatory Visit (INDEPENDENT_AMBULATORY_CARE_PROVIDER_SITE_OTHER): Payer: Commercial Managed Care - HMO | Admitting: *Deleted

## 2014-09-10 DIAGNOSIS — I4891 Unspecified atrial fibrillation: Secondary | ICD-10-CM

## 2014-09-10 DIAGNOSIS — Z5181 Encounter for therapeutic drug level monitoring: Secondary | ICD-10-CM

## 2014-09-10 LAB — CBC
HCT: 45.1 % (ref 39.0–52.0)
Hemoglobin: 15.6 g/dL (ref 13.0–17.0)
MCHC: 34.7 g/dL (ref 30.0–36.0)
MCV: 95.2 fl (ref 78.0–100.0)
PLATELETS: 174 10*3/uL (ref 150.0–400.0)
RBC: 4.74 Mil/uL (ref 4.22–5.81)
RDW: 13.4 % (ref 11.5–15.5)
WBC: 6.2 10*3/uL (ref 4.0–10.5)

## 2014-09-10 LAB — BASIC METABOLIC PANEL
BUN: 12 mg/dL (ref 6–23)
CALCIUM: 9.5 mg/dL (ref 8.4–10.5)
CO2: 29 mEq/L (ref 19–32)
Chloride: 105 mEq/L (ref 96–112)
Creatinine, Ser: 0.93 mg/dL (ref 0.40–1.50)
GFR: 102.82 mL/min (ref >60.00)
Glucose, Bld: 109 mg/dL — ABNORMAL HIGH (ref 70–99)
Potassium: 3.8 mEq/L (ref 3.5–5.1)
SODIUM: 137 meq/L (ref 135–145)

## 2014-09-10 NOTE — Progress Notes (Signed)
Pt was started on Xarelto for AFIB on 08/03/2012.    Reviewed patients medication list.  Pt is not currently on any combined P-gp and strong CYP3A4 inhibitors/inducers (ketoconazole, traconazole, ritonavir, carbamazepine, phenytoin, rifampin, St. Isay's wort).  Reviewed labs.  SCr 0.93, Weight 244 lbs, CrCl-114.28.  Dose appropriate based on CrCl.   Creatinine 0.93, Hgb 15.6 and HCT 45.1  A full discussion of the nature of anticoagulants has been carried out.  A benefit/risk analysis has been presented to the patient, so that they understand the justification for choosing anticoagulation with Xarelto at this time.  The need for compliance is stressed.  Pt is aware to take the medication once daily with the largest meal of the day.  Side effects of potential bleeding are discussed, including unusual colored urine or stools, coughing up blood or coffee ground emesis, nose bleeds or serious fall or head trauma.  Discussed signs and symptoms of stroke. The patient should avoid any OTC items containing aspirin or ibuprofen.  Avoid alcohol consumption.   Call if any signs of abnormal bleeding.  Discussed financial obligations and resolved any difficulty in obtaining medication.    Called patient and informed him of his lab results.  Advised that Dr. Percival Spanish will continue to follow; he verbalized understanding.

## 2014-09-10 NOTE — Patient Instructions (Addendum)
A full discussion of the nature of anticoagulants has been carried out.  A benefit/risk analysis has been presented to the patient, so that they understand the justification for choosing anticoagulation with Xarelto at this time.  The need for compliance is stressed.  Pt is aware to take the medication once daily with the largest meal of the day.  Side effects of potential bleeding are discussed, including unusual colored urine or stools, coughing up blood or coffee ground emesis, nose bleeds or serious fall or head trauma.  Discussed signs and symptoms of stroke. The patient should avoid any OTC items containing aspirin or ibuprofen.  Avoid alcohol consumption.   Call if any signs of abnormal bleeding.  Discussed financial obligations and resolved any difficulty in obtaining medication.      

## 2014-09-11 ENCOUNTER — Telehealth: Payer: Self-pay

## 2014-09-11 NOTE — Telephone Encounter (Signed)
Left voicemail for pt to call back to schedule if he still wants flu vaccine

## 2014-09-30 ENCOUNTER — Other Ambulatory Visit: Payer: Self-pay | Admitting: Cardiology

## 2014-10-17 ENCOUNTER — Other Ambulatory Visit: Payer: Self-pay | Admitting: Cardiology

## 2015-02-11 ENCOUNTER — Encounter: Payer: Self-pay | Admitting: Internal Medicine

## 2015-03-30 ENCOUNTER — Other Ambulatory Visit: Payer: Self-pay | Admitting: Cardiology

## 2015-04-18 ENCOUNTER — Other Ambulatory Visit: Payer: Self-pay | Admitting: Cardiology

## 2015-04-24 ENCOUNTER — Ambulatory Visit (INDEPENDENT_AMBULATORY_CARE_PROVIDER_SITE_OTHER): Payer: Commercial Managed Care - HMO | Admitting: Cardiology

## 2015-04-24 ENCOUNTER — Encounter: Payer: Self-pay | Admitting: Cardiology

## 2015-04-24 VITALS — BP 142/90 | HR 86 | Ht 73.75 in | Wt 240.0 lb

## 2015-04-24 DIAGNOSIS — I4819 Other persistent atrial fibrillation: Secondary | ICD-10-CM

## 2015-04-24 DIAGNOSIS — I481 Persistent atrial fibrillation: Secondary | ICD-10-CM | POA: Diagnosis not present

## 2015-04-24 NOTE — Patient Instructions (Signed)
Your physician wants you to follow-up in: 1 Year. You will receive a reminder letter in the mail two months in advance. If you don't receive a letter, please call our office to schedule the follow-up appointment.  Your physician has recommended that you wear a holter monitor. Holter monitors are medical devices that record the heart's electrical activity. Doctors most often use these monitors to diagnose arrhythmias. Arrhythmias are problems with the speed or rhythm of the heartbeat. The monitor is a small, portable device. You can wear one while you do your normal daily activities. This is usually used to diagnose what is causing palpitations/syncope (passing out).

## 2015-04-24 NOTE — Progress Notes (Signed)
HPI The patient presents for follow up of atrial fibrillation.  He is status post ablation but has reverted back to atrial fib.  He does not feel this. The patient denies any new symptoms such as chest discomfort, neck or arm discomfort. There has been no new shortness of breath, PND or orthopnea. There have been no reported palpitations, presyncope or syncope.   He has an active physical job and tolerates this. He tolerates anticoagulation without any evidence of bleeding.    No Known Allergies  Current Outpatient Prescriptions  Medication Sig Dispense Refill  . benazepril (LOTENSIN) 10 MG tablet take 1 tablet by mouth once daily 90 tablet 2  . fish oil-omega-3 fatty acids 1000 MG capsule Take 1 g by mouth once a week.    . furosemide (LASIX) 40 MG tablet Take 1 tablet (40 mg total) by mouth daily as needed. Take as needed for swelling 30 tablet 4  . metoprolol (LOPRESSOR) 100 MG tablet take 1 tablet by mouth twice a day 180 tablet 3  . potassium chloride (MICRO-K) 10 MEQ CR capsule take 1 capsule by mouth once daily 30 capsule 3  . rivaroxaban (XARELTO) 20 MG TABS tablet take 1 tablet by mouth once daily 30 tablet 6  . simvastatin (ZOCOR) 40 MG tablet take 1 tablet by mouth at bedtime 30 tablet 3   No current facility-administered medications for this visit.    Past Medical History  Diagnosis Date  . Persistent atrial fibrillation [facility])     s/p ablation 4/16  Dr. Rayann Heman  . Nonischemic cardiomyopathy ([place])   . Dyslipidemia   . Keloid   . DJD (degenerative joint disease) of hip   . HTN (hypertension)   . Mild mitral regurgitation by prior echocardiogram   . Hx of adenomatous colonic polyps   . Hx of colonoscopy   . Chronic systolic dysfunction of left ventricle     Past Surgical History  Procedure Laterality Date  . Femoral hernia repair      at age 74  . Tee without cardioversion  10/10/2011    Procedure: TRANSESOPHAGEAL ECHOCARDIOGRAM (TEE);  Surgeon: Fay Records, MD;   Location: [place];  Service: Cardiovascular;  Laterality: N/A;  . Hernia repair    . Atrial fibrillation ablation N/A 10/11/2011    Procedure: ATRIAL FIBRILLATION ABLATION;  Surgeon: Thompson Grayer, MD;  Location: [facility] CATH LAB;  Service: Cardiovascular;  Laterality: N/A;    ROS:  As stated in the HPI and negative for all other systems.  PHYSICAL EXAM BP 142/90 mmHg  Pulse 86  Ht 6' 1.75" (1.873 m)  Wt 240 lb (108.863 kg)  BMI 31.03 kg/m2 GENERAL:  Well appearing NECK:  No jugular venous distention, waveform within normal limits, carotid upstroke brisk and symmetric, no bruits, no thyromegaly LUNGS:  Clear to auscultation bilaterally CHEST:  Unremarkable HEART:  PMI not displaced or sustained,S1 and S2 within normal limits, no S3, no clicks, no rubs, no murmurs, irregular ABD:  Flat, positive bowel sounds normal in frequency in pitch, no bruits, no rebound, no guarding, no midline pulsatile mass, no hepatomegaly, no splenomegaly EXT:  2 plus pulses throughout, no edema, no cyanosis no clubbing SKIN:  Keloids  EKG: Atrial fibrillation, rate 86, axis normal limits, intervals within normal limits, no acute ST-T wave changes. 04/24/2015   ASSESSMENT AND PLAN  Persistent atrial fibrillation -  The patient  tolerates this rhythm and rate control and anticoagulation. We will continue with the meds as listed.  I will just 24-hour Holter to see if he has adequate rate control because his heart rate did go up to walk quickly around the office.  Cardiomyopathy, nonischemic -  His last EF was normal.  He will continue the meds as listed.   HTN - The blood pressure is slightly elevated but this is unusual. No change in medications is indicated. We will continue with therapeutic lifestyle changes (TLC).

## 2015-04-27 ENCOUNTER — Ambulatory Visit (INDEPENDENT_AMBULATORY_CARE_PROVIDER_SITE_OTHER): Payer: Commercial Managed Care - HMO

## 2015-04-27 DIAGNOSIS — I4819 Other persistent atrial fibrillation: Secondary | ICD-10-CM

## 2015-04-27 DIAGNOSIS — I481 Persistent atrial fibrillation: Secondary | ICD-10-CM | POA: Diagnosis not present

## 2015-04-27 NOTE — Progress Notes (Unsigned)
Patient presents for placement of 24 hour holter monitor per Dr. Percival Spanish. Monitor placed and explained to patient. He states he will return the monitor tomorrow Nov. 1.

## 2015-06-19 ENCOUNTER — Other Ambulatory Visit: Payer: Self-pay | Admitting: Cardiology

## 2015-06-19 NOTE — Telephone Encounter (Signed)
Rx request sent to pharmacy.  

## 2015-07-20 ENCOUNTER — Encounter: Payer: Self-pay | Admitting: Cardiology

## 2015-07-27 ENCOUNTER — Other Ambulatory Visit: Payer: Self-pay | Admitting: Cardiology

## 2015-07-27 NOTE — Telephone Encounter (Signed)
Rx request sent to pharmacy.  

## 2015-11-18 ENCOUNTER — Other Ambulatory Visit: Payer: Self-pay | Admitting: Cardiology

## 2015-11-18 ENCOUNTER — Other Ambulatory Visit: Payer: Self-pay | Admitting: *Deleted

## 2015-11-18 ENCOUNTER — Other Ambulatory Visit: Payer: Self-pay | Admitting: Pharmacist Clinician (PhC)/ Clinical Pharmacy Specialist

## 2015-11-18 MED ORDER — RIVAROXABAN 20 MG PO TABS: ORAL_TABLET | ORAL | Status: DC

## 2015-11-18 MED ORDER — SIMVASTATIN 40 MG PO TABS: 40.0000 mg | ORAL_TABLET | Freq: Every day | ORAL | Status: DC

## 2015-11-18 NOTE — Telephone Encounter (Signed)
Rx(s) sent to pharmacy electronically.  

## 2015-12-15 ENCOUNTER — Other Ambulatory Visit: Payer: Self-pay

## 2015-12-15 MED ORDER — POTASSIUM CHLORIDE ER 10 MEQ PO CPCR: 10.0000 meq | ORAL_CAPSULE | Freq: Every day | ORAL | Status: DC

## 2015-12-15 NOTE — Telephone Encounter (Signed)
Rx(s) sent to pharmacy electronically.  

## 2016-04-11 ENCOUNTER — Other Ambulatory Visit: Payer: Self-pay | Admitting: Cardiology

## 2016-05-02 ENCOUNTER — Other Ambulatory Visit: Payer: Self-pay | Admitting: Cardiology

## 2016-05-11 ENCOUNTER — Other Ambulatory Visit (INDEPENDENT_AMBULATORY_CARE_PROVIDER_SITE_OTHER): Payer: Commercial Managed Care - HMO

## 2016-05-11 ENCOUNTER — Ambulatory Visit (INDEPENDENT_AMBULATORY_CARE_PROVIDER_SITE_OTHER): Payer: Commercial Managed Care - HMO | Admitting: Internal Medicine

## 2016-05-11 ENCOUNTER — Other Ambulatory Visit: Payer: Self-pay | Admitting: Cardiology

## 2016-05-11 ENCOUNTER — Encounter: Payer: Self-pay | Admitting: Internal Medicine

## 2016-05-11 VITALS — BP 120/78 | HR 85 | Temp 98.7°F | Ht 73.75 in | Wt 237.2 lb

## 2016-05-11 DIAGNOSIS — E785 Hyperlipidemia, unspecified: Secondary | ICD-10-CM

## 2016-05-11 DIAGNOSIS — Z0000 Encounter for general adult medical examination without abnormal findings: Secondary | ICD-10-CM | POA: Diagnosis not present

## 2016-05-11 DIAGNOSIS — I10 Essential (primary) hypertension: Secondary | ICD-10-CM

## 2016-05-11 DIAGNOSIS — R740 Nonspecific elevation of levels of transaminase and lactic acid dehydrogenase [LDH]: Secondary | ICD-10-CM

## 2016-05-11 DIAGNOSIS — Z86010 Personal history of colonic polyps: Secondary | ICD-10-CM

## 2016-05-11 DIAGNOSIS — N400 Enlarged prostate without lower urinary tract symptoms: Secondary | ICD-10-CM

## 2016-05-11 DIAGNOSIS — R7303 Prediabetes: Secondary | ICD-10-CM | POA: Insufficient documentation

## 2016-05-11 DIAGNOSIS — R7402 Elevation of levels of lactic acid dehydrogenase (LDH): Secondary | ICD-10-CM

## 2016-05-11 DIAGNOSIS — I481 Persistent atrial fibrillation: Secondary | ICD-10-CM

## 2016-05-11 DIAGNOSIS — Z23 Encounter for immunization: Secondary | ICD-10-CM | POA: Diagnosis not present

## 2016-05-11 DIAGNOSIS — I4819 Other persistent atrial fibrillation: Secondary | ICD-10-CM

## 2016-05-11 LAB — LIPID PANEL
CHOL/HDL RATIO: 5
CHOLESTEROL: 168 mg/dL (ref 0–200)
HDL: 36.1 mg/dL — AB (ref >39.00)
LDL CALC: 122 mg/dL — AB (ref 0–99)
NonHDL: 132.11
TRIGLYCERIDES: 52 mg/dL (ref 0.0–149.0)
VLDL: 10.4 mg/dL (ref 0.0–40.0)

## 2016-05-11 LAB — URINALYSIS, ROUTINE W REFLEX MICROSCOPIC
Bilirubin Urine: NEGATIVE
Ketones, ur: NEGATIVE
LEUKOCYTES UA: NEGATIVE
Nitrite: NEGATIVE
PH: 6.5 (ref 5.0–8.0)
SPECIFIC GRAVITY, URINE: 1.015 (ref 1.000–1.030)
URINE GLUCOSE: NEGATIVE
Urobilinogen, UA: 1 (ref 0.0–1.0)

## 2016-05-11 LAB — CBC WITH DIFFERENTIAL/PLATELET
BASOS ABS: 0 10*3/uL (ref 0.0–0.1)
BASOS PCT: 0.5 % (ref 0.0–3.0)
EOS PCT: 1.9 % (ref 0.0–5.0)
Eosinophils Absolute: 0.2 10*3/uL (ref 0.0–0.7)
HEMATOCRIT: 45.2 % (ref 39.0–52.0)
Hemoglobin: 15.2 g/dL (ref 13.0–17.0)
LYMPHS ABS: 2.2 10*3/uL (ref 0.7–4.0)
LYMPHS PCT: 27.7 % (ref 12.0–46.0)
MCHC: 33.7 g/dL (ref 30.0–36.0)
MCV: 98 fl (ref 78.0–100.0)
MONOS PCT: 9.5 % (ref 3.0–12.0)
Monocytes Absolute: 0.8 10*3/uL (ref 0.1–1.0)
NEUTROS ABS: 4.9 10*3/uL (ref 1.4–7.7)
NEUTROS PCT: 60.4 % (ref 43.0–77.0)
PLATELETS: 192 10*3/uL (ref 150.0–400.0)
RBC: 4.61 Mil/uL (ref 4.22–5.81)
RDW: 13.9 % (ref 11.5–15.5)
WBC: 8.1 10*3/uL (ref 4.0–10.5)

## 2016-05-11 LAB — BASIC METABOLIC PANEL
BUN: 12 mg/dL (ref 6–23)
CHLORIDE: 102 meq/L (ref 96–112)
CO2: 28 meq/L (ref 19–32)
CREATININE: 0.86 mg/dL (ref 0.40–1.50)
Calcium: 9.7 mg/dL (ref 8.4–10.5)
GFR: 112.01 mL/min (ref >60.00)
Glucose, Bld: 102 mg/dL — ABNORMAL HIGH (ref 70–99)
POTASSIUM: 4.6 meq/L (ref 3.5–5.1)
Sodium: 138 mEq/L (ref 135–145)

## 2016-05-11 LAB — HEPATIC FUNCTION PANEL
ALK PHOS: 213 U/L — AB (ref 39–117)
ALT: 19 U/L (ref 0–53)
AST: 24 U/L (ref 0–37)
Albumin: 4.4 g/dL (ref 3.5–5.2)
Bilirubin, Direct: 0.2 mg/dL (ref 0.0–0.3)
TOTAL PROTEIN: 7.7 g/dL (ref 6.0–8.3)
Total Bilirubin: 1.2 mg/dL (ref 0.2–1.2)

## 2016-05-11 LAB — PSA: PSA: 0.77 ng/mL (ref 0.10–4.00)

## 2016-05-11 LAB — HEMOGLOBIN A1C: Hgb A1c MFr Bld: 6.1 % (ref 4.6–6.5)

## 2016-05-11 LAB — TSH: TSH: 1.27 u[IU]/mL (ref 0.35–4.50)

## 2016-05-11 NOTE — Progress Notes (Signed)
Pre visit review using our clinic review tool, if applicable. No additional management support is needed unless otherwise documented below in the visit note. 

## 2016-05-11 NOTE — Progress Notes (Signed)
Subjective:  Patient ID: Edward Pittman, male    DOB: 1940-07-05  Age: 75 y.o. MRN: [id number]  CC: Annual Exam; Hyperlipidemia; and Hypertension   HPI Matthieu H Broadhead presents for CPX/AWV.  He tells me his blood pressure has been well controlled on the diuretic, ACE inhibitor, and beta blocker. He denies any recent episodes of palpitations, syncope, DOE, CP, SOB, edema, or fatigue.  Past Medical History:  Diagnosis Date  . Chronic systolic dysfunction of left ventricle   . DJD (degenerative joint disease) of hip   . Dyslipidemia   . HTN (hypertension)   . Hx of adenomatous colonic polyps   . Hx of colonoscopy   . Keloid   . Mild mitral regurgitation by prior echocardiogram   . Nonischemic cardiomyopathy ([place])   . Persistent atrial fibrillation [facility])    s/p ablation 4/16  Dr. Rayann Heman   Past Surgical History:  Procedure Laterality Date  . ATRIAL FIBRILLATION ABLATION N/A 10/11/2011   Procedure: ATRIAL FIBRILLATION ABLATION;  Surgeon: Thompson Grayer, MD;  Location: [facility] C[facility] LAB;  Service: Cardiovascular;  Laterality: N/A;  . FEMORAL HERNIA REPAIR     at age 88  . HERNIA REPAIR    . TEE WITHOUT CARDIOVERSION  10/10/2011   Procedure: TRANSESOPHAGEAL ECHOCARDIOGRAM (TEE);  Surgeon: Fay Records, MD;  Location: [facility] ENDOSCOPY;  Service: Cardiovascular;  Laterality: N/A;    reports that he quit smoking about 16 years ago. He has never used smokeless tobacco. He reports that he does not drink alcohol or use drugs. family history includes Brain cancer in his mother and sister; Breast cancer in his mother and sister; Diabetes in his sister and sister; Heart attack in his father; Lung cancer in his brother and brother. No Known Allergies  Outpatient Medications Prior to Visit  Medication Sig Dispense Refill  . benazepril (LOTENSIN) 10 MG tablet take 1 tablet by mouth once daily 90 tablet 2  . fish oil-omega-3 fatty acids 1000 MG capsule Take 1 g by mouth once a week.    . furosemide  (LASIX) 40 MG tablet Take 1 tablet (40 mg total) by mouth daily as needed. Take as needed for swelling 30 tablet 4  . metoprolol (LOPRESSOR) 100 MG tablet take 1 tablet by mouth twice a day 180 tablet 3  . potassium chloride (MICRO-K) 10 MEQ CR capsule Take 1 capsule (10 mEq total) by mouth daily. 30 capsule 4  . rivaroxaban (XARELTO) 20 MG TABS tablet Take 1 tablet (20 mg total) by mouth daily with supper. take 1 tablet by mouth once daily 30 tablet 5  . simvastatin (ZOCOR) 40 MG tablet take 1 tablet by mouth at bedtime 30 tablet 5   No facility-administered medications prior to visit.     ROS Review of Systems  Constitutional: Negative.  Negative for activity change, appetite change, diaphoresis and fatigue.  HENT: Negative.  Negative for trouble swallowing.   Eyes: Negative for visual disturbance.  Respiratory: Negative.  Negative for apnea, cough, choking, shortness of breath, wheezing and stridor.   Cardiovascular: Negative.  Negative for chest pain, palpitations and leg swelling.  Gastrointestinal: Negative.  Negative for abdominal pain, blood in stool, constipation, diarrhea, nausea and vomiting.  Endocrine: Negative.  Negative for cold intolerance, heat intolerance, polydipsia, polyphagia and polyuria.  Genitourinary: Negative.  Negative for difficulty urinating, penile pain, penile swelling, scrotal swelling, testicular pain and urgency.  Musculoskeletal: Negative.  Negative for back pain, myalgias and neck pain.  Skin: Negative.  Negative for  color change.  Allergic/Immunologic: Negative.   Neurological: Negative.  Negative for dizziness, syncope and light-headedness.  Hematological: Negative.  Negative for adenopathy. Does not bruise/bleed easily.  Psychiatric/Behavioral: Negative.     Objective:  BP 120/78 (BP Location: Left Arm, Patient Position: Sitting, Cuff Size: Normal)   Pulse 85   Temp 98.7 F (37.1 C) (Oral)   Ht 6' 1.75" (1.873 m)   Wt 237 lb 4 oz (107.6 kg)    SpO2 96%   BMI 30.67 kg/m   BP Readings from Last 3 Encounters:  05/11/16 120/78  04/24/15 (!) 142/90  03/05/14 132/80    Wt Readings from Last 3 Encounters:  05/11/16 237 lb 4 oz (107.6 kg)  04/24/15 240 lb (108.9 kg)  03/05/14 244 lb 8 oz (110.9 kg)    Physical Exam  Constitutional: He appears well-developed and well-nourished. No distress.  HENT:  Head: Normocephalic and atraumatic.  Mouth/Throat: Oropharynx is clear and moist. No oropharyngeal exudate.  Eyes: Conjunctivae are normal. Right eye exhibits no discharge. Left eye exhibits no discharge. No scleral icterus.  Neck: Normal range of motion. Neck supple. No JVD present. No tracheal deviation present. No thyromegaly present.  Cardiovascular: Normal rate, normal heart sounds and intact distal pulses.  An irregularly irregular rhythm present. Exam reveals no gallop and no friction rub.   No murmur heard. Pulmonary/Chest: Effort normal and breath sounds normal. No stridor. No respiratory distress. He has no wheezes. He has no rales. He exhibits no tenderness.  Abdominal: Soft. Bowel sounds are normal. He exhibits no distension and no mass. There is no tenderness. There is no rebound and no guarding. Hernia confirmed negative in the right inguinal area and confirmed negative in the left inguinal area.  Genitourinary: Rectum normal, testes normal and penis normal. Rectal exam shows no external hemorrhoid, no internal hemorrhoid, no fissure, no mass, no tenderness, anal tone normal and guaiac negative stool. Prostate is enlarged (1+ smooth symm BPH). Prostate is not tender. Right testis shows no mass, no swelling and no tenderness. Left testis shows no mass, no swelling and no tenderness. Left testis is descended. Circumcised. No penile erythema or penile tenderness. No discharge found.  Lymphadenopathy:    He has no cervical adenopathy.       Right: No inguinal adenopathy present.       Left: No inguinal adenopathy present.  Skin:  He is not diaphoretic.  Vitals reviewed.   Lab Results  Component Value Date   WBC 8.1 05/11/2016   HGB 15.2 05/11/2016   HCT 45.2 05/11/2016   PLT 192.0 05/11/2016   GLUCOSE 102 (H) 05/11/2016   CHOL 168 05/11/2016   TRIG 52.0 05/11/2016   HDL 36.10 (L) 05/11/2016   LDLCALC 122 (H) 05/11/2016   ALT 19 05/11/2016   AST 24 05/11/2016   NA 138 05/11/2016   K 4.6 05/11/2016   CL 102 05/11/2016   CREATININE 0.86 05/11/2016   BUN 12 05/11/2016   CO2 28 05/11/2016   TSH 1.27 05/11/2016   PSA 0.77 05/11/2016   INR 2.8 08/03/2012   HGBA1C 6.1 05/11/2016    US Abdomen Complete  Result Date: 12/20/2013 CLINICAL DATA:  Increased LFTs EXAM: ULTRASOUND ABDOMEN COMPLETE COMPARISON:  None. FINDINGS: Gallbladder: No gallstones or wall thickening visualized. No sonographic Murphy sign noted. Common bile duct: Diameter: 4.8 mm in diameter within normal limits. Liver: No focal lesion identified. Diffuse increased echogenicity of the liver suspicious for fatty infiltration. IVC: Not seen due to abundant bowel gas  Pancreas: Not seen due to abundant bowel gas Spleen: Size and appearance within normal limits. Measures 3.4 cm in length. Right Kidney: Length: 11.7 cm. Echogenicity within normal limits. No mass or hydronephrosis visualized. Left Kidney: Length: 12.8 cm. Echogenicity within normal limits. No mass or hydronephrosis visualized. Abdominal aorta: No aneurysm visualized. Measures up to 2.7 cm in diameter. Atherosclerotic plaque are noted mid and distal aorta. Other findings: None. IMPRESSION: 1. No gallstones are noted within gallbladder.  Normal CBD. 2. Diffuse increased echogenicity of the liver suspicious for fatty infiltration. No focal hepatic mass. 3. No hydronephrosis. Electronically Signed   By: Lahoma Crocker M.D.   On: 12/20/2013 10:56    Assessment & Plan:   Rhonin was seen today for annual exam, hyperlipidemia and hypertension.  Diagnoses and all orders for this visit:  Essential  hypertension- his blood pressure is well-controlled, electrolytes and renal function are stable. -     Basic metabolic panel; Future -     CBC with Differential/Platelet; Future -     Urinalysis, Routine w reflex microscopic (not at [facility]); Future  Persistent atrial fibrillation ([place])- he has good rate and rhythm control, will continue anticoagulation and the beta blocker.  Benign prostatic hyperplasia, unspecified whether lower urinary tract symptoms present- his PSA is not elevated so not concerned about prostate cancer. He has no symptoms that need to be treated. -     PSA; Future  Hyperlipidemia with target LDL less than 100- he is achieved his LDL goal is doing well on the statin. -     Lipid panel; Future -     TSH; Future  Prediabetes- his A1c is stable at 6.1%, no medications are needed at this time, he will continue to work on his lifestyle modifications. -     Basic metabolic panel; Future -     Hemoglobin A1c; Future  Nonspecific elevation of levels of transaminase or lactic acid dehydrogenase (LDH)- his liver enzymes are normal now, this is most likely fatty liver disease that has improved with his recent lifestyle modifications. -     Hepatic function panel; Future  Need for prophylactic vaccination against Streptococcus pneumoniae (pneumococcus) -     Pneumococcal polysaccharide vaccine 23-valent greater than or equal to 2yo subcutaneous/IM  History of colonic polyps -     Ambulatory referral to Gastroenterology  Routine general medical examination at a health care facility   I am having Mr. Dietze maintain his fish oil-omega-3 fatty acids, furosemide, metoprolol, benazepril, potassium chloride, rivaroxaban, and simvastatin.  No orders of the defined types were placed in this encounter.  See AVS for instructions about healthy living and anticipatory guidance.  Follow-up: Return in about 6 months (around 11/08/2016).  Scarlette Calico, MD

## 2016-05-11 NOTE — Patient Instructions (Signed)

## 2016-05-12 ENCOUNTER — Encounter: Payer: Self-pay | Admitting: Internal Medicine

## 2016-05-15 NOTE — Assessment & Plan Note (Signed)

## 2016-06-06 ENCOUNTER — Ambulatory Visit (INDEPENDENT_AMBULATORY_CARE_PROVIDER_SITE_OTHER): Payer: Commercial Managed Care - HMO | Admitting: Physician Assistant

## 2016-06-06 ENCOUNTER — Encounter: Payer: Self-pay | Admitting: Physician Assistant

## 2016-06-06 VITALS — BP 124/86 | HR 82 | Ht 73.0 in | Wt 236.0 lb

## 2016-06-06 DIAGNOSIS — Z1211 Encounter for screening for malignant neoplasm of colon: Secondary | ICD-10-CM | POA: Diagnosis not present

## 2016-06-06 DIAGNOSIS — Z860100 Personal history of colon polyps, unspecified: Secondary | ICD-10-CM

## 2016-06-06 NOTE — Patient Instructions (Signed)
There is a recall colonoscopy date in our system of 01-26-2019 with Dr. Silvano Rusk.  You will be notified by letter the month before this date to call our office about scheduling the colonoscopy.

## 2016-06-06 NOTE — Progress Notes (Signed)
Subjective:    Patient ID: Edward Pittman, male    DOB: 06-06-1941, 75 y.o.   MRN: [id number]  HPI Edward Pittman is a pleasant 75 year old African-American male who is referred today by Dr. Scarlette Calico for consideration of follow-up colonoscopy. Patient is known to Dr. Carlean Purl and last had colonoscopy in August 2010 with finding of 2 diminutive polyps and internal hemorrhoids. One of the polyps was hyperplastic and the other was benign colonic mucosa. He was recommended for 10 year interval follow-up. Prior to that he had colonoscopy in 2005 with 2 hyperplastic polyps removed.  Patient has history of hypertension, atrial fibrillation for which he is on Xarelto, a nonischemic cardiomyopathy and BPH. He has no current GI concerns, specifically denies any problems with abdominal pain and changes in bowel habits melena or hematochezia.  Review of Systems Pertinent positive and negative review of systems were noted in the above HPI section.  All other review of systems was otherwise negative.  Outpatient Encounter Prescriptions as of 06/06/2016  Medication Sig  . benazepril (LOTENSIN) 10 MG tablet take 1 tablet by mouth once daily  . fish oil-omega-3 fatty acids 1000 MG capsule Take 1 g by mouth once a week.  . furosemide (LASIX) 40 MG tablet Take 1 tablet (40 mg total) by mouth daily as needed. Take as needed for swelling  . metoprolol (LOPRESSOR) 100 MG tablet take 1 tablet by mouth twice a day  . potassium chloride (MICRO-K) 10 MEQ CR capsule Take 1 capsule (10 mEq total) by mouth daily.  . rivaroxaban (XARELTO) 20 MG TABS tablet Take 1 tablet (20 mg total) by mouth daily with supper. take 1 tablet by mouth once daily  . simvastatin (ZOCOR) 40 MG tablet take 1 tablet by mouth at bedtime   No facility-administered encounter medications on file as of 06/06/2016.    No Known Allergies Patient Active Problem List   Diagnosis Date Noted  . Prediabetes 05/11/2016  . BPH (benign prostatic hyperplasia)  12/12/2013  . Routine general medical examination at a health care facility 12/12/2013  . Nonspecific elevation of levels of transaminase or lactic acid dehydrogenase (LDH) 12/12/2013  . Cardiomyopathy, nonischemic ([place]) 08/09/2011  . History of colonic polyps 02/11/2009  . Hyperlipidemia with target LDL less than 100 07/14/2007  . MITRAL REGURGITATION, MILD 07/14/2007  . Essential hypertension 07/14/2007  . Persistent atrial fibrillation ([place]) 07/14/2007  . DEGENERATIVE JOINT DISEASE, HIPS 07/14/2007   Social History   Social History  . Marital status: Married    Spouse name: N/A  . Number of children: 4  . Years of education: N/A   Occupational History  . Autobody Producer, television/film/video    Social History Main Topics  . Smoking status: Former Smoker    Quit date: 08/09/1999  . Smokeless tobacco: Never Used     Comment: quit 9 years ago  . Alcohol use No  . Drug use: No  . Sexual activity: Not Currently   Other Topics Concern  . Not on file   Social History Narrative  . No narrative on file    [name]'s family history includes Brain cancer in his mother and sister; Breast cancer in his mother and sister; Diabetes in his sister and sister; Heart attack in his father; Lung cancer in his brother and brother.      Objective:    Vitals:   06/06/16 1005  BP: 124/86  Pulse: 82    Physical Exam Well-developed older African-American male in no acute distress, very  pleasant blood pressure 124/86 pulse 82, height 6 foot 1 weight 236, BMI 31.1. HEENT; nontraumatic normocephalic EOMI PERRLA sclera anicteric, Cardiovascular; irregular rate and rhythm with S1-S2 no murmur or gallop, Pulmonary; clear bilaterally, Abdomen; soft nontender nondistended bowel sounds are active there is no palpable mass or hepatosplenomegaly, Rectal ;exam not done, Extremities ;no clubbing cyanosis or edema skin warm and dry, Neuropsych ;mood and affect appropriate      Assessment & Plan:   #69 75 year old  African-American male with history of hyperplastic colon polyps, last colonoscopy August 2010 who is referred today to discuss possible follow-up colonoscopy. Patient is asymptomatic. #2 chronic anticoagulation-on Xarelto #3 atrial fibrillation #4  nonischemic cardiomyopathy #5 hypertension  Plan; Patient does not need follow-up colonoscopy at this time. He will be due for follow-up colonoscopy in August 2020 with Dr. Carlean Purl on a and has been placed on the recall schedule. He will follow up on an as-needed basis in the interim.  Amy Genia Harold PA-C 06/06/2016   Cc: [name], MD

## 2016-06-07 NOTE — Progress Notes (Signed)
Agree with Ms. Esterwood's assessment and plan. Carl E. Gessner, MD, FACG   

## 2016-06-10 ENCOUNTER — Other Ambulatory Visit: Payer: Self-pay | Admitting: Cardiology

## 2016-11-22 ENCOUNTER — Other Ambulatory Visit: Payer: Self-pay | Admitting: Cardiology

## 2016-12-20 ENCOUNTER — Telehealth: Payer: Self-pay | Admitting: Cardiology

## 2016-12-20 MED ORDER — RIVAROXABAN 20 MG PO TABS: 20.0000 mg | ORAL_TABLET | Freq: Every day | ORAL | 0 refills | Status: DC

## 2016-12-20 MED ORDER — BENAZEPRIL HCL 10 MG PO TABS: 10.0000 mg | ORAL_TABLET | Freq: Every day | ORAL | 0 refills | Status: DC

## 2016-12-20 MED ORDER — FUROSEMIDE 40 MG PO TABS: 40.0000 mg | ORAL_TABLET | Freq: Every day | ORAL | 0 refills | Status: DC | PRN

## 2016-12-20 MED ORDER — POTASSIUM CHLORIDE ER 10 MEQ PO CPCR: 10.0000 meq | ORAL_CAPSULE | Freq: Every day | ORAL | 0 refills | Status: DC

## 2016-12-20 MED ORDER — METOPROLOL TARTRATE 100 MG PO TABS: 100.0000 mg | ORAL_TABLET | Freq: Two times a day (BID) | ORAL | 0 refills | Status: DC

## 2016-12-20 MED ORDER — SIMVASTATIN 40 MG PO TABS: 40.0000 mg | ORAL_TABLET | Freq: Every day | ORAL | 0 refills | Status: DC

## 2016-12-20 NOTE — Telephone Encounter (Signed)
Refills sent to the pharmacy electronically.

## 2016-12-20 NOTE — Telephone Encounter (Signed)
New Message   *STAT* If patient is at the pharmacy, call can be transferred to refill team.   1. Which medications need to be refilled? (please list name of each medication and dose if known) Benazepril 10mg , Metoprolol 100mg , Potassium 10 MEQ, Xarelto 20mg , Simvastation 40mg , Furosemide 40mg   2. Which pharmacy/location (including street and city if local pharmacy) is medication to be sent to? [address] [facility]  3. Do they need a 30 day or 90 day supply? [place]

## 2017-02-19 NOTE — Progress Notes (Signed)
Cardiology Office Note   Date:  02/20/2017   ID:  Edward Pittman, DOB Nov 09, 1940, MRN [id number]  PCP:  [name], MD  Cardiologist:   Minus Breeding, MD    Chief Complaint  Patient presents with  . Follow-up    Pt states no Sx.       History of Present Illness: Edward Pittman is a 76 y.o. male who presents for follow up of atrial fib.   He had atrial fib and had ablation but had recurrent fib.    Since I last saw him he has done relatively well.  He does feel his heart beating fast at times when he tries to lie on his left side.  The patient denies any new symptoms such as chest discomfort, neck or arm discomfort. There has been no new shortness of breath, PND or orthopnea. There has been no reported palpitations, presyncope or syncope.       Past Medical History:  Diagnosis Date  . Chronic systolic dysfunction of left ventricle   . DJD (degenerative joint disease) of hip   . Dyslipidemia   . HTN (hypertension)   . Hx of adenomatous colonic polyps   . Hx of colonoscopy   . Keloid   . Mild mitral regurgitation by prior echocardiogram   . Nonischemic cardiomyopathy ([place])   . Persistent atrial fibrillation [facility])    s/p ablation 4/16  Dr. Rayann Heman    Past Surgical History:  Procedure Laterality Date  . ATRIAL FIBRILLATION ABLATION N/A 10/11/2011   Procedure: ATRIAL FIBRILLATION ABLATION;  Surgeon: Thompson Grayer, MD;  Location: [facility] CATH LAB;  Service: Cardiovascular;  Laterality: N/A;  . FEMORAL HERNIA REPAIR     at age 11  . HERNIA REPAIR    . TEE WITHOUT CARDIOVERSION  10/10/2011   Procedure: TRANSESOPHAGEAL ECHOCARDIOGRAM (TEE);  Surgeon: Fay Records, MD;  Location: [facility] ENDOSCOPY;  Service: Cardiovascular;  Laterality: N/A;     Current Outpatient Prescriptions  Medication Sig Dispense Refill  . benazepril (LOTENSIN) 10 MG tablet Take 1 tablet (10 mg total) by mouth daily. 90 tablet 0  . fish oil-omega-3 fatty acids 1000 MG capsule Take 1 g by mouth once a  week.    . furosemide (LASIX) 40 MG tablet Take 1 tablet (40 mg total) by mouth daily as needed. Take as needed for swelling 90 tablet 0  . metoprolol tartrate (LOPRESSOR) 100 MG tablet Take 1 tablet (100 mg total) by mouth 2 (two) times daily. 180 tablet 0  . potassium chloride (MICRO-K) 10 MEQ CR capsule Take 1 capsule (10 mEq total) by mouth daily. 90 capsule 0  . rivaroxaban (XARELTO) 20 MG TABS tablet Take 1 tablet (20 mg total) by mouth daily with supper. Call for appointment before next refill. 90 tablet 0  . simvastatin (ZOCOR) 40 MG tablet Take 1 tablet (40 mg total) by mouth at bedtime. 90 tablet 0   No current facility-administered medications for this visit.     Allergies:   Patient has no known allergies.     ROS:  Please see the history of present illness.   Otherwise, review of systems are positive for none.   All other systems are reviewed and negative.    PHYSICAL EXAM: VS:  BP (!) 144/94   Pulse 88   Ht 6\' 1"  (1.854 m)   Wt 245 lb (111.1 kg)   BMI 32.32 kg/m  , BMI Body mass index is 32.32 kg/m. GENERAL:  Well  appearing NECK:  No jugular venous distention, waveform within normal limits, carotid upstroke brisk and symmetric, no bruits, no thyromegaly LYMPHATICS:  No cervical, inguinal adenopathy LUNGS:  Clear to auscultation bilaterally BACK:  No CVA tenderness CHEST:  Unremarkable HEART:  PMI not displaced or sustained,S1 and S2 within normal limits, no S3, no clicks, no rubs, no murmurs, irregular ABD:  Flat, positive bowel sounds normal in frequency in pitch, no bruits, no rebound, no guarding, no midline pulsatile mass, no hepatomegaly, no splenomegaly EXT:  2 plus pulses throughout, trace edema, no cyanosis no clubbing   EKG:  EKG is ordered today. The ekg ordered today demonstrates Atrial fibrillation, axis within normal limits, intervals within normal limits, no acute ST-T wave changes.   Recent Labs: 05/11/2016: ALT 19; BUN 12; Creatinine, Ser 0.86;  Hemoglobin 15.2; Platelets 192.0; Potassium 4.6; Sodium 138; TSH 1.27    Lipid Panel    Component Value Date/Time   CHOL 168 05/11/2016 1359   TRIG 52.0 05/11/2016 1359   HDL 36.10 (L) 05/11/2016 1359   CHOLHDL 5 05/11/2016 1359   VLDL 10.4 05/11/2016 1359   LDLCALC 122 (H) 05/11/2016 1359      Wt Readings from Last 3 Encounters:  02/20/17 245 lb (111.1 kg)  06/06/16 236 lb (107 kg)  05/11/16 237 lb 4 oz (107.6 kg)      Other studies Reviewed: Additional studies/ records that were reviewed today include: None. Review of the above records demonstrates:  Please see elsewhere in the note.     ASSESSMENT AND PLAN:    ATRIAL FIB:    [name] has a CHA2DS2 - VASc score of 2.  He is doing relatively well.  However, he is bothered at night slightly.  I would like to add PRN cardizem 30 mg for night time use if he is having increased symptoms.   He has his labs followed by his [name], MD   HTN:  This is elevated but unusual.  He will continue a BP diary.    Current medicines are reviewed at length with the patient today.  The patient does not have concerns regarding medicines.  The following changes have been made:  no change  Labs/ tests ordered today include: None No orders of the defined types were placed in this encounter.    Disposition:   FU with me in one year.     Signed, Minus Breeding, MD  02/20/2017 8:17 AM    [facility]

## 2017-02-20 ENCOUNTER — Encounter: Payer: Self-pay | Admitting: Cardiology

## 2017-02-20 ENCOUNTER — Ambulatory Visit (INDEPENDENT_AMBULATORY_CARE_PROVIDER_SITE_OTHER): Payer: Medicare HMO | Admitting: Cardiology

## 2017-02-20 VITALS — BP 144/94 | HR 88 | Ht 73.0 in | Wt 245.0 lb

## 2017-02-20 DIAGNOSIS — I10 Essential (primary) hypertension: Secondary | ICD-10-CM | POA: Diagnosis not present

## 2017-02-20 DIAGNOSIS — I4819 Other persistent atrial fibrillation: Secondary | ICD-10-CM

## 2017-02-20 DIAGNOSIS — I481 Persistent atrial fibrillation: Secondary | ICD-10-CM

## 2017-02-20 MED ORDER — DILTIAZEM HCL 30 MG PO TABS: 30.0000 mg | ORAL_TABLET | ORAL | 6 refills | Status: DC | PRN

## 2017-02-20 NOTE — Patient Instructions (Signed)
Medication Instructions:  START- Cardizem 30 mg as needed  If you need a refill on your cardiac medications before your next appointment, please call your pharmacy.  Labwork: None Ordered   Testing/Procedures: None Ordered  Follow-Up: Your physician wants you to follow-up in: 1 Year. You should receive a reminder letter in the mail two months in advance. If you do not receive a letter, please call our office [phone].    Thank you for choosing [facility] at [facility]!!

## 2017-02-21 ENCOUNTER — Encounter: Payer: Self-pay | Admitting: Cardiology

## 2017-04-06 ENCOUNTER — Telehealth: Payer: Self-pay | Admitting: Cardiology

## 2017-04-06 ENCOUNTER — Other Ambulatory Visit: Payer: Self-pay | Admitting: Cardiology

## 2017-04-06 NOTE — Telephone Encounter (Signed)
°*  STAT* If patient is at the pharmacy, call can be transferred to refill team.   1. Which medications need to be refilled? (please list name of each medication and dose if known) xarelto 20mg  2. Which pharmacy/location (including street and city if local pharmacy) is medication to be sent to? [facility] on [address]  3. Do they need a 30 day or 90 day supply?  [place]

## 2017-04-06 NOTE — Telephone Encounter (Signed)
Follow up   Medication has not been called in yet and he is completely out

## 2017-04-07 MED ORDER — RIVAROXABAN 20 MG PO TABS: 20.0000 mg | ORAL_TABLET | Freq: Every day | ORAL | 1 refills | Status: DC

## 2017-04-07 NOTE — Telephone Encounter (Signed)
Rx(s) sent to pharmacy electronically.  

## 2017-07-13 ENCOUNTER — Other Ambulatory Visit: Payer: Self-pay | Admitting: *Deleted

## 2017-07-13 MED ORDER — POTASSIUM CHLORIDE ER 10 MEQ PO CPCR: 10.0000 meq | ORAL_CAPSULE | Freq: Every day | ORAL | 2 refills | Status: DC

## 2017-07-13 MED ORDER — SIMVASTATIN 40 MG PO TABS: 40.0000 mg | ORAL_TABLET | Freq: Every day | ORAL | 2 refills | Status: DC

## 2017-07-13 MED ORDER — METOPROLOL TARTRATE 100 MG PO TABS: 100.0000 mg | ORAL_TABLET | Freq: Two times a day (BID) | ORAL | 2 refills | Status: DC

## 2017-07-13 MED ORDER — BENAZEPRIL HCL 10 MG PO TABS: 10.0000 mg | ORAL_TABLET | Freq: Every day | ORAL | 2 refills | Status: DC

## 2017-11-15 ENCOUNTER — Other Ambulatory Visit (INDEPENDENT_AMBULATORY_CARE_PROVIDER_SITE_OTHER): Payer: Medicare HMO

## 2017-11-15 ENCOUNTER — Encounter: Payer: Self-pay | Admitting: Internal Medicine

## 2017-11-15 ENCOUNTER — Ambulatory Visit (INDEPENDENT_AMBULATORY_CARE_PROVIDER_SITE_OTHER): Payer: Medicare HMO | Admitting: Internal Medicine

## 2017-11-15 VITALS — BP 110/70 | HR 83 | Temp 98.1°F | Resp 16 | Ht 73.0 in | Wt 242.2 lb

## 2017-11-15 DIAGNOSIS — Z86010 Personal history of colonic polyps: Secondary | ICD-10-CM

## 2017-11-15 DIAGNOSIS — N400 Enlarged prostate without lower urinary tract symptoms: Secondary | ICD-10-CM | POA: Diagnosis not present

## 2017-11-15 DIAGNOSIS — R7303 Prediabetes: Secondary | ICD-10-CM

## 2017-11-15 DIAGNOSIS — E118 Type 2 diabetes mellitus with unspecified complications: Secondary | ICD-10-CM | POA: Diagnosis not present

## 2017-11-15 DIAGNOSIS — E785 Hyperlipidemia, unspecified: Secondary | ICD-10-CM

## 2017-11-15 DIAGNOSIS — I481 Persistent atrial fibrillation: Secondary | ICD-10-CM | POA: Diagnosis not present

## 2017-11-15 DIAGNOSIS — Z0000 Encounter for general adult medical examination without abnormal findings: Secondary | ICD-10-CM

## 2017-11-15 DIAGNOSIS — I10 Essential (primary) hypertension: Secondary | ICD-10-CM | POA: Diagnosis not present

## 2017-11-15 DIAGNOSIS — H5340 Unspecified visual field defects: Secondary | ICD-10-CM | POA: Insufficient documentation

## 2017-11-15 DIAGNOSIS — I4819 Other persistent atrial fibrillation: Secondary | ICD-10-CM

## 2017-11-15 LAB — LIPID PANEL
CHOLESTEROL: 191 mg/dL (ref 0–200)
HDL: 34.5 mg/dL — AB (ref >39.00)
LDL Cholesterol: 144 mg/dL — ABNORMAL HIGH (ref 0–99)
NONHDL: 156.6
Total CHOL/HDL Ratio: 6
Triglycerides: 61 mg/dL (ref 0.0–149.0)
VLDL: 12.2 mg/dL (ref 0.0–40.0)

## 2017-11-15 LAB — URINALYSIS, ROUTINE W REFLEX MICROSCOPIC
BILIRUBIN URINE: NEGATIVE
KETONES UR: NEGATIVE
LEUKOCYTES UA: NEGATIVE
NITRITE: NEGATIVE
PH: 5.5 (ref 5.0–8.0)
TOTAL PROTEIN, URINE-UPE24: NEGATIVE
UROBILINOGEN UA: 0.2 (ref 0.0–1.0)
Urine Glucose: NEGATIVE

## 2017-11-15 LAB — CBC WITH DIFFERENTIAL/PLATELET
BASOS ABS: 0.1 10*3/uL (ref 0.0–0.1)
Basophils Relative: 1 % (ref 0.0–3.0)
Eosinophils Absolute: 0.1 10*3/uL (ref 0.0–0.7)
Eosinophils Relative: 1.7 % (ref 0.0–5.0)
HCT: 45.4 % (ref 39.0–52.0)
Hemoglobin: 15.6 g/dL (ref 13.0–17.0)
LYMPHS ABS: 2.3 10*3/uL (ref 0.7–4.0)
Lymphocytes Relative: 39.9 % (ref 12.0–46.0)
MCHC: 34.4 g/dL (ref 30.0–36.0)
MCV: 98.2 fl (ref 78.0–100.0)
MONO ABS: 0.5 10*3/uL (ref 0.1–1.0)
Monocytes Relative: 8.7 % (ref 3.0–12.0)
NEUTROS PCT: 48.7 % (ref 43.0–77.0)
Neutro Abs: 2.9 10*3/uL (ref 1.4–7.7)
Platelets: 172 10*3/uL (ref 150.0–400.0)
RBC: 4.63 Mil/uL (ref 4.22–5.81)
RDW: 13.9 % (ref 11.5–15.5)
WBC: 5.9 10*3/uL (ref 4.0–10.5)

## 2017-11-15 LAB — COMPREHENSIVE METABOLIC PANEL
ALK PHOS: 257 U/L — AB (ref 39–117)
ALT: 18 U/L (ref 0–53)
AST: 20 U/L (ref 0–37)
Albumin: 4.3 g/dL (ref 3.5–5.2)
BILIRUBIN TOTAL: 1.2 mg/dL (ref 0.2–1.2)
BUN: 12 mg/dL (ref 6–23)
CO2: 27 mEq/L (ref 19–32)
Calcium: 9.8 mg/dL (ref 8.4–10.5)
Chloride: 103 mEq/L (ref 96–112)
Creatinine, Ser: 0.95 mg/dL (ref 0.40–1.50)
GFR: 99.44 mL/min (ref >60.00)
GLUCOSE: 117 mg/dL — AB (ref 70–99)
Potassium: 4.8 mEq/L (ref 3.5–5.1)
SODIUM: 140 meq/L (ref 135–145)
TOTAL PROTEIN: 7.8 g/dL (ref 6.0–8.3)

## 2017-11-15 LAB — HEMOGLOBIN A1C: Hgb A1c MFr Bld: 6.5 % (ref 4.6–6.5)

## 2017-11-15 LAB — THYROID PANEL WITH TSH
Free Thyroxine Index: 1.9 (ref 1.4–3.8)
T3 Uptake: 29 % (ref 22–35)
T4, Total: 6.6 ug/dL (ref 4.9–10.5)
TSH: 1.81 mIU/L (ref 0.40–4.50)

## 2017-11-15 LAB — PSA: PSA: 0.8 ng/mL (ref 0.10–4.00)

## 2017-11-15 NOTE — Progress Notes (Signed)
Subjective:  Patient ID: Edward Pittman, male    DOB: 1940/09/22  Age: 77 y.o. MRN: [id number]  CC: Atrial Fibrillation; Hypertension; and Annual Exam   HPI Edward Pittman presents for a CPX.  About a year ago he started noticing a visual cut in his right eye over the lateral visual field.  Symptoms have not changed much over the last year.  He denies headache, paresthesias, ataxia, or eye pain.  He tells me his blood pressure has been well controlled and he denies any recent episodes of chest pain, shortness of breath, palpitations, edema, or fatigue.  Past Medical History:  Diagnosis Date  . Chronic systolic dysfunction of left ventricle   . DJD (degenerative joint disease) of hip   . Dyslipidemia   . HTN (hypertension)   . Hx of adenomatous colonic polyps   . Hx of colonoscopy   . Keloid   . Mild mitral regurgitation by prior echocardiogram   . Nonischemic cardiomyopathy ([place])   . Persistent atrial fibrillation [facility])    s/p ablation 4/16  Dr. Rayann Heman   Past Surgical History:  Procedure Laterality Date  . ATRIAL FIBRILLATION ABLATION N/A 10/11/2011   Procedure: ATRIAL FIBRILLATION ABLATION;  Surgeon: Thompson Grayer, MD;  Location: [facility] CATH LAB;  Service: Cardiovascular;  Laterality: N/A;  . FEMORAL HERNIA REPAIR     at age 45  . HERNIA REPAIR    . TEE WITHOUT CARDIOVERSION  10/10/2011   Procedure: TRANSESOPHAGEAL ECHOCARDIOGRAM (TEE);  Surgeon: Fay Records, MD;  Location: [facility] ENDOSCOPY;  Service: Cardiovascular;  Laterality: N/A;    reports that he quit smoking about 18 years ago. He has never used smokeless tobacco. He reports that he does not drink alcohol or use drugs. family history includes Brain cancer in his mother and sister; Breast cancer in his mother and sister; Colon cancer in his unknown relative; Diabetes in his sister and sister; Heart attack in his father; Lung cancer in his brother and brother; Prostate cancer in his unknown relative. No Known  Allergies  Outpatient Medications Prior to Visit  Medication Sig Dispense Refill  . benazepril (LOTENSIN) 10 MG tablet Take 1 tablet (10 mg total) by mouth daily. 90 tablet 2  . diltiazem (CARDIZEM) 30 MG tablet Take 1 tablet (30 mg total) by mouth as needed. 30 tablet 6  . fish oil-omega-3 fatty acids 1000 MG capsule Take 1 g by mouth once a week.    . furosemide (LASIX) 40 MG tablet Take 1 tablet (40 mg total) by mouth daily as needed. Take as needed for swelling 90 tablet 0  . metoprolol tartrate (LOPRESSOR) 100 MG tablet Take 1 tablet (100 mg total) by mouth 2 (two) times daily. 180 tablet 2  . potassium chloride (MICRO-K) 10 MEQ CR capsule Take 1 capsule (10 mEq total) by mouth daily. 90 capsule 2  . rivaroxaban (XARELTO) 20 MG TABS tablet Take 1 tablet (20 mg total) by mouth daily with supper. 90 tablet 1  . simvastatin (ZOCOR) 40 MG tablet Take 1 tablet (40 mg total) by mouth daily. 90 tablet 2   No facility-administered medications prior to visit.     ROS Review of Systems  Constitutional: Negative.  Negative for appetite change, diaphoresis and fatigue.  HENT: Negative.   Eyes: Positive for visual disturbance. Negative for photophobia and pain.  Respiratory: Negative.  Negative for apnea, cough, chest tightness, shortness of breath and wheezing.   Cardiovascular: Negative for chest pain, palpitations and leg swelling.  Gastrointestinal: Negative for abdominal pain, constipation, diarrhea and vomiting.  Endocrine: Negative for polydipsia, polyphagia and polyuria.  Genitourinary: Negative.  Negative for difficulty urinating, scrotal swelling, testicular pain and urgency.  Musculoskeletal: Negative.  Negative for arthralgias and myalgias.  Skin: Negative.  Negative for color change and rash.  Neurological: Negative.  Negative for dizziness, weakness, light-headedness, numbness and headaches.  Hematological: Negative for adenopathy. Does not bruise/bleed easily.   Psychiatric/Behavioral: Negative.     Objective:  BP 110/70 (BP Location: Left Arm, Patient Position: Sitting, Cuff Size: Large)   Pulse 83   Temp 98.1 F (36.7 C) (Oral)   Resp 16   Ht 6\' 1"  (1.854 m)   Wt 242 lb 4 oz (109.9 kg)   SpO2 97%   BMI 31.96 kg/m   BP Readings from Last 3 Encounters:  11/15/17 110/70  02/20/17 (!) 144/94  06/06/16 124/86    Wt Readings from Last 3 Encounters:  11/15/17 242 lb 4 oz (109.9 kg)  02/20/17 245 lb (111.1 kg)  06/06/16 236 lb (107 kg)    Physical Exam  Constitutional: He is oriented to person, place, and time. No distress.  HENT:  Mouth/Throat: Oropharynx is clear and moist. No oropharyngeal exudate.  Eyes: Conjunctivae are normal. No scleral icterus.  Neck: Normal range of motion. Neck supple. No JVD present. No thyromegaly present.  Cardiovascular: Normal rate and normal heart sounds. An irregularly irregular rhythm present. Exam reveals no gallop.  No murmur heard. Pulmonary/Chest: Effort normal and breath sounds normal. No stridor. No respiratory distress. He has no wheezes. He has no rales.  Abdominal: Soft. Bowel sounds are normal. He exhibits no mass. There is no hepatosplenomegaly. There is no tenderness. Hernia confirmed negative in the right inguinal area and confirmed negative in the left inguinal area.  Genitourinary: Rectum normal, testes normal and penis normal. Rectal exam shows no external hemorrhoid, no internal hemorrhoid, no fissure, no mass, no tenderness, anal tone normal and guaiac negative stool. Prostate is enlarged (1+ smooth symm BPH). Prostate is not tender. Right testis shows no mass, no swelling and no tenderness. Left testis shows no mass, no swelling and no tenderness. Circumcised. No penile erythema or penile tenderness. No discharge found.  Musculoskeletal: Normal range of motion. He exhibits no edema, tenderness or deformity.  Lymphadenopathy:    He has no cervical adenopathy. No inguinal adenopathy  noted on the right or left side.  Neurological: He is alert and oriented to person, place, and time.  Skin: Skin is warm and dry. He is not diaphoretic.  Vitals reviewed.   Lab Results  Component Value Date   WBC 5.9 11/15/2017   HGB 15.6 11/15/2017   HCT 45.4 11/15/2017   PLT 172.0 11/15/2017   GLUCOSE 117 (H) 11/15/2017   CHOL 191 11/15/2017   TRIG 61.0 11/15/2017   HDL 34.50 (L) 11/15/2017   LDLCALC 144 (H) 11/15/2017   ALT 18 11/15/2017   AST 20 11/15/2017   NA 140 11/15/2017   K 4.8 11/15/2017   CL 103 11/15/2017   CREATININE 0.95 11/15/2017   BUN 12 11/15/2017   CO2 27 11/15/2017   TSH 1.81 11/15/2017   PSA 0.80 11/15/2017   INR 2.8 08/03/2012   HGBA1C 6.5 11/15/2017    US Abdomen Complete  Result Date: 12/20/2013 CLINICAL DATA:  Increased LFTs EXAM: ULTRASOUND ABDOMEN COMPLETE COMPARISON:  None. FINDINGS: Gallbladder: No gallstones or wall thickening visualized. No sonographic Murphy sign noted. Common bile duct: Diameter: 4.8 mm in diameter within  normal limits. Liver: No focal lesion identified. Diffuse increased echogenicity of the liver suspicious for fatty infiltration. IVC: Not seen due to abundant bowel gas Pancreas: Not seen due to abundant bowel gas Spleen: Size and appearance within normal limits. Measures 3.4 cm in length. Right Kidney: Length: 11.7 cm. Echogenicity within normal limits. No mass or hydronephrosis visualized. Left Kidney: Length: 12.8 cm. Echogenicity within normal limits. No mass or hydronephrosis visualized. Abdominal aorta: No aneurysm visualized. Measures up to 2.7 cm in diameter. Atherosclerotic plaque are noted mid and distal aorta. Other findings: None. IMPRESSION: 1. No gallstones are noted within gallbladder.  Normal CBD. 2. Diffuse increased echogenicity of the liver suspicious for fatty infiltration. No focal hepatic mass. 3. No hydronephrosis. Electronically Signed   By: Lahoma Crocker M.D.   On: 12/20/2013 10:56    Assessment & Plan:    Abeer was seen today for atrial fibrillation, hypertension and annual exam.  Diagnoses and all orders for this visit:  Persistent atrial fibrillation ([place])- He has good rate control and is asymptomatic.  His lab work is negative for secondary causes.  Will continue anticoagulation with Xarelto. -     Thyroid Panel With TSH; Future  Essential hypertension- His blood pressure is well controlled.  Electrolytes and renal function are normal. -     CBC with Differential/Platelet; Future -     Comprehensive metabolic panel; Future -     Urinalysis, Routine w reflex microscopic; Future  Hyperlipidemia with target LDL less than 100- He has not achieved his LDL goal.  Of asked him to restart a statin for CV risk reduction. -     Comprehensive metabolic panel; Future -     Lipid panel; Future -     simvastatin (ZOCOR) 40 MG tablet; Take 1 tablet (40 mg total) by mouth daily.  Prediabetes- His A1c is up to 6.5%.  He has new onset type 2 diabetes mellitus.  Medical therapy is not indicated. -     Comprehensive metabolic panel; Future -     Hemoglobin A1c; Future  Benign prostatic hyperplasia without lower urinary tract symptoms- His PSA remains low which is reassuring that he does not have prostate cancer.  He has no symptoms that need to be treated. -     PSA; Future  History of colonic polyps -     Ambulatory referral to Gastroenterology  Visual field cut -     Ambulatory referral to Ophthalmology  Type 2 diabetes mellitus with complication, without long-term current use of insulin ([place])- As above  Routine general medical examination at a health care facility   I am having Edward Pittman maintain his fish oil-omega-3 fatty acids, furosemide, diltiazem, rivaroxaban, metoprolol tartrate, potassium chloride, benazepril, and simvastatin.  Meds ordered this encounter  Medications  . simvastatin (ZOCOR) 40 MG tablet    Sig: Take 1 tablet (40 mg total) by mouth daily.    Dispense:  90  tablet    Refill:  1   See AVS for instructions about healthy living and anticipatory guidance.  Follow-up: Return in about 6 months (around 05/18/2018).  Scarlette Calico, MD

## 2017-11-15 NOTE — Patient Instructions (Signed)

## 2017-11-16 DIAGNOSIS — E118 Type 2 diabetes mellitus with unspecified complications: Secondary | ICD-10-CM | POA: Insufficient documentation

## 2017-11-16 MED ORDER — SIMVASTATIN 40 MG PO TABS: 40.0000 mg | ORAL_TABLET | Freq: Every day | ORAL | 1 refills | Status: DC

## 2017-11-16 NOTE — Assessment & Plan Note (Signed)

## 2017-12-06 DIAGNOSIS — H401131 Primary open-angle glaucoma, bilateral, mild stage: Secondary | ICD-10-CM | POA: Diagnosis not present

## 2017-12-06 DIAGNOSIS — H35033 Hypertensive retinopathy, bilateral: Secondary | ICD-10-CM | POA: Diagnosis not present

## 2018-01-01 ENCOUNTER — Other Ambulatory Visit: Payer: Self-pay

## 2018-01-01 MED ORDER — RIVAROXABAN 20 MG PO TABS: 20.0000 mg | ORAL_TABLET | Freq: Every day | ORAL | 1 refills | Status: DC

## 2018-01-02 ENCOUNTER — Other Ambulatory Visit: Payer: Self-pay

## 2018-01-02 MED ORDER — RIVAROXABAN 20 MG PO TABS: 20.0000 mg | ORAL_TABLET | Freq: Every day | ORAL | 0 refills | Status: DC

## 2018-01-08 DIAGNOSIS — H401131 Primary open-angle glaucoma, bilateral, mild stage: Secondary | ICD-10-CM | POA: Diagnosis not present

## 2018-04-30 ENCOUNTER — Ambulatory Visit (INDEPENDENT_AMBULATORY_CARE_PROVIDER_SITE_OTHER)
Admission: RE | Admit: 2018-04-30 | Discharge: 2018-04-30 | Disposition: A | Discharge status: Home / Self Care | Payer: Medicare HMO | Source: Ambulatory Visit | Attending: Internal Medicine | Admitting: Internal Medicine

## 2018-04-30 ENCOUNTER — Ambulatory Visit (INDEPENDENT_AMBULATORY_CARE_PROVIDER_SITE_OTHER): Payer: Medicare HMO | Admitting: Internal Medicine

## 2018-04-30 ENCOUNTER — Encounter: Payer: Self-pay | Admitting: Internal Medicine

## 2018-04-30 ENCOUNTER — Other Ambulatory Visit (INDEPENDENT_AMBULATORY_CARE_PROVIDER_SITE_OTHER): Payer: Medicare HMO

## 2018-04-30 VITALS — BP 136/88 | HR 140 | Temp 97.6°F | Resp 16 | Ht 73.0 in | Wt 244.0 lb

## 2018-04-30 DIAGNOSIS — R05 Cough: Secondary | ICD-10-CM | POA: Diagnosis not present

## 2018-04-30 DIAGNOSIS — I10 Essential (primary) hypertension: Secondary | ICD-10-CM

## 2018-04-30 DIAGNOSIS — R000 Tachycardia, unspecified: Secondary | ICD-10-CM | POA: Diagnosis not present

## 2018-04-30 DIAGNOSIS — R059 Cough, unspecified: Secondary | ICD-10-CM

## 2018-04-30 DIAGNOSIS — E118 Type 2 diabetes mellitus with unspecified complications: Secondary | ICD-10-CM | POA: Diagnosis not present

## 2018-04-30 DIAGNOSIS — I4819 Other persistent atrial fibrillation: Secondary | ICD-10-CM

## 2018-04-30 DIAGNOSIS — R0602 Shortness of breath: Secondary | ICD-10-CM | POA: Diagnosis not present

## 2018-04-30 LAB — BASIC METABOLIC PANEL
BUN: 9 mg/dL (ref 6–23)
CHLORIDE: 102 meq/L (ref 96–112)
CO2: 27 meq/L (ref 19–32)
Calcium: 9.7 mg/dL (ref 8.4–10.5)
Creatinine, Ser: 0.85 mg/dL (ref 0.40–1.50)
GFR: 112.92 mL/min (ref >60.00)
GLUCOSE: 116 mg/dL — AB (ref 70–99)
POTASSIUM: 4 meq/L (ref 3.5–5.1)
Sodium: 137 mEq/L (ref 135–145)

## 2018-04-30 LAB — URINALYSIS, ROUTINE W REFLEX MICROSCOPIC
KETONES UR: NEGATIVE
LEUKOCYTES UA: NEGATIVE
Nitrite: NEGATIVE
Total Protein, Urine: 100 — AB
URINE GLUCOSE: NEGATIVE
Urobilinogen, UA: 1 (ref 0.0–1.0)
pH: 5 (ref 5.0–8.0)

## 2018-04-30 LAB — MICROALBUMIN / CREATININE URINE RATIO
Creatinine,U: 275.3 mg/dL
MICROALB/CREAT RATIO: 15.5 mg/g (ref 0.0–30.0)
Microalb, Ur: 42.7 mg/dL — ABNORMAL HIGH (ref 0.0–1.9)

## 2018-04-30 LAB — TSH: TSH: 1.59 u[IU]/mL (ref 0.35–4.50)

## 2018-04-30 LAB — HEMOGLOBIN A1C: Hgb A1c MFr Bld: 6.5 % (ref 4.6–6.5)

## 2018-04-30 MED ORDER — DILTIAZEM HCL ER COATED BEADS 240 MG PO CP24: 240.0000 mg | ORAL_CAPSULE | Freq: Every day | ORAL | 1 refills | Status: DC

## 2018-04-30 MED ORDER — RIVAROXABAN 20 MG PO TABS: 20.0000 mg | ORAL_TABLET | Freq: Every day | ORAL | 1 refills | Status: DC

## 2018-04-30 MED ORDER — DM-GUAIFENESIN ER 30-600 MG PO TB12: 1.0000 | ORAL_TABLET | Freq: Two times a day (BID) | ORAL | 1 refills | Status: DC | PRN

## 2018-04-30 NOTE — Progress Notes (Signed)
Subjective:  Patient ID: Edward Pittman, male    DOB: 10/03/1940  Age: 77 y.o. MRN: [id number]  CC: Cough and Diabetes   HPI Edward Pittman presents for f/up - He complains of a one-month history of nonproductive cough.  He has also noticed that his heart race has been higher over the last month.  He was previously told to take diltiazem as needed for an elevated heart rate but he has not been doing that over the last few weeks.  He denies chest pain, hemoptysis, dizziness, lightheadedness, or near syncope.  Outpatient Medications Prior to Visit  Medication Sig Dispense Refill  . furosemide (LASIX) 40 MG tablet Take 1 tablet (40 mg total) by mouth daily as needed. Take as needed for swelling 90 tablet 0  . metoprolol tartrate (LOPRESSOR) 100 MG tablet Take 1 tablet (100 mg total) by mouth 2 (two) times daily. 180 tablet 2  . potassium chloride (MICRO-K) 10 MEQ CR capsule Take 1 capsule (10 mEq total) by mouth daily. 90 capsule 2  . simvastatin (ZOCOR) 40 MG tablet Take 1 tablet (40 mg total) by mouth daily. 90 tablet 1  . benazepril (LOTENSIN) 10 MG tablet Take 1 tablet (10 mg total) by mouth daily. 90 tablet 2  . diltiazem (CARDIZEM) 30 MG tablet Take 1 tablet (30 mg total) by mouth as needed. 30 tablet 6  . fish oil-omega-3 fatty acids 1000 MG capsule Take 1 g by mouth once a week.    . rivaroxaban (XARELTO) 20 MG TABS tablet Take 1 tablet (20 mg total) by mouth daily with supper. 90 tablet 0   No facility-administered medications prior to visit.     ROS Review of Systems  Constitutional: Negative.  Negative for chills, diaphoresis, fatigue and fever.  HENT: Negative.   Eyes: Negative for visual disturbance.  Respiratory: Positive for cough. Negative for chest tightness, shortness of breath and wheezing.   Cardiovascular: Positive for palpitations. Negative for chest pain and leg swelling.  Endocrine: Negative.  Negative for cold intolerance and heat intolerance.    Genitourinary: Negative.  Negative for difficulty urinating.  Musculoskeletal: Negative.  Negative for arthralgias and neck pain.  Skin: Negative.   Neurological: Negative.  Negative for dizziness, weakness, light-headedness and headaches.  Hematological: Negative for adenopathy. Does not bruise/bleed easily.  Psychiatric/Behavioral: Negative.  The patient is not nervous/anxious.     Objective:  BP 136/88   Pulse (!) 140   Temp 97.6 F (36.4 C) (Oral)   Resp 16   Ht 6\' 1"  (1.854 m)   Wt 244 lb (110.7 kg)   SpO2 97%   BMI 32.19 kg/m   BP Readings from Last 3 Encounters:  04/30/18 136/88  11/15/17 110/70  02/20/17 (!) 144/94    Wt Readings from Last 3 Encounters:  04/30/18 244 lb (110.7 kg)  11/15/17 242 lb 4 oz (109.9 kg)  02/20/17 245 lb (111.1 kg)    Physical Exam  Constitutional: He is oriented to person, place, and time. No distress.  HENT:  Mouth/Throat: Oropharynx is clear and moist. No oropharyngeal exudate.  Eyes: Conjunctivae are normal. No scleral icterus.  Neck: Normal range of motion. Neck supple. No JVD present. No thyromegaly present.  Cardiovascular: Normal heart sounds. An irregularly irregular rhythm present. Tachycardia present. Exam reveals no gallop.  No murmur heard. EKG ----  Rate = 144 Possible atrial fibrillation  ABNORMAL RHYTHM   Pulmonary/Chest: Effort normal and breath sounds normal. No stridor. No tachypnea. No respiratory distress.  He has no decreased breath sounds. He has no wheezes. He has no rhonchi. He has no rales.  Abdominal: Soft. Bowel sounds are normal. He exhibits no mass. There is no hepatosplenomegaly. There is no tenderness.  Musculoskeletal: Normal range of motion. He exhibits no edema, tenderness or deformity.  Lymphadenopathy:    He has no cervical adenopathy.  Neurological: He is alert and oriented to person, place, and time.  Skin: Skin is warm and dry. No rash noted. He is not diaphoretic.  Psychiatric: He has a  normal mood and affect. His behavior is normal. Judgment and thought content normal.  Vitals reviewed.   Lab Results  Component Value Date   WBC 5.9 11/15/2017   HGB 15.6 11/15/2017   HCT 45.4 11/15/2017   PLT 172.0 11/15/2017   GLUCOSE 116 (H) 04/30/2018   CHOL 191 11/15/2017   TRIG 61.0 11/15/2017   HDL 34.50 (L) 11/15/2017   LDLCALC 144 (H) 11/15/2017   ALT 18 11/15/2017   AST 20 11/15/2017   NA 137 04/30/2018   K 4.0 04/30/2018   CL 102 04/30/2018   CREATININE 0.85 04/30/2018   BUN 9 04/30/2018   CO2 27 04/30/2018   TSH 1.59 04/30/2018   PSA 0.80 11/15/2017   INR 2.8 08/03/2012   HGBA1C 6.5 04/30/2018   MICROALBUR 42.7 (H) 04/30/2018    US Abdomen Complete  Result Date: 12/20/2013 CLINICAL DATA:  Increased LFTs EXAM: ULTRASOUND ABDOMEN COMPLETE COMPARISON:  None. FINDINGS: Gallbladder: No gallstones or wall thickening visualized. No sonographic Murphy sign noted. Common bile duct: Diameter: 4.8 mm in diameter within normal limits. Liver: No focal lesion identified. Diffuse increased echogenicity of the liver suspicious for fatty infiltration. IVC: Not seen due to abundant bowel gas Pancreas: Not seen due to abundant bowel gas Spleen: Size and appearance within normal limits. Measures 3.4 cm in length. Right Kidney: Length: 11.7 cm. Echogenicity within normal limits. No mass or hydronephrosis visualized. Left Kidney: Length: 12.8 cm. Echogenicity within normal limits. No mass or hydronephrosis visualized. Abdominal aorta: No aneurysm visualized. Measures up to 2.7 cm in diameter. Atherosclerotic plaque are noted mid and distal aorta. Other findings: None. IMPRESSION: 1. No gallstones are noted within gallbladder.  Normal CBD. 2. Diffuse increased echogenicity of the liver suspicious for fatty infiltration. No focal hepatic mass. 3. No hydronephrosis. Electronically Signed   By: Lahoma Crocker M.D.   On: 12/20/2013 10:56    Assessment & Plan:   Edward Pittman was seen today for cough and  diabetes.  Diagnoses and all orders for this visit:  Type 2 diabetes mellitus with complication, without long-term current use of insulin (Edward Pittman)- His blood sugars are adequately well controlled. -     Basic metabolic panel; Future -     Hemoglobin A1c; Future -     Microalbumin / creatinine urine ratio; Future  Essential hypertension- His blood pressure is adequately well controlled.  He will stop taking the ACE due to the cough. -     Basic metabolic panel; Future -     Urinalysis, Routine w reflex microscopic; Future  Cough- His chest x-ray is negative for mass or infiltrate.  I think the cough is caused by the ACE inhibitor so I have asked him to stop taking it. -     DG Chest 2 View; Future -     dextromethorphan-guaiFENesin (MUCINEX DM) 30-600 MG 12hr tablet; Take 1 tablet by mouth 2 (two) times daily as needed for cough.  Tachycardia- see below -  EKG 12-Lead -     diltiazem (CARTIA XT) 240 MG 24 hr capsule; Take 1 capsule (240 mg total) by mouth daily. -     TSH; Future -     Ambulatory referral to Cardiology  Persistent atrial fibrillation- He is in A. fib with RVR.  He is tolerating it well.  I have asked him to continue the current dose of the beta-blocker but will also restart diltiazem. -     diltiazem (CARTIA XT) 240 MG 24 hr capsule; Take 1 capsule (240 mg total) by mouth daily. -     TSH; Future -     Ambulatory referral to Cardiology -     rivaroxaban (XARELTO) 20 MG TABS tablet; Take 1 tablet (20 mg total) by mouth daily with supper.   I have discontinued Ladislao H. Blake's fish oil-omega-3 fatty acids, diltiazem, and benazepril. I am also having him start on diltiazem and dextromethorphan-guaiFENesin. Additionally, I am having him maintain his furosemide, metoprolol tartrate, potassium chloride, simvastatin, and rivaroxaban.  Meds ordered this encounter  Medications  . diltiazem (CARTIA XT) 240 MG 24 hr capsule    Sig: Take 1 capsule (240 mg total) by mouth  daily.    Dispense:  90 capsule    Refill:  1  . rivaroxaban (XARELTO) 20 MG TABS tablet    Sig: Take 1 tablet (20 mg total) by mouth daily with supper.    Dispense:  90 tablet    Refill:  1  . dextromethorphan-guaiFENesin (MUCINEX DM) 30-600 MG 12hr tablet    Sig: Take 1 tablet by mouth 2 (two) times daily as needed for cough.    Dispense:  60 tablet    Refill:  1     Follow-up: Return in about 3 months (around 07/31/2018).  Scarlette Calico, MD

## 2018-04-30 NOTE — Patient Instructions (Signed)
Atrial Fibrillation Atrial fibrillation is a type of irregular or rapid heartbeat (arrhythmia). In atrial fibrillation, the heart quivers continuously in a chaotic pattern. This occurs when parts of the heart receive disorganized signals that make the heart unable to pump blood normally. This can increase the risk for stroke, heart failure, and other heart-related conditions. There are different types of atrial fibrillation, including:  Paroxysmal atrial fibrillation. This type starts suddenly, and it usually stops on its own shortly after it starts.  Persistent atrial fibrillation. This type often lasts longer than a week. It may stop on its own or with treatment.  Long-lasting persistent atrial fibrillation. This type lasts longer than 12 months.  Permanent atrial fibrillation. This type does not go away.  Talk with your health care provider to learn about the type of atrial fibrillation that you have. What are the causes? This condition is caused by some heart-related conditions or procedures, including:  A heart attack.  Coronary artery disease.  Heart failure.  Heart valve conditions.  High blood pressure.  Inflammation of the sac that surrounds the heart (pericarditis).  Heart surgery.  Certain heart rhythm disorders, such as Wolf-Parkinson-White syndrome.  Other causes include:  Pneumonia.  Obstructive sleep apnea.  Blockage of an artery in the lungs (pulmonary embolism, or PE).  Lung cancer.  Chronic lung disease.  Thyroid problems, especially if the thyroid is overactive (hyperthyroidism).  Caffeine.  Excessive alcohol use or illegal drug use.  Use of some medicines, including certain decongestants and diet pills.  Sometimes, the cause cannot be found. What increases the risk? This condition is more likely to develop in:  People who are older in age.  People who smoke.  People who have diabetes mellitus.  People who are overweight  (obese).  Athletes who exercise vigorously.  What are the signs or symptoms? Symptoms of this condition include:  A feeling that your heart is beating rapidly or irregularly.  A feeling of discomfort or pain in your chest.  Shortness of breath.  Sudden light-headedness or weakness.  Getting tired easily during exercise.  In some cases, there are no symptoms. How is this diagnosed? Your health care provider may be able to detect atrial fibrillation when taking your pulse. If detected, this condition may be diagnosed with:  An electrocardiogram (ECG).  A Holter monitor test that records your heartbeat patterns over a 24-hour period.  Transthoracic echocardiogram (TTE) to evaluate how blood flows through your heart.  Transesophageal echocardiogram (TEE) to view more detailed images of your heart.  A stress test.  Imaging tests, such as a CT scan or chest X-ray.  Blood tests.  How is this treated? The main goals of treatment are to prevent blood clots from forming and to keep your heart beating at a normal rate and rhythm. The type of treatment that you receive depends on many factors, such as your underlying medical conditions and how you feel when you are experiencing atrial fibrillation. This condition may be treated with:  Medicine to slow down the heart rate, bring the heart's rhythm back to normal, or prevent clots from forming.  Electrical cardioversion. This is a procedure that resets your heart's rhythm by delivering a controlled, low-energy shock to the heart through your skin.  Different types of ablation, such as catheter ablation, catheter ablation with pacemaker, or surgical ablation. These procedures destroy the heart tissues that send abnormal signals. When the pacemaker is used, it is placed under your skin to help your heart beat in   a regular rhythm.  Follow these instructions at home:  Take over-the counter and prescription medicines only as told by your  health care provider.  If your health care provider prescribed a blood-thinning medicine (anticoagulant), take it exactly as told. Taking too much blood-thinning medicine can cause bleeding. If you do not take enough blood-thinning medicine, you will not have the protection that you need against stroke and other problems.  Do not use tobacco products, including cigarettes, chewing tobacco, and e-cigarettes. If you need help quitting, ask your health care provider.  If you have obstructive sleep apnea, manage your condition as told by your health care provider.  Do not drink alcohol.  Do not drink beverages that contain caffeine, such as coffee, soda, and tea.  Maintain a healthy weight. Do not use diet pills unless your health care provider approves. Diet pills may make heart problems worse.  Follow diet instructions as told by your health care provider.  Exercise regularly as told by your health care provider.  Keep all follow-up visits as told by your health care provider. This is important. How is this prevented?  Avoid drinking beverages that contain caffeine or alcohol.  Avoid certain medicines, especially medicines that are used for breathing problems.  Avoid certain herbs and herbal medicines, such as those that contain ephedra or ginseng.  Do not use illegal drugs, such as cocaine and amphetamines.  Do not smoke.  Manage your high blood pressure. Contact a health care provider if:  You notice a change in the rate, rhythm, or strength of your heartbeat.  You are taking an anticoagulant and you notice increased bruising.  You tire more easily when you exercise or exert yourself. Get help right away if:  You have chest pain, abdominal pain, sweating, or weakness.  You feel nauseous.  You notice blood in your vomit, bowel movement, or urine.  You have shortness of breath.  You suddenly have swollen feet and ankles.  You feel dizzy.  You have sudden weakness or  numbness of the face, arm, or leg, especially on one side of the body.  You have trouble speaking, trouble understanding, or both (aphasia).  Your face or your eyelid droops on one side. These symptoms may represent a serious problem that is an emergency. Do not wait to see if the symptoms will go away. Get medical help right away. Call your local emergency services (911 in the [place] Do not drive yourself to the hospital. This information is not intended to replace advice given to you by your health care provider. Make sure you discuss any questions you have with your health care provider. Document Released: 06/13/2005 Document Revised: 10/21/2015 Document Reviewed: 10/08/2014 Elsevier Interactive Patient Education  2018 [employer]vier Inc.  

## 2018-05-17 ENCOUNTER — Other Ambulatory Visit: Payer: Self-pay | Admitting: Internal Medicine

## 2018-05-17 DIAGNOSIS — E785 Hyperlipidemia, unspecified: Secondary | ICD-10-CM

## 2018-05-28 NOTE — Progress Notes (Signed)
Cardiology Office Note   Date:  05/30/2018   ID:  Edward Pittman, DOB July 24, 1940, MRN [id number]  PCP:  Edward Lima, MD  Cardiologist:   Minus Breeding, MD    Chief Complaint  Patient presents with  . Atrial Fibrillation      History of Present Illness: Edward Pittman is a 77 y.o. male who presents for follow up of atrial fib.   He had atrial fib and had ablation but had recurrent fib.    Since I last saw him he has done OK.  I do notice that in early November when he went to see his primary provider his heart rate on EKG was 140s.  He said he was having a lot of coughing and problems at that time.  Is also not clear to be what medicines he was taking.  Today his heart rate is better controlled although his blood pressure is not.  He says he feels better.  He said he thought his heart rate was elevated at that time but has not really noticed his heart beating at all since then.  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  The patient denies any new symptoms such as chest discomfort, neck or arm discomfort. There has been no new shortness of breath, PND or orthopnea. There have been no reported palpitations, presyncope or syncope.      Past Medical History:  Diagnosis Date  . Chronic systolic dysfunction of left ventricle   . DJD (degenerative joint disease) of hip   . Dyslipidemia   . HTN (hypertension)   . Hx of adenomatous colonic polyps   . Hx of colonoscopy   . Keloid   . Mild mitral regurgitation by prior echocardiogram   . Nonischemic cardiomyopathy ([place])   . Persistent atrial fibrillation    s/p ablation 4/16  Dr. Rayann Heman    Past Surgical History:  Procedure Laterality Date  . ATRIAL FIBRILLATION ABLATION N/A 10/11/2011   Procedure: ATRIAL FIBRILLATION ABLATION;  Surgeon: Thompson Grayer, MD;  Location: [facility] CATH LAB;  Service: Cardiovascular;  Laterality: N/A;  . FEMORAL HERNIA REPAIR     at age 69  . HERNIA REPAIR    . TEE WITHOUT CARDIOVERSION   10/10/2011   Procedure: TRANSESOPHAGEAL ECHOCARDIOGRAM (TEE);  Surgeon: Fay Records, MD;  Location: [facility] ENDOSCOPY;  Service: Cardiovascular;  Laterality: N/A;     Current Outpatient Medications  Medication Sig Dispense Refill  . diltiazem (CARTIA XT) 240 MG 24 hr capsule Take 1 capsule (240 mg total) by mouth daily. 90 capsule 1  . rivaroxaban (XARELTO) 20 MG TABS tablet Take 1 tablet (20 mg total) by mouth daily with supper. 90 tablet 1  . metoprolol tartrate (LOPRESSOR) 100 MG tablet Take 1 tablet (100 mg total) by mouth 2 (two) times daily. 180 tablet 3  . simvastatin (ZOCOR) 40 MG tablet Take 1 tablet (40 mg total) by mouth daily at 6 PM. 90 tablet 3   No current facility-administered medications for this visit.     Allergies:   Benazepril     ROS:  Please see the history of present illness.   Otherwise, review of systems are positive for none.   All other systems are reviewed and negative.    PHYSICAL EXAM: VS:  BP (!) 158/108   Pulse 91   Ht 6\' 1"  (1.854 m)   Wt 242 lb (109.8 kg)   BMI 31.93 kg/m  , BMI Body mass  index is 31.93 kg/m. GENERAL:  Well appearing NECK:  No jugular venous distention, waveform within normal limits, carotid upstroke brisk and symmetric, no bruits, no thyromegaly LUNGS:  Clear to auscultation bilaterally CHEST:  Unremarkable HEART:  PMI not displaced or sustained,S1 and S2 within normal limits, no S3, no clicks, no rubs, no murmurs, irregular ABD:  Flat, positive bowel sounds normal in frequency in pitch, no bruits, no rebound, no guarding, no midline pulsatile mass, no hepatomegaly, no splenomegaly EXT:  2 plus pulses throughout, no edema, no cyanosis no clubbing   EKG:  EKG is  ordered today. The ekg ordered 91 demonstrates atrial fibrillation, axis within normal limits, intervals within normal limits, no acute ST-T wave changes,  Irregular   Recent Labs: 11/15/2017: ALT 18; Hemoglobin 15.6; Platelets 172.0 04/30/2018: BUN 9; Creatinine, Ser  0.85; Potassium 4.0; Sodium 137; TSH 1.59    Lipid Panel    Component Value Date/Time   CHOL 191 11/15/2017 1018   TRIG 61.0 11/15/2017 1018   HDL 34.50 (L) 11/15/2017 1018   CHOLHDL 6 11/15/2017 1018   VLDL 12.2 11/15/2017 1018   LDLCALC 144 (H) 11/15/2017 1018      Wt Readings from Last 3 Encounters:  05/30/18 242 lb (109.8 kg)  04/30/18 244 lb (110.7 kg)  11/15/17 242 lb 4 oz (109.9 kg)      Other studies Reviewed: Additional studies/ records that were reviewed today include: Office note from Edward Lima, MD. Review of the above records demonstrates:  See elsewhere   ASSESSMENT AND PLAN:    ATRIAL FIB:    [name] has a CHA2DS2 - VASc score of 2.   The patient's rate has not been well controlled last month but is better today.  It turns out he is not taking his beta-blocker and so I renewed this.  He is tolerating his blood thinner and his Cardizem and he will continue on this.   HTN: His blood pressure is not controlled but he is again not been on his beta-blocker and we renewed this.  Of note he is not been taking his Lasix or potassium I think is fine so we will leave him off of these medications.  DYSLIPIDEMIA: His LDL is not at target at 144 in May.  I am not entirely clear whether he has been taking his medications.  He was not taking his simvastatin recently as he thought that he was told to stop it by Dr. Jenny Reichmann.  However, I reviewed those notes and I do not see that this medicine was stopped so I restarted.     Current medicines are reviewed at length with the patient today.  The patient does not have concerns regarding medicines.  The following changes have been made:  As above  Labs/ tests ordered today include: None  Orders Placed This Encounter  Procedures  . EKG 12-Lead     Disposition:   FU with me in one year.     Signed, Minus Breeding, MD  05/30/2018 12:43 PM    [facility]

## 2018-05-30 ENCOUNTER — Ambulatory Visit: Payer: Medicare HMO | Admitting: Cardiology

## 2018-05-30 ENCOUNTER — Encounter: Payer: Self-pay | Admitting: Cardiology

## 2018-05-30 VITALS — BP 158/108 | HR 91 | Ht 73.0 in | Wt 242.0 lb

## 2018-05-30 DIAGNOSIS — I10 Essential (primary) hypertension: Secondary | ICD-10-CM

## 2018-05-30 DIAGNOSIS — E785 Hyperlipidemia, unspecified: Secondary | ICD-10-CM | POA: Diagnosis not present

## 2018-05-30 DIAGNOSIS — I482 Chronic atrial fibrillation, unspecified: Secondary | ICD-10-CM | POA: Diagnosis not present

## 2018-05-30 MED ORDER — SIMVASTATIN 40 MG PO TABS: 40.0000 mg | ORAL_TABLET | Freq: Every day | ORAL | 3 refills | Status: DC

## 2018-05-30 MED ORDER — METOPROLOL TARTRATE 100 MG PO TABS: 100.0000 mg | ORAL_TABLET | Freq: Two times a day (BID) | ORAL | 3 refills | Status: DC

## 2018-05-30 NOTE — Patient Instructions (Signed)
Medication Instructions:  RESTART- Simvastatin 40 mg daily and Metoprolol 100 mg twice a day  If you need a refill on your cardiac medications before your next appointment, please call your pharmacy.  Labwork: None Ordered  If you have labs (blood work) drawn today and your tests are completely normal, you will receive your results only by: Marland Kitchen MyChart Message (if you have MyChart) OR . A paper copy in the mail If you have any lab test that is abnormal or we need to change your treatment, we will call you to review the results.  Testing/Procedures: None Ordered  Follow-Up: You will need a follow up appointment in 1 Year.  Please call our office 2 months in advance([phone]) to schedule the (1 Year) appointment.  You may see  DR Percival Spanish, or one of the following Advanced Practice Providers on your designated Care Team:    . Jory Sims, DNP, ANP . Rhonda Barrett, PA-C  . Kerin Ransom, [place]  . Almyra Deforest, PA-C . Fabian Sharp, PA-C  At [facility], you and your health needs are our priority.  As part of our continuing mission to provide you with exceptional heart care, we have created designated Provider Care Teams.  These Care Teams include your primary Cardiologist (physician) and Advanced Practice Providers (APPs -  Physician Assistants and Nurse Practitioners) who all work together to provide you with the care you need, when you need it.   Thank you for choosing [facility] at [facility]!!

## 2018-06-14 ENCOUNTER — Ambulatory Visit: Payer: Medicare HMO | Admitting: Cardiology

## 2018-07-09 ENCOUNTER — Other Ambulatory Visit: Payer: Self-pay | Admitting: Cardiology

## 2018-07-09 DIAGNOSIS — I4819 Other persistent atrial fibrillation: Secondary | ICD-10-CM

## 2018-08-22 DIAGNOSIS — H401132 Primary open-angle glaucoma, bilateral, moderate stage: Secondary | ICD-10-CM | POA: Diagnosis not present

## 2018-09-17 ENCOUNTER — Other Ambulatory Visit: Payer: Self-pay

## 2018-09-17 DIAGNOSIS — I4819 Other persistent atrial fibrillation: Secondary | ICD-10-CM

## 2018-09-17 MED ORDER — RIVAROXABAN 20 MG PO TABS: 20.0000 mg | ORAL_TABLET | Freq: Every day | ORAL | 3 refills | Status: DC

## 2018-09-17 NOTE — Telephone Encounter (Signed)
Pt is a 78 yr old male who saw Dr Percival Spanish on 05/30/18, weight at that visit was 109.8Kg. Last noted SCr was 0.85 on 04/30/18. CrCl is 139mL/min. Will refill Xarelto 20mg  QD.

## 2018-10-29 ENCOUNTER — Other Ambulatory Visit: Payer: Self-pay | Admitting: Internal Medicine

## 2018-10-29 DIAGNOSIS — I4819 Other persistent atrial fibrillation: Secondary | ICD-10-CM

## 2018-10-29 DIAGNOSIS — R000 Tachycardia, unspecified: Secondary | ICD-10-CM

## 2018-11-19 ENCOUNTER — Other Ambulatory Visit: Payer: Self-pay | Admitting: Internal Medicine

## 2018-11-19 DIAGNOSIS — E785 Hyperlipidemia, unspecified: Secondary | ICD-10-CM

## 2018-11-23 ENCOUNTER — Ambulatory Visit (INDEPENDENT_AMBULATORY_CARE_PROVIDER_SITE_OTHER): Payer: 59 | Admitting: *Deleted

## 2018-11-23 DIAGNOSIS — Z0000 Encounter for general adult medical examination without abnormal findings: Secondary | ICD-10-CM

## 2018-11-23 NOTE — Patient Instructions (Signed)
Continue doing brain stimulating activities (puzzles, reading, adult coloring books, staying active) to keep memory sharp.   Continue to eat heart healthy diet (full of fruits, vegetables, whole grains, lean protein, water--limit salt, fat, and sugar intake) and increase physical activity as tolerated.   Edward Pittman , Thank you for taking time to come for your Medicare Wellness Visit. I appreciate your ongoing commitment to your health goals. Please review the following plan we discussed and let me know if I can assist you in the future.   These are the goals we discussed: Goals    . Patient Stated     I want to take down the workout equipment from the garage and start to work-out routinely.        This is a list of the screening recommended for you and due dates:  Health Maintenance  Topic Date Due  . Eye exam for diabetics  01/18/1951  . Hemoglobin A1C  10/29/2018  . Complete foot exam   11/16/2018  . Flu Shot  01/26/2019  . Colon Cancer Screening  02/25/2019  . Urine Protein Check  05/01/2019  . Tetanus Vaccine  12/13/2023  . Pneumonia vaccines  Completed    Preventive Care 42 Years and Older, Male Preventive care refers to lifestyle choices and visits with your health care provider that can promote health and wellness. What does preventive care include?   A yearly physical exam. This is also called an annual well check.  Dental exams once or twice a year.  Routine eye exams. Ask your health care provider how often you should have your eyes checked.  Personal lifestyle choices, including: ? Daily care of your teeth and gums. ? Regular physical activity. ? Eating a healthy diet. ? Avoiding tobacco and drug use. ? Limiting alcohol use. ? Practicing safe sex. ? Taking low doses of aspirin every day. ? Taking vitamin and mineral supplements as recommended by your health care provider. What happens during an annual well check? The services and screenings done by your  health care provider during your annual well check will depend on your age, overall health, lifestyle risk factors, and family history of disease. Counseling Your health care provider may ask you questions about your:  Alcohol use.  Tobacco use.  Drug use.  Emotional well-being.  Home and relationship well-being.  Sexual activity.  Eating habits.  History of falls.  Memory and ability to understand (cognition).  Work and work Statistician. Screening You may have the following tests or measurements:  Height, weight, and BMI.  Blood pressure.  Lipid and cholesterol levels. These may be checked every 5 years, or more frequently if you are over 67 years old.  Skin check.  Lung cancer screening. You may have this screening every year starting at age 21 if you have a 30-pack-year history of smoking and currently smoke or have quit within the past 15 years.  Colorectal cancer screening. All adults should have this screening starting at age 38 and continuing until age 66. You will have tests every 1-10 years, depending on your results and the type of screening test. People at increased risk should start screening at an earlier age. Screening tests may include: ? Guaiac-based fecal occult blood testing. ? Fecal immunochemical test (FIT). ? Stool DNA test. ? Virtual colonoscopy. ? Sigmoidoscopy. During this test, a flexible tube with a tiny camera (sigmoidoscope) is used to examine your rectum and lower colon. The sigmoidoscope is inserted through your anus into your rectum  and lower colon. ? Colonoscopy. During this test, a long, thin, flexible tube with a tiny camera (colonoscope) is used to examine your entire colon and rectum.  Prostate cancer screening. Recommendations will vary depending on your family history and other risks.  Hepatitis C blood test.  Hepatitis B blood test.  Sexually transmitted disease (STD) testing.  Diabetes screening. This is done by checking your  blood sugar (glucose) after you have not eaten for a while (fasting). You may have this done every 1-3 years.  Abdominal aortic aneurysm (AAA) screening. You may need this if you are a current or former smoker.  Osteoporosis. You may be screened starting at age 71 if you are at high risk. Talk with your health care provider about your test results, treatment options, and if necessary, the need for more tests. Vaccines Your health care provider may recommend certain vaccines, such as:  Influenza vaccine. This is recommended every year.  Tetanus, diphtheria, and acellular pertussis (Tdap, Td) vaccine. You may need a Td booster every 10 years.  Varicella vaccine. You may need this if you have not been vaccinated.  Zoster vaccine. You may need this after age 35.  Measles, mumps, and rubella (MMR) vaccine. You may need at least one dose of MMR if you were born in 1957 or later. You may also need a second dose.  Pneumococcal 13-valent conjugate (PCV13) vaccine. One dose is recommended after age 33.  Pneumococcal polysaccharide (PPSV23) vaccine. One dose is recommended after age 100.  Meningococcal vaccine. You may need this if you have certain conditions.  Hepatitis A vaccine. You may need this if you have certain conditions or if you travel or work in places where you may be exposed to hepatitis A.  Hepatitis B vaccine. You may need this if you have certain conditions or if you travel or work in places where you may be exposed to hepatitis B.  Haemophilus influenzae type b (Hib) vaccine. You may need this if you have certain risk factors. Talk to your health care provider about which screenings and vaccines you need and how often you need them. This information is not intended to replace advice given to you by your health care provider. Make sure you discuss any questions you have with your health care provider. Document Released: 07/10/2015 Document Revised: 08/03/2017 Document Reviewed:  04/14/2015 Elsevier Interactive Patient Education  2019 [employer].

## 2018-11-23 NOTE — Progress Notes (Addendum)
Subjective:   Margaret Staggs Leyh is a 78 y.o. male who presents for Medicare Annual/Subsequent preventive examination. I connected with patient by a telephone and verified that I am speaking with the correct person using two identifiers. Patient stated full name and DOB. Patient gave permission to continue with telephonic visit. Patient's location was at home and Nurse's location was at [place] office.   Review of Systems:  No ROS.  Medicare Wellness Virtual Visit.  Visual/audio telehealth visit, UTA vital signs.   See social history for additional risk factors.  Cardiac Risk Factors include: male gender;diabetes mellitus;dyslipidemia Sleep patterns: feels rested on waking, gets up 3 times nightly to void and sleeps 8 hours nightly.    Home Safety/Smoke Alarms: Feels safe in home. Smoke alarms in place.  Living environment; residence and Firearm Safety: 1-story house/ trailer. Lives with wife, no needs for DME, good support system Seat Belt Safety/Bike Helmet: Wears seat belt.    PSA-  Lab Results  Component Value Date   PSA 0.80 11/15/2017   PSA 0.77 05/11/2016   PSA 0.81 12/12/2013       Objective:    Vitals: There were no vitals taken for this visit.  There is no height or weight on file to calculate BMI.  Advanced Directives 11/23/2018 05/15/2016 10/11/2011 10/11/2011 10/10/2011  Does Patient Have a Medical Advance Directive? No Yes Patient does not have advance directive;Patient would not like information Patient does not have advance directive Patient does not have advance directive  Type of Advance Directive - Living will;Healthcare Power of Attorney - - -  Does patient want to make changes to medical advance directive? - No - Patient declined - - -  Copy of [place] in Chart? - No - copy requested - - -  Would patient like information on creating a medical advance directive? No - Patient declined - - - -  Pre-existing out of facility DNR order (yellow  form or pink MOST form) - - - No -    Tobacco Social History   Tobacco Use  Smoking Status Former Smoker  . Last attempt to quit: 08/09/1999  . Years since quitting: 19.3  Smokeless Tobacco Never Used  Tobacco Comment   quit 9 years ago     Counseling given: Not Answered Comment: quit 9 years ago  Past Medical History:  Diagnosis Date  . Chronic systolic dysfunction of left ventricle   . DJD (degenerative joint disease) of hip   . Dyslipidemia   . HTN (hypertension)   . Hx of adenomatous colonic polyps   . Hx of colonoscopy   . Keloid   . Mild mitral regurgitation by prior echocardiogram   . Nonischemic cardiomyopathy ([place])   . Persistent atrial fibrillation    s/p ablation 4/16  Dr. Rayann Heman   Past Surgical History:  Procedure Laterality Date  . ATRIAL FIBRILLATION ABLATION N/A 10/11/2011   Procedure: ATRIAL FIBRILLATION ABLATION;  Surgeon: Thompson Grayer, MD;  Location: [facility] CATH LAB;  Service: Cardiovascular;  Laterality: N/A;  . FEMORAL HERNIA REPAIR     at age 55  . HERNIA REPAIR    . TEE WITHOUT CARDIOVERSION  10/10/2011   Procedure: TRANSESOPHAGEAL ECHOCARDIOGRAM (TEE);  Surgeon: Fay Records, MD;  Location: [facility] ENDOSCOPY;  Service: Cardiovascular;  Laterality: N/A;   Family History  Problem Relation Age of Onset  . Brain cancer Mother   . Breast cancer Mother   . Diabetes Sister   . Breast cancer Sister   .  Brain cancer Sister   . Heart attack Father   . Lung cancer Brother   . Lung cancer Brother   . Colon cancer Other        uncle  . Prostate cancer Other        uncle  . Diabetes Sister   . Heart disease Neg Hx    Social History   Socioeconomic History  . Marital status: Married    Spouse name: Not on file  . Number of children: 4  . Years of education: Not on file  . Highest education level: Not on file  Occupational History  . Occupation: Engineer, agricultural  Social Needs  . Financial resource strain: Not hard at all  . Food insecurity:    Worry:  Never true    Inability: Never true  . Transportation needs:    Medical: No    Non-medical: No  Tobacco Use  . Smoking status: Former Smoker    Last attempt to quit: 08/09/1999    Years since quitting: 19.3  . Smokeless tobacco: Never Used  . Tobacco comment: quit 9 years ago  Substance and Sexual Activity  . Alcohol use: No  . Drug use: No  . Sexual activity: Not Currently  Lifestyle  . Physical activity:    Days per week: 3 days    Minutes per session: 40 min  . Stress: Not at all  Relationships  . Social connections:    Talks on phone: More than three times a week    Gets together: More than three times a week    Attends religious service: More than 4 times per year    Active member of club or organization: Yes    Attends meetings of clubs or organizations: More than 4 times per year    Relationship status: Married  Other Topics Concern  . Not on file  Social History Narrative  . Not on file    Outpatient Encounter Medications as of 11/23/2018  Medication Sig  . diltiazem (CARDIZEM CD) 240 MG 24 hr capsule TAKE 1 CAPSULE(240 MG) BY MOUTH DAILY  . latanoprost (XALATAN) 0.005 % ophthalmic solution INT 1 GTT IN OU QD IN THE EVE  . metoprolol tartrate (LOPRESSOR) 100 MG tablet Take 1 tablet (100 mg total) by mouth 2 (two) times daily.  . rivaroxaban (XARELTO) 20 MG TABS tablet Take 1 tablet (20 mg total) by mouth daily with supper.  . simvastatin (ZOCOR) 40 MG tablet Take 1 tablet (40 mg total) by mouth daily at 6 PM.   No facility-administered encounter medications on file as of 11/23/2018.     Activities of Daily Living In your present state of health, do you have any difficulty performing the following activities: 11/23/2018  Hearing? N  Vision? N  Difficulty concentrating or making decisions? N  Walking or climbing stairs? N  Dressing or bathing? N  Doing errands, shopping? N  Preparing Food and eating ? N  Using the Toilet? N  In the past six months, have you  accidently leaked urine? N  Do you have problems with loss of bowel control? N  Managing your Medications? N  Managing your Finances? N  Housekeeping or managing your Housekeeping? N  Some recent data might be hidden    Patient Care Team: [name], MD as PCP - General (Internal Medicine)   Assessment:   This is a routine wellness examination for [place]. Physical assessment deferred to PCP.  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 Encouraged patient to maintain daily water and healthy fluid intake. Relevant patient education assigned to patient using Emmi.  Goals    . Patient Stated     I want to take down the workout equipment from the garage and start to work-out routinely.        Fall Risk Fall Risk  11/23/2018 11/16/2017 05/15/2016 12/13/2013  Falls in the past year? 0 No No No  Number falls in past yr: 0 - - -    Depression Screen PHQ 2/9 Scores 11/23/2018 11/16/2017 05/15/2016 12/13/2013  PHQ - 2 Score 0 0 0 0    Cognitive Function        Immunization History  Administered Date(s) Administered  . Pneumococcal Conjugate-13 12/12/2013  . Pneumococcal Polysaccharide-23 05/11/2016  . Tdap 12/12/2013   Screening Tests Health Maintenance  Topic Date Due  . OPHTHALMOLOGY EXAM  01/18/1951  . HEMOGLOBIN A1C  10/29/2018  . FOOT EXAM  11/16/2018  . INFLUENZA VACCINE  01/26/2019  . COLONOSCOPY  02/25/2019  . URINE MICROALBUMIN  05/01/2019  . TETANUS/TDAP  12/13/2023  . PNA vac Low Risk Adult  Completed       Plan:      Reviewed health maintenance screenings with patient today and relevant education, vaccines, and/or referrals were provided.   Continue doing brain stimulating activities (puzzles, reading, adult coloring books, staying active) to keep memory sharp.   Continue to eat heart healthy diet (full of fruits, vegetables, whole grains, lean protein, water--limit salt, fat, and sugar intake) and increase physical activity as tolerated.  I have personally reviewed and noted the following in the patient's chart:   . Medical and social history . Use of alcohol, tobacco or illicit drugs  . Current medications and supplements . Functional ability and status . Nutritional status . Physical activity . Advanced directives . List of other physicians . Screenings to include cognitive, depression, and falls . Referrals and appointments  In addition, I have reviewed and discussed with patient certain preventive protocols, quality metrics, and best practice recommendations. A written personalized care plan for preventive services as well as general preventive health recommendations were provided to patient.     Michiel Cowboy, RN  11/23/2018  Medical screening examination/treatment/procedure(s) were performed by non-physician practitioner and as supervising physician I was immediately available for consultation/collaboration. I agree with above. Cathlean Cower, MD

## 2018-12-05 LAB — HM DIABETES EYE EXAM

## 2019-01-29 ENCOUNTER — Encounter: Payer: Self-pay | Admitting: Internal Medicine

## 2019-02-25 DIAGNOSIS — H2513 Age-related nuclear cataract, bilateral: Secondary | ICD-10-CM | POA: Diagnosis not present

## 2019-02-25 DIAGNOSIS — H401132 Primary open-angle glaucoma, bilateral, moderate stage: Secondary | ICD-10-CM | POA: Diagnosis not present

## 2019-02-25 DIAGNOSIS — H35033 Hypertensive retinopathy, bilateral: Secondary | ICD-10-CM | POA: Diagnosis not present

## 2019-04-05 ENCOUNTER — Ambulatory Visit: Payer: Medicare HMO | Admitting: Internal Medicine

## 2019-04-05 ENCOUNTER — Other Ambulatory Visit: Payer: Self-pay

## 2019-04-05 ENCOUNTER — Telehealth: Payer: Self-pay

## 2019-04-05 ENCOUNTER — Encounter: Payer: Self-pay | Admitting: Internal Medicine

## 2019-04-05 VITALS — BP 140/90 | HR 77 | Temp 97.6°F | Ht 73.0 in | Wt 248.2 lb

## 2019-04-05 DIAGNOSIS — I4821 Permanent atrial fibrillation: Secondary | ICD-10-CM | POA: Diagnosis not present

## 2019-04-05 DIAGNOSIS — Z7901 Long term (current) use of anticoagulants: Secondary | ICD-10-CM

## 2019-04-05 DIAGNOSIS — Z86010 Personal history of colonic polyps: Secondary | ICD-10-CM | POA: Diagnosis not present

## 2019-04-05 NOTE — Progress Notes (Signed)
Edward Pittman 78 y.o. January 27, 1941 [id number]  Assessment & Plan:   Encounter Diagnoses  Name Primary?  Marland Kitchen Hx of adenomatous polyp of colon Yes  . Long term current use of anticoagulant   . Permanent atrial fibrillation [facility])     Patient is interested in pursuing a repeat colonoscopy, he is only a few months beyond age 26 which is a cutoff point it is recommended but given the overall history and his health status he and I have decided to pursue what will likely be one more colonoscopy.  Hold Xarelto 2 days prior to the procedure, 1 day is acceptable as well.  We will clarify with his cardiologist, I did explain the extra rare but real risk of stroke when off anticoagulation in the setting of his atrial fibrillation.  Other typical risks of colonoscopy including bleeding perforation missed lesions and reactions to medications reviewed as well. I appreciate the opportunity to care for this patient. CC: [name], MD   Subjective:   Chief Complaint: History of colon polyps, recall colonoscopy, on Xarelto  HPI The patient is here for history of colon polyps.  Though he is 21 he is interested in repeating a colonoscopy.  He takes Xarelto for permanent atrial fibrillation.  He is not having any active GI symptoms at this time.  Last colonoscopy 10 years ago showed some hyperplastic polyps there is a remote history of tubulovillous adenoma of the rectum.  He is not having any problems with bowel irregularities or bleeding.  [place] time of year so we will schedule this for early November.  Allergies  Allergen Reactions  . Benazepril Cough   Current Meds  Medication Sig  . diltiazem (CARDIZEM CD) 240 MG 24 hr capsule TAKE 1 CAPSULE(240 MG) BY MOUTH DAILY  . latanoprost (XALATAN) 0.005 % ophthalmic solution INT 1 GTT IN OU QD IN THE EVE  . metoprolol tartrate (LOPRESSOR) 100 MG tablet Take 1 tablet  (100 mg total) by mouth 2 (two) times daily.  . rivaroxaban (XARELTO) 20 MG TABS tablet Take 1 tablet (20 mg total) by mouth daily with supper.  . simvastatin (ZOCOR) 40 MG tablet Take 1 tablet (40 mg total) by mouth daily at 6 PM.   Past Medical History:  Diagnosis Date  . Chronic systolic dysfunction of left ventricle   . DJD (degenerative joint disease) of hip   . Dyslipidemia   . HTN (hypertension)   . Hx of adenomatous colonic polyps   . Hx of colonoscopy   . Keloid   . Mild mitral regurgitation by prior echocardiogram   . Nonischemic cardiomyopathy ([place])   . Persistent atrial fibrillation [facility])    s/p ablation 4/16  Dr. Rayann Heman   Past Surgical History:  Procedure Laterality Date  . ATRIAL FIBRILLATION ABLATION N/A 10/11/2011   Procedure: ATRIAL FIBRILLATION ABLATION;  Surgeon: Thompson Grayer, MD;  Location: [facility] CATH LAB;  Service: Cardiovascular;  Laterality: N/A;  . COLONOSCOPY    . FEMORAL HERNIA REPAIR     at age 22  . HERNIA REPAIR    . 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      4 kids + grandkids      Former smoker, no current tobacco alcohol or drug use      family history includes Brain cancer in his mother and sister; Breast cancer in his mother and sister; Colon cancer in an other family member; Diabetes in his sister and sister; Heart attack in his father; Lung cancer in his brother and brother; Prostate cancer in an other family member.   Review of Systems As per HPI.  Remainder the review of systems negative at this time.  Objective:   Physical Exam @BP  140/90 (BP Location: Left Arm, Patient Position: Sitting)   Pulse 77   Temp 97.6 F (36.4 C) (Temporal)   Ht 6\' 1"   (1.854 m)   Wt 248 lb 3.2 oz (112.6 kg)   SpO2 97%   BMI 32.75 kg/m @  General:  NAD Eyes:   anicteric Lungs:  clear Heart::  S1S2 no rubs, murmurs or gallops+ Ext:   no edema, cyanosis or clubbing    Data Reviewed:  See HPI

## 2019-04-05 NOTE — Telephone Encounter (Signed)
[facility] Pre-operative Risk Assessment     Request for surgical clearance:     Endoscopy Procedure  What type of surgery is being performed?     05/06/2019  When is this surgery scheduled?     Colonoscopy  What type of clearance is required ?   Pharmacy  Are there any medications that need to be held prior to surgery and how long? Xarelto, 2 days  Practice name and name of physician performing surgery?      [facility]  What is your office phone and fax number?      Phone- [phone]  [phone]  Anesthesia type (None, local, MAC, general) ?       MAC

## 2019-04-05 NOTE — Patient Instructions (Addendum)
You have been scheduled for a colonoscopy. Please follow written instructions given to you at your visit today.  Please pick up your prep supplies at the pharmacy within the next 1-3 days. If you use inhalers (even only as needed), please bring them with you on the day of your procedure.  You will be contacted by our office prior to your procedure for directions on holding your Xarelto.  If you do not hear from our office 1 week prior to your scheduled procedure, please call [phone] to discuss.    I appreciate the opportunity to care for you. Silvano Rusk, MD, [facility]

## 2019-04-05 NOTE — Progress Notes (Signed)
[place] medications none. Elta [place] there is verbal orders to Tylenol and fentanyl

## 2019-04-06 ENCOUNTER — Encounter: Payer: Self-pay | Admitting: Internal Medicine

## 2019-04-08 NOTE — Telephone Encounter (Signed)
Patient with diagnosis of Atrial Fibrillation on Xarelto 20 mg daily for anticoagulation.    Procedure: Colonscopy Date of procedure: 05/06/2019  CHADS2-VASc score of 4 (HTN, AGE 77 yrs, DM2) - classified a low/moderate risk  CrCl 118 ml/min (most recent BMET from 04/2018)  Per office protocol, patient can hold Xarelto for 1-2 days prior to procedure.

## 2019-04-08 NOTE — Telephone Encounter (Signed)
   Primary Cardiologist: Minus Breeding, MD  Chart reviewed as part of pre-operative protocol coverage. Given past medical history and time since last visit, based on ACC/AHA guidelines, Arlynn Veasley [place] would be at acceptable risk for the planned procedure without further cardiovascular testing.   Per pharmacy: Patient with diagnosis of Atrial Fibrillation on Xarelto 20 mg daily for anticoagulation with a CHADS2-VASc score of 4 (HTN, AGE 78 yrs, DM2) - classified a low/moderate risk  CrCl 118 ml/min (most recent BMET from 04/2018)  Per office protocol, patient can hold Xarelto for 1-2 days prior to procedure.    I will route this recommendation to the requesting party via Epic fax function and remove from pre-op pool.  Please call with questions.  Kathyrn Drown, NP 04/08/2019, 2:49 PM

## 2019-04-10 NOTE — Telephone Encounter (Signed)
Patient informed to hold his xarelto 2 days per Dr Carlean Purl since they okayed 1-2 days. He verbalized understanding.

## 2019-05-01 ENCOUNTER — Telehealth: Payer: Self-pay

## 2019-05-01 ENCOUNTER — Encounter: Payer: Self-pay | Admitting: Internal Medicine

## 2019-05-01 ENCOUNTER — Other Ambulatory Visit: Payer: Self-pay | Admitting: Internal Medicine

## 2019-05-01 DIAGNOSIS — R000 Tachycardia, unspecified: Secondary | ICD-10-CM

## 2019-05-01 DIAGNOSIS — I4819 Other persistent atrial fibrillation: Secondary | ICD-10-CM

## 2019-05-01 MED ORDER — DILTIAZEM HCL ER COATED BEADS 240 MG PO CP24: ORAL_CAPSULE | ORAL | 0 refills | Status: DC

## 2019-05-01 NOTE — Telephone Encounter (Signed)
RX sent  TJ 

## 2019-05-01 NOTE — Telephone Encounter (Signed)
Rf rq for diltalizem from [place].   Pt contacted and stated the earliest he could come in was December. Appt made for 05/28/2019 at 8:40am. Please advise if a 30 day supply can be sent in.

## 2019-05-01 NOTE — Telephone Encounter (Signed)
Pt informed rx has been sent in.  

## 2019-05-06 ENCOUNTER — Encounter: Payer: Self-pay | Admitting: Internal Medicine

## 2019-05-06 ENCOUNTER — Ambulatory Visit (AMBULATORY_SURGERY_CENTER): Payer: Medicare HMO | Admitting: Internal Medicine

## 2019-05-06 ENCOUNTER — Other Ambulatory Visit: Payer: Self-pay

## 2019-05-06 VITALS — BP 141/90 | HR 92 | Temp 98.5°F | Resp 19 | Ht 73.0 in | Wt 248.0 lb

## 2019-05-06 DIAGNOSIS — I10 Essential (primary) hypertension: Secondary | ICD-10-CM | POA: Diagnosis not present

## 2019-05-06 DIAGNOSIS — D120 Benign neoplasm of cecum: Secondary | ICD-10-CM

## 2019-05-06 DIAGNOSIS — Z86010 Personal history of colonic polyps: Secondary | ICD-10-CM | POA: Diagnosis not present

## 2019-05-06 DIAGNOSIS — D123 Benign neoplasm of transverse colon: Secondary | ICD-10-CM

## 2019-05-06 DIAGNOSIS — E785 Hyperlipidemia, unspecified: Secondary | ICD-10-CM | POA: Diagnosis not present

## 2019-05-06 DIAGNOSIS — I4891 Unspecified atrial fibrillation: Secondary | ICD-10-CM | POA: Diagnosis not present

## 2019-05-06 LAB — HM COLONOSCOPY

## 2019-05-06 MED ORDER — SODIUM CHLORIDE 0.9 % IV SOLN: 500.0000 mL | Freq: Once | INTRAVENOUS | Status: DC

## 2019-05-06 NOTE — Op Note (Signed)
[facility] Patient Name: Edward Pittman Procedure Date: 05/06/2019 7:57 AM MRN: [id number] Endoscopist: Gatha Mayer , MD Age: 78 Referring MD:  Date of Birth: Feb 26, 1941 Gender: Male Account #: [id number] Procedure:                Colonoscopy Indications:              Surveillance: Personal history of adenomatous                            polyps on last colonoscopy > 5 years ago Medicines:                Propofol per Anesthesia, Monitored Anesthesia Care Procedure:                Pre-Anesthesia Assessment:                           - Prior to the procedure, a History and Physical                            was performed, and patient medications and                            allergies were reviewed. The patient's tolerance of                            previous anesthesia was also reviewed. The risks                            and benefits of the procedure and the sedation                            options and risks were discussed with the patient.                            All questions were answered, and informed consent                            was obtained. Prior Anticoagulants: The patient                            last took Xarelto (rivaroxaban) 2 days prior to the                            procedure. ASA Grade Assessment: III - A patient                            with severe systemic disease. After reviewing the                            risks and benefits, the patient was deemed in                            satisfactory condition to undergo the procedure.  After obtaining informed consent, the colonoscope                            was passed under direct vision. Throughout the                            procedure, the patient's blood pressure, pulse, and                            oxygen saturations were monitored continuously. The                            Colonoscope was introduced through the anus and       advanced to the the cecum, identified by                            appendiceal orifice and ileocecal valve. The                            colonoscopy was performed without difficulty. The                            patient tolerated the procedure well. The quality                            of the bowel preparation was excellent. The                            ileocecal valve, appendiceal orifice, and rectum                            were photographed. The bowel preparation used was                            Miralax via split dose instruction. Scope In: 8:01:54 AM Scope Out: 8:17:26 AM Scope Withdrawal Time: 0 hours 12 minutes 48 seconds  Total Procedure Duration: 0 hours 15 minutes 32 seconds  Findings:                 The perianal examination was normal.                           The digital rectal exam findings include enlarged                            prostate. Pertinent negatives include no palpable                            rectal lesions.                           Three sessile polyps were found in the transverse                            colon and cecum. The polyps were diminutive  in                            size. These polyps were removed with a cold snare.                            Resection and retrieval were complete. Verification                            of patient identification for the specimen was                            done. Estimated blood loss was minimal.                           The exam was otherwise without abnormality on                            direct and retroflexion views. Complications:            No immediate complications. Estimated Blood Loss:     Estimated blood loss was minimal. Impression:               - Enlarged prostate found on digital rectal exam.                           - Three diminutive polyps in the transverse colon                            and in the cecum, removed with a cold snare.                             Resected and retrieved.                           - The examination was otherwise normal on direct                            and retroflexion views.                           - Personal history of colonic polyps. Remote TV                            adenoma Recommendation:           - Patient has a contact number available for                            emergencies. The signs and symptoms of potential                            delayed complications were discussed with the                            patient. Return to normal activities tomorrow.  Written discharge instructions were provided to the                            patient.                           - Resume previous diet.                           - Continue present medications.                           - Resume Xarelto (rivaroxaban) at prior dose                            tomorrow.                           - No repeat colonoscopy due to age. Gatha Mayer, MD 05/06/2019 8:27:20 AM This report has been signed electronically.

## 2019-05-06 NOTE — Progress Notes (Signed)
Called to room to assist during endoscopic procedure.  Patient ID and intended procedure confirmed with present staff. Received instructions for my participation in the procedure from the performing physician.  

## 2019-05-06 NOTE — Progress Notes (Signed)
Report to PACU, RN, vss, BBS= Clear.  

## 2019-05-06 NOTE — Patient Instructions (Addendum)
I found and removed three tiny polyps.  No signs of cancer.  Restart Xarelto tomorrow  I do not anticipate you will need a repeat routine colonoscopy.  I appreciate the opportunity to care for you. Gatha Mayer, MD, FACG  YOU HAD AN ENDOSCOPIC PROCEDURE TODAY AT [facility]:   Refer to the procedure report that was given to you for any specific questions about what was found during the examination.  If the procedure report does not answer your questions, please call your gastroenterologist to clarify.  If you requested that your care partner not be given the details of your procedure findings, then the procedure report has been included in a sealed envelope for you to review at your convenience later.  **Handout given on polyps**  YOU SHOULD EXPECT: Some feelings of bloating in the abdomen. Passage of more gas than usual.  Walking can help get rid of the air that was put into your GI tract during the procedure and reduce the bloating. If you had a lower endoscopy (such as a colonoscopy or flexible sigmoidoscopy) you may notice spotting of blood in your stool or on the toilet paper. If you underwent a bowel prep for your procedure, you may not have a normal bowel movement for a few days.  Please Note:  You might notice some irritation and congestion in your nose or some drainage.  This is from the oxygen used during your procedure.  There is no need for concern and it should clear up in a day or so.  SYMPTOMS TO REPORT IMMEDIATELY:   Following lower endoscopy (colonoscopy or flexible sigmoidoscopy):  Excessive amounts of blood in the stool  Significant tenderness or worsening of abdominal pains  Swelling of the abdomen that is new, acute  Fever of 100F or higher   For urgent or emergent issues, a gastroenterologist can be reached at any hour by calling [phone].   DIET:  We do recommend a small meal at first, but then you may proceed to your regular diet.   Drink plenty of fluids but you should avoid alcoholic beverages for 24 hours.  ACTIVITY:  You should plan to take it easy for the rest of today and you should NOT DRIVE or use heavy machinery until tomorrow (because of the sedation medicines used during the test).    FOLLOW UP: Our staff will call the number listed on your records 48-72 hours following your procedure to check on you and address any questions or concerns that you may have regarding the information given to you following your procedure. If we do not reach you, we will leave a message.  We will attempt to reach you two times.  During this call, we will ask if you have developed any symptoms of COVID 19. If you develop any symptoms (ie: fever, flu-like symptoms, shortness of breath, cough etc.) before then, please call [phone].  If you test positive for Covid 19 in the 2 weeks post procedure, please call and report this information to [place].    If any biopsies were taken you will be contacted by phone or by letter within the next 1-3 weeks.  Please call us at [phone] if you have not heard about the biopsies in 3 weeks.    SIGNATURES/CONFIDENTIALITY: You and/or your care partner have signed paperwork which will be entered into your electronic medical record.  These signatures attest to the fact that that the information above on your After Visit Summary has  been reviewed and is understood.  Full responsibility of the confidentiality of this discharge information lies with you and/or your care-partner.

## 2019-05-06 NOTE — Progress Notes (Signed)
Temp check by:JB Vital check by:[place]  The medical and surgical history was reviewed and updated per patients report.

## 2019-05-08 ENCOUNTER — Telehealth: Payer: Self-pay | Admitting: *Deleted

## 2019-05-08 NOTE — Telephone Encounter (Signed)
  Follow up Call-  Call back number 05/06/2019  Post procedure Call Back phone  # [phone]  Permission to leave phone message No  Some recent data might be hidden     Patient questions:  Do you have a fever, pain , or abdominal swelling? No. Pain Score  0 *  Have you tolerated food without any problems? Yes.    Have you been able to return to your normal activities? No.  Do you have any questions about your discharge instructions: Diet   No. Medications  No. Follow up visit  No.  Do you have questions or concerns about your Care? No.  Actions: * If pain score is 4 or above: No action needed, pain <4.  1. Have you developed a fever since your procedure? no  2.   Have you had an respiratory symptoms (SOB or cough) since your procedure? no  3.   Have you tested positive for COVID 19 since your procedure no  4.   Have you had any family members/close contacts diagnosed with the COVID 19 since your procedure?  no   If yes to any of these questions please route to Joylene Mahmood, RN and Alphonsa Gin, Therapist, sports.

## 2019-05-08 NOTE — Telephone Encounter (Signed)
  Follow up Call-  Call back number 05/06/2019  Post procedure Call Back phone  # [phone]  Permission to leave phone message No  Some recent data might be hidden     Patient questions:  VM box has not been set up yet.

## 2019-05-08 NOTE — Telephone Encounter (Signed)
Pt returned your call and left a message with answering service. Pls call him again.

## 2019-05-15 ENCOUNTER — Encounter: Payer: Self-pay | Admitting: Internal Medicine

## 2019-05-15 NOTE — Progress Notes (Signed)
Adenoma and ssp - diminutive both No recall he is 86

## 2019-05-21 ENCOUNTER — Other Ambulatory Visit: Payer: Self-pay

## 2019-05-21 MED ORDER — METOPROLOL TARTRATE 100 MG PO TABS: 100.0000 mg | ORAL_TABLET | Freq: Two times a day (BID) | ORAL | 0 refills | Status: DC

## 2019-05-28 ENCOUNTER — Other Ambulatory Visit: Payer: Self-pay

## 2019-05-28 ENCOUNTER — Other Ambulatory Visit (INDEPENDENT_AMBULATORY_CARE_PROVIDER_SITE_OTHER): Payer: Medicare HMO

## 2019-05-28 ENCOUNTER — Encounter: Payer: Self-pay | Admitting: Internal Medicine

## 2019-05-28 ENCOUNTER — Ambulatory Visit (INDEPENDENT_AMBULATORY_CARE_PROVIDER_SITE_OTHER): Payer: Medicare HMO | Admitting: Internal Medicine

## 2019-05-28 VITALS — BP 126/56 | HR 90 | Temp 98.2°F | Ht 73.0 in | Wt 242.0 lb

## 2019-05-28 DIAGNOSIS — Z0000 Encounter for general adult medical examination without abnormal findings: Secondary | ICD-10-CM

## 2019-05-28 DIAGNOSIS — R072 Precordial pain: Secondary | ICD-10-CM | POA: Diagnosis not present

## 2019-05-28 DIAGNOSIS — E118 Type 2 diabetes mellitus with unspecified complications: Secondary | ICD-10-CM

## 2019-05-28 DIAGNOSIS — Z23 Encounter for immunization: Secondary | ICD-10-CM | POA: Diagnosis not present

## 2019-05-28 DIAGNOSIS — I10 Essential (primary) hypertension: Secondary | ICD-10-CM

## 2019-05-28 DIAGNOSIS — N400 Enlarged prostate without lower urinary tract symptoms: Secondary | ICD-10-CM

## 2019-05-28 DIAGNOSIS — E785 Hyperlipidemia, unspecified: Secondary | ICD-10-CM

## 2019-05-28 DIAGNOSIS — R0789 Other chest pain: Secondary | ICD-10-CM

## 2019-05-28 DIAGNOSIS — I4891 Unspecified atrial fibrillation: Secondary | ICD-10-CM

## 2019-05-28 DIAGNOSIS — I208 Other forms of angina pectoris: Secondary | ICD-10-CM

## 2019-05-28 DIAGNOSIS — Z0001 Encounter for general adult medical examination with abnormal findings: Secondary | ICD-10-CM

## 2019-05-28 DIAGNOSIS — I2089 Other forms of angina pectoris: Secondary | ICD-10-CM

## 2019-05-28 LAB — MICROALBUMIN / CREATININE URINE RATIO
Creatinine,U: 236.1 mg/dL
Microalb Creat Ratio: 1 mg/g (ref 0.0–30.0)
Microalb, Ur: 2.3 mg/dL — ABNORMAL HIGH (ref 0.0–1.9)

## 2019-05-28 LAB — URINALYSIS, ROUTINE W REFLEX MICROSCOPIC
Ketones, ur: NEGATIVE
Leukocytes,Ua: NEGATIVE
Nitrite: NEGATIVE
RBC / HPF: NONE SEEN (ref 0–?)
Specific Gravity, Urine: >=1.03 — AB (ref 1.000–1.030)
Total Protein, Urine: NEGATIVE
Urine Glucose: NEGATIVE
Urobilinogen, UA: 1 (ref 0.0–1.0)
pH: 5.5 (ref 5.0–8.0)

## 2019-05-28 LAB — TROPONIN I (HIGH SENSITIVITY): High Sens Troponin I: 50 ng/L (ref 2–17)

## 2019-05-28 LAB — PSA: PSA: 0.67 ng/mL (ref 0.10–4.00)

## 2019-05-28 LAB — CBC WITH DIFFERENTIAL/PLATELET
Basophils Absolute: 0 10*3/uL (ref 0.0–0.1)
Basophils Relative: 0.8 % (ref 0.0–3.0)
Eosinophils Absolute: 0.1 10*3/uL (ref 0.0–0.7)
Eosinophils Relative: 1.9 % (ref 0.0–5.0)
HCT: 46 % (ref 39.0–52.0)
Hemoglobin: 15.6 g/dL (ref 13.0–17.0)
Lymphocytes Relative: 33.7 % (ref 12.0–46.0)
Lymphs Abs: 2.2 10*3/uL (ref 0.7–4.0)
MCHC: 34 g/dL (ref 30.0–36.0)
MCV: 98.6 fl (ref 78.0–100.0)
Monocytes Absolute: 0.6 10*3/uL (ref 0.1–1.0)
Monocytes Relative: 10 % (ref 3.0–12.0)
Neutro Abs: 3.4 10*3/uL (ref 1.4–7.7)
Neutrophils Relative %: 53.6 % (ref 43.0–77.0)
Platelets: 183 10*3/uL (ref 150.0–400.0)
RBC: 4.66 Mil/uL (ref 4.22–5.81)
RDW: 13.3 % (ref 11.5–15.5)
WBC: 6.4 10*3/uL (ref 4.0–10.5)

## 2019-05-28 LAB — HEPATIC FUNCTION PANEL
ALT: 30 U/L (ref 0–53)
AST: 35 U/L (ref 0–37)
Albumin: 4.4 g/dL (ref 3.5–5.2)
Alkaline Phosphatase: 299 U/L — ABNORMAL HIGH (ref 39–117)
Bilirubin, Direct: 0.2 mg/dL (ref 0.0–0.3)
Total Bilirubin: 1 mg/dL (ref 0.2–1.2)
Total Protein: 7.8 g/dL (ref 6.0–8.3)

## 2019-05-28 LAB — BASIC METABOLIC PANEL
BUN: 13 mg/dL (ref 6–23)
CO2: 26 mEq/L (ref 19–32)
Calcium: 9.7 mg/dL (ref 8.4–10.5)
Chloride: 103 mEq/L (ref 96–112)
Creatinine, Ser: 0.85 mg/dL (ref 0.40–1.50)
GFR: 105.94 mL/min (ref >60.00)
Glucose, Bld: 145 mg/dL — ABNORMAL HIGH (ref 70–99)
Potassium: 4.4 mEq/L (ref 3.5–5.1)
Sodium: 138 mEq/L (ref 135–145)

## 2019-05-28 LAB — LIPID PANEL
Cholesterol: 135 mg/dL (ref 0–200)
HDL: 33.2 mg/dL — ABNORMAL LOW (ref >39.00)
LDL Cholesterol: 91 mg/dL (ref 0–99)
NonHDL: 101.84
Total CHOL/HDL Ratio: 4
Triglycerides: 55 mg/dL (ref 0.0–149.0)
VLDL: 11 mg/dL (ref 0.0–40.0)

## 2019-05-28 LAB — TSH: TSH: 2.44 u[IU]/mL (ref 0.35–4.50)

## 2019-05-28 LAB — HEMOGLOBIN A1C: Hgb A1c MFr Bld: 7.3 % — ABNORMAL HIGH (ref 4.6–6.5)

## 2019-05-28 MED ORDER — XIGDUO XR 5-1000 MG PO TB24: 1.0000 | ORAL_TABLET | Freq: Every day | ORAL | 0 refills | Status: DC

## 2019-05-28 MED ORDER — NITROGLYCERIN 0.1 MG/HR TD PT24: 0.1000 mg | MEDICATED_PATCH | Freq: Every day | TRANSDERMAL | 0 refills | Status: DC

## 2019-05-28 MED ORDER — SIMVASTATIN 40 MG PO TABS: 40.0000 mg | ORAL_TABLET | Freq: Every day | ORAL | 1 refills | Status: DC

## 2019-05-28 NOTE — Progress Notes (Signed)
Subjective:  Patient ID: Edward Pittman, male    DOB: [birth year]  Age: 78 y.o. MRN: [id number]  CC: Annual Exam, Diabetes, Hyperlipidemia, and Atrial Fibrillation  This visit occurred during the SARS-CoV-2 public health emergency.  Safety protocols were in place, including screening questions prior to the visit, additional usage of staff PPE, and extensive cleaning of exam room while observing appropriate contact time as indicated for disinfecting solutions.    HPI Say Fripp Blando presents for a CPX.  He tells me his blood pressure has been well controlled.  He mentions today that for the last several years he has had a chest pressure sensation in the precordial region and in his epigastric region with exertion.  The chest pressure has not recently worsened.  He denies diaphoresis, palpitations, dizziness, lightheadedness, DOE, edema, or fatigue.  Outpatient Medications Prior to Visit  Medication Sig Dispense Refill   diltiazem (CARDIZEM CD) 240 MG 24 hr capsule TAKE 1 CAPSULE(240 MG) BY MOUTH DAILY 30 capsule 0   latanoprost (XALATAN) 0.005 % ophthalmic solution INT 1 GTT IN OU QD IN THE EVE     metoprolol tartrate (LOPRESSOR) 100 MG tablet Take 1 tablet (100 mg total) by mouth 2 (two) times daily. Keep upcoming appt. 30 tablet 0   rivaroxaban (XARELTO) 20 MG TABS tablet Take 1 tablet (20 mg total) by mouth daily with supper. 90 tablet 3   simvastatin (ZOCOR) 40 MG tablet Take 1 tablet (40 mg total) by mouth daily at 6 PM. 90 tablet 3   No facility-administered medications prior to visit.     ROS Review of Systems  Constitutional: Negative.  Negative for diaphoresis and fatigue.  HENT: Negative.   Eyes: Negative.   Respiratory: Negative for cough, chest tightness, shortness of breath and wheezing.   Cardiovascular: Positive for chest pain and leg swelling. Negative for palpitations.  Gastrointestinal: Negative for abdominal pain, constipation, diarrhea, nausea and vomiting.    Endocrine: Negative.   Genitourinary: Negative.  Negative for difficulty urinating, dysuria, testicular pain and urgency.  Musculoskeletal: Negative.  Negative for arthralgias and myalgias.  Skin: Negative.  Negative for color change.  Neurological: Negative.  Negative for dizziness, weakness, light-headedness and numbness.  Hematological: Negative for adenopathy. Does not bruise/bleed easily.  Psychiatric/Behavioral: Negative.     Objective:  BP (!) 126/56 (BP Location: Left Arm, Patient Position: Sitting, Cuff Size: Large)    Pulse 90    Temp 98.2 F (36.8 C) (Oral)    Ht 6\' 1"  (1.854 m)    Wt 242 lb (109.8 kg)    SpO2 98%    BMI 31.93 kg/m   BP Readings from Last 3 Encounters:  05/28/19 (!) 126/56  05/06/19 (!) 141/90  04/05/19 140/90    Wt Readings from Last 3 Encounters:  05/28/19 242 lb (109.8 kg)  05/06/19 248 lb (112.5 kg)  04/05/19 248 lb 3.2 oz (112.6 kg)    Physical Exam Vitals signs reviewed.  Constitutional:      Appearance: Normal appearance.  HENT:     Nose: Nose normal.     Mouth/Throat:     Mouth: Mucous membranes are moist.     Pharynx: No oropharyngeal exudate.  Eyes:     General: No scleral icterus.    Conjunctiva/sclera: Conjunctivae normal.  Neck:     Musculoskeletal: Neck supple.  Cardiovascular:     Rate and Rhythm: Normal rate. Rhythm irregularly irregular.     Heart sounds: Murmur present. Systolic murmur present with a grade  of 1/6. No gallop.      Comments: EKG ---  Atrial fibrillation  ABNORMAL RHYTHM Pulmonary:     Effort: Pulmonary effort is normal.     Breath sounds: No stridor. No wheezing, rhonchi or rales.  Abdominal:     General: Abdomen is protuberant. Bowel sounds are normal. There is no distension.     Palpations: Abdomen is soft. There is no hepatomegaly, splenomegaly or mass.     Tenderness: There is no abdominal tenderness. There is no guarding.     Hernia: No hernia is present. There is no hernia in the left inguinal  area or right inguinal area.  Genitourinary:    Pubic Area: No rash.      Penis: Normal. No discharge, swelling or lesions.      Scrotum/Testes: Normal.        Right: Mass or tenderness not present.        Left: Mass or tenderness not present.     Epididymis:     Right: Normal. Not inflamed or enlarged. No mass.     Left: Normal. Not inflamed or enlarged. No mass.     Prostate: Enlarged (1+ smooth symm BPH). Not tender and no nodules present.     Rectum: Normal. Guaiac result negative. No mass, tenderness, anal fissure, external hemorrhoid or internal hemorrhoid. Normal anal tone.  Musculoskeletal:     Right lower leg: Edema (trace pitting) present.     Left lower leg: Edema (trace pitting) present.  Lymphadenopathy:     Cervical: No cervical adenopathy.     Lower Body: No right inguinal adenopathy. No left inguinal adenopathy.  Skin:    General: Skin is warm and dry.     Coloration: Skin is not pale.  Neurological:     General: No focal deficit present.     Mental Status: He is alert.  Psychiatric:        Mood and Affect: Mood normal.        Behavior: Behavior normal.     Lab Results  Component Value Date   WBC 6.4 05/28/2019   HGB 15.6 05/28/2019   HCT 46.0 05/28/2019   PLT 183.0 05/28/2019   GLUCOSE 145 (H) 05/28/2019   CHOL 135 05/28/2019   TRIG 55.0 05/28/2019   HDL 33.20 (L) 05/28/2019   LDLCALC 91 05/28/2019   ALT 30 05/28/2019   AST 35 05/28/2019   NA 138 05/28/2019   K 4.4 05/28/2019   CL 103 05/28/2019   CREATININE 0.85 05/28/2019   BUN 13 05/28/2019   CO2 26 05/28/2019   TSH 2.44 05/28/2019   PSA 0.67 05/28/2019   INR 2.8 08/03/2012   HGBA1C 7.3 (H) 05/28/2019   MICROALBUR 2.3 (H) 05/28/2019    Dg Chest 2 View  Result Date: 04/30/2018 CLINICAL DATA:  One month history of nonproductive cough and shortness of breath. Former smoker. Current history of atrial fibrillation and hypertension. Current history of nonischemic cardiomyopathy. EXAM: CHEST - 2  VIEW COMPARISON:  10/28/2006, 10/26/2006. FINDINGS: Cardiac silhouette markedly enlarged, increased in size since 2008. Thoracic aorta mildly tortuous and atherosclerotic. Hilar and mediastinal contours otherwise unremarkable. Lungs clear. Bronchovascular markings normal. Pulmonary vascularity normal. No visible pleural effusions. No pneumothorax. Degenerative changes involving the thoracic and UPPER lumbar spine. IMPRESSION: Marked cardiomegaly.  No acute cardiopulmonary disease. Electronically Signed   By: Evangeline Dakin M.D.   On: 04/30/2018 14:25    Assessment & Plan:   Kiara was seen today for annual exam, diabetes, hyperlipidemia  and atrial fibrillation.  Diagnoses and all orders for this visit:  Essential hypertension- His blood pressure is adequately well controlled. -     CBC with Differential; Future -     Basic metabolic panel; Future -     Urinalysis, Routine w reflex microscopic; Future -     EKG 12-Lead  Type 2 diabetes mellitus with complication, without long-term current use of insulin ([place])- His A1c is up to 7.3%.  I have asked him to start taking the combination of an SGLT2 inhibitor and Metformin to achieve glycemic control. -     HM Diabetes Foot Exam -     Basic metabolic panel; Future -     Microalbumin / creatinine urine ratio; Future -     Hemoglobin A1c; Future -     Urinalysis, Routine w reflex microscopic; Future -     Dapagliflozin-metFORMIN HCl ER (XIGDUO XR) 10-998 MG TB24; Take 1 tablet by mouth daily.  Hyperlipidemia with target LDL less than 100- He has achieved his LDL goal and is doing well on the statin. -     Lipid panel; Future -     TSH; Future -     Hepatic function panel; Future -     simvastatin (ZOCOR) 40 MG tablet; Take 1 tablet (40 mg total) by mouth daily at 6 PM.  Routine general medical examination at a health care facility-exam completed, labs reviewed, vaccines reviewed and updated, cancer screenings are up-to-date, patient education  material was given.  Benign prostatic hyperplasia without lower urinary tract symptoms-his PSA is low which is reassuring that he does not have prostate cancer.  He has no symptoms that need to be treated. -     PSA; Future  Need for influenza vaccination -     Flu Vaccine QUAD High Dose(Fluad)  Atrial fibrillation, unspecified type (HCC)-he has adequate rate control.  We will continue anticoagulation with the DOAC. -     EKG 12-Lead  Left chest pressure -     Troponin I (High Sensitivity); Future -     Myocardial Perfusion Imaging; Future  Precordial pain -     Myocardial Perfusion Imaging; Future  Stable angina pectoris [facility])- He has chest pain that is suspicious for stable angina.  His EKG shows A. fib but no signs of ischemia.  His high sensitive troponin is very mildly elevated.  I recommended that he start using topical nitroglycerin and to notify his cardiologist that he needs to be seen as soon as possible.  In the meantime I will try to get a myocardial profusion imaging completed to see if there is concern for ischemia. -     nitroGLYCERIN (NITRODUR - DOSED IN MG/24 HR) 0.1 mg/hr patch; Place 1 patch (0.1 mg total) onto the skin daily.   I am having Abhiraj H. Garrido start on nitroGLYCERIN and Xigduo XR. I am also having him maintain his rivaroxaban, latanoprost, diltiazem, metoprolol tartrate, and simvastatin.  Meds ordered this encounter  Medications   nitroGLYCERIN (NITRODUR - DOSED IN MG/24 HR) 0.1 mg/hr patch    Sig: Place 1 patch (0.1 mg total) onto the skin daily.    Dispense:  30 patch    Refill:  0   simvastatin (ZOCOR) 40 MG tablet    Sig: Take 1 tablet (40 mg total) by mouth daily at 6 PM.    Dispense:  90 tablet    Refill:  1   Dapagliflozin-metFORMIN HCl ER (XIGDUO XR) 10-998 MG TB24  Sig: Take 1 tablet by mouth daily.    Dispense:  90 tablet    Refill:  0     Follow-up: Return in about 6 months (around 11/26/2019).  Scarlette Calico, MD

## 2019-05-28 NOTE — Patient Instructions (Signed)

## 2019-05-30 ENCOUNTER — Telehealth (HOSPITAL_COMMUNITY): Payer: Self-pay

## 2019-05-30 ENCOUNTER — Telehealth: Payer: Self-pay | Admitting: Cardiology

## 2019-05-30 NOTE — Telephone Encounter (Signed)
Adenosine stress test ordered by primary care.  This is scheduled for 12/8.   Pt is scheduled to see Dr Percival Spanish on 12/7.  Lab result note from 12/1 indicates pt was instructed to contact cardiology for earlier appointment but pt reports he was unable to schedule this week. Will forward to Dr Percival Spanish regarding timing of appointments and testing.

## 2019-05-30 NOTE — Telephone Encounter (Signed)
Encounter complete. 

## 2019-05-30 NOTE — Telephone Encounter (Signed)
New Message:    Pt says he have an appointment Monday with Dr  Percival Spanish and a Stress Test on Tuesday> His question is, does he need that appt on Monday with Dr Percival Spanish or should he have his Stress Test first?

## 2019-05-31 ENCOUNTER — Other Ambulatory Visit: Payer: Self-pay | Admitting: Internal Medicine

## 2019-05-31 DIAGNOSIS — R000 Tachycardia, unspecified: Secondary | ICD-10-CM

## 2019-05-31 DIAGNOSIS — I4819 Other persistent atrial fibrillation: Secondary | ICD-10-CM

## 2019-05-31 MED ORDER — DILTIAZEM HCL ER COATED BEADS 240 MG PO CP24: ORAL_CAPSULE | ORAL | 1 refills | Status: DC

## 2019-06-01 DIAGNOSIS — Z7189 Other specified counseling: Secondary | ICD-10-CM | POA: Insufficient documentation

## 2019-06-01 DIAGNOSIS — I482 Chronic atrial fibrillation, unspecified: Secondary | ICD-10-CM | POA: Insufficient documentation

## 2019-06-01 DIAGNOSIS — E785 Hyperlipidemia, unspecified: Secondary | ICD-10-CM | POA: Insufficient documentation

## 2019-06-01 NOTE — Telephone Encounter (Signed)
I will see him before the stress test as scheduled.

## 2019-06-01 NOTE — Progress Notes (Signed)
Cardiology Office Note   Date:  06/03/2019   ID:  Edward Pittman, DOB [birth year], MRN [id number]  PCP:  [name], MD  Cardiologist:   Minus Breeding, MD    Chief Complaint  Patient presents with  . Chest Pain      History of Present Illness: Edward Pittman is a 78 y.o. male who presents for follow up of atrial fib.   He had atrial fib and had ablation but had recurrent fib.  He saw [name], MD on 12/1 and had chest pressure.  Trop and [employer] were ordered.    The troponin was high-sensitivity and came back at 50.  He reports that he has some slight chest congestion occasionally.  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  He might get a little more tired.  However, he is walking routinely.  He works with exercise bands.  He is doing sit ups.  This does not routinely bring on any chest pressure, neck or arm discomfort.  He feels his heart rate skipping once in a while with his chronic atrial fibrillation but has not had any presyncope or syncope.  He has had no acute episodes of shortness of breath, PND or orthopnea.  He said no weight gain or edema.    Past Medical History:  Diagnosis Date  . Chronic systolic dysfunction of left ventricle   . Dyslipidemia   . HTN (hypertension)   . Hx of adenomatous colonic polyps   . Hx of colonoscopy   . Keloid   . Mild mitral regurgitation by prior echocardiogram   . Nonischemic cardiomyopathy ([place])   . Persistent atrial fibrillation [facility])    s/p ablation 4/16  Dr. Rayann Heman    Past Surgical History:  Procedure Laterality Date  . ATRIAL FIBRILLATION ABLATION N/A 10/11/2011   Procedure: ATRIAL FIBRILLATION ABLATION;  Surgeon: Thompson Grayer, MD;  Location: [facility] CATH LAB;  Service: Cardiovascular;  Laterality: N/A;  . COLONOSCOPY    . FEMORAL HERNIA REPAIR     at age 75  . HERNIA REPAIR    . TEE WITHOUT CARDIOVERSION  10/10/2011   Procedure: TRANSESOPHAGEAL  ECHOCARDIOGRAM (TEE);  Surgeon: Fay Records, MD;  Location: [facility] ENDOSCOPY;  Service: Cardiovascular;  Laterality: N/A;     Current Outpatient Medications  Medication Sig Dispense Refill  . Dapagliflozin-metFORMIN HCl ER (XIGDUO XR) 10-998 MG TB24 Take 1 tablet by mouth daily. 90 tablet 0  . diltiazem (CARDIZEM CD) 240 MG 24 hr capsule TAKE 1 CAPSULE(240 MG) BY MOUTH DAILY 90 capsule 1  . latanoprost (XALATAN) 0.005 % ophthalmic solution INT 1 GTT IN OU QD IN THE EVE    . metoprolol tartrate (LOPRESSOR) 100 MG tablet Take 1 tablet (100 mg total) by mouth 2 (two) times daily. Keep upcoming appt. 30 tablet 0  . nitroGLYCERIN (NITRODUR - DOSED IN MG/24 HR) 0.1 mg/hr patch Place 1 patch (0.1 mg total) onto the skin daily. 30 patch 0  . rivaroxaban (XARELTO) 20 MG TABS tablet Take 1 tablet (20 mg total) by mouth daily with supper. 90 tablet 3  . simvastatin (ZOCOR) 40 MG tablet Take 1 tablet (40 mg total) by mouth daily at 6 PM. 90 tablet 1   No current facility-administered medications for this visit.     Allergies:   Benazepril     ROS:  Please see the history of present illness.   Otherwise, review  of systems are positive for none.   All other systems are reviewed and negative.    PHYSICAL EXAM: VS:  BP (!) 152/86   Pulse 84   Ht 6\' 1"  (1.854 m)   Wt 239 lb (108.4 kg)   SpO2 94%   BMI 31.53 kg/m  , BMI Body mass index is 31.53 kg/m. GENERAL:  Well appearing NECK:  No jugular venous distention, waveform within normal limits, carotid upstroke brisk and symmetric, no bruits, no thyromegaly LUNGS:  Clear to auscultation bilaterally CHEST:  Unremarkable HEART:  PMI not displaced or sustained,S1 and S2 within normal limits, no S3, no clicks, no rubs, no murmurs, irregular ABD:  Flat, positive bowel sounds normal in frequency in pitch, no bruits, no rebound, no guarding, no midline pulsatile mass, no hepatomegaly, no splenomegaly EXT:  2 plus pulses throughout, no edema, no cyanosis no  clubbing   EKG:  EKG is  ordered today. The ekg ordered 05/28/19 demonstrates atrial fibrillation,, rate 75 axis within normal limits, intervals within normal limits, no acute ST-T wave changes,  Irregular   Recent Labs: 05/28/2019: ALT 30; BUN 13; Creatinine, Ser 0.85; Hemoglobin 15.6; Platelets 183.0; Potassium 4.4; Sodium 138; TSH 2.44    Lipid Panel    Component Value Date/Time   CHOL 135 05/28/2019 0942   TRIG 55.0 05/28/2019 0942   HDL 33.20 (L) 05/28/2019 0942   CHOLHDL 4 05/28/2019 0942   VLDL 11.0 05/28/2019 0942   LDLCALC 91 05/28/2019 0942      Wt Readings from Last 3 Encounters:  06/03/19 239 lb (108.4 kg)  05/28/19 242 lb (109.8 kg)  05/06/19 248 lb (112.5 kg)      Other studies Reviewed: Additional studies/ records that were reviewed today include: Office records, labs, EKG Dr. Ronnald Ramp Review of the above records demonstrates:     ASSESSMENT AND PLAN:   ATRIAL FIB:    Edward Pittman has a CHA2DS2 - VASc score of 2.    No change in therapy.  He tolerates anticoagulation and rate control.   HTN:   Blood pressure is not controlled.  I asked him to get a blood pressure machine last time for the limb leads to get it checked routinely at the drugstore but he cannot get it checked at the drugstore anymore.  We gave him a blood pressure cuff and he should keep a diary.  I will be happy to review this.  DYSLIPIDEMIA:   LDL was 91.  HDL 33.2.  This is much improved compared to previous.  No change in therapy.  CHEST PRESSURE: He had some vague symptoms.  He had a minimally elevated at bedtime troponin which is completely nonspecific in the setting.  However, he has significant risk factors so perfusion imaging is indicated.  COVID EDUCATION: We talked about the vaccine.   Current medicines are reviewed at length with the patient today.  The patient does not have concerns regarding medicines.  The following changes have been made:  None  Labs/ tests ordered  today include:    No orders of the defined types were placed in this encounter.    Disposition:   FU with me in me in 12 months.    Signed, Minus Breeding, MD  06/03/2019 11:35 AM    [place]

## 2019-06-03 ENCOUNTER — Other Ambulatory Visit: Payer: Self-pay

## 2019-06-03 ENCOUNTER — Ambulatory Visit (INDEPENDENT_AMBULATORY_CARE_PROVIDER_SITE_OTHER): Payer: Medicare HMO | Admitting: Cardiology

## 2019-06-03 ENCOUNTER — Encounter: Payer: Self-pay | Admitting: Cardiology

## 2019-06-03 VITALS — BP 152/86 | HR 84 | Ht 73.0 in | Wt 239.0 lb

## 2019-06-03 DIAGNOSIS — I10 Essential (primary) hypertension: Secondary | ICD-10-CM

## 2019-06-03 DIAGNOSIS — Z7189 Other specified counseling: Secondary | ICD-10-CM

## 2019-06-03 DIAGNOSIS — I482 Chronic atrial fibrillation, unspecified: Secondary | ICD-10-CM

## 2019-06-03 DIAGNOSIS — E785 Hyperlipidemia, unspecified: Secondary | ICD-10-CM | POA: Diagnosis not present

## 2019-06-03 DIAGNOSIS — I428 Other cardiomyopathies: Secondary | ICD-10-CM | POA: Diagnosis not present

## 2019-06-03 NOTE — Telephone Encounter (Signed)
Patient seen by Dr Percival Spanish today

## 2019-06-03 NOTE — Patient Instructions (Signed)
Medication Instructions:  Your physician recommends that you continue on your current medications as directed. Please refer to the Current Medication list given to you today.  *If you need a refill on your cardiac medications before your next appointment, please call your pharmacy*  Lab Work: none If you have labs (blood work) drawn today and your tests are completely normal, you will receive your results only by: Marland Kitchen MyChart Message (if you have MyChart) OR . A paper copy in the mail If you have any lab test that is abnormal or we need to change your treatment, we will call you to review the results.  Testing/Procedures: none  Follow-Up: At [facility], you and your health needs are our priority.  As part of our continuing mission to provide you with exceptional heart care, we have created designated Provider Care Teams.  These Care Teams include your primary Cardiologist (physician) and Advanced Practice Providers (APPs -  Physician Assistants and Nurse Practitioners) who all work together to provide you with the care you need, when you need it.  Your next appointment:   12 month(s)  The format for your next appointment:   In Person  Provider:   You may see Minus Breeding, MD or one of the following Advanced Practice Providers on your designated Care Team:    Rosaria Ferries, PA-C  Jory Sims, DNP, ANP  Cadence Kathlen Mody, NP   Other Instructions  How to Take Your Blood Pressure You can take your blood pressure at home with a machine. You may need to check your blood pressure at home:  To check if you have high blood pressure (hypertension).  To check your blood pressure over time.  To make sure your blood pressure medicine is working. Supplies needed: You will need a blood pressure machine, or monitor. You can buy one at a drugstore or online. When choosing one:  Choose one with an arm cuff.  Choose one that wraps around your upper arm. Only one finger should fit  between your arm and the cuff.  Do not choose one that measures your blood pressure from your wrist or finger. Your doctor can suggest a monitor. How to prepare Avoid these things for 30 minutes before checking your blood pressure:  Drinking caffeine.  Drinking alcohol.  Eating.  Smoking.  Exercising. Five minutes before checking your blood pressure:  Pee.  Sit in a dining chair. Avoid sitting in a soft couch or armchair.  Be quiet. Do not talk. How to take your blood pressure Follow the instructions that came with your machine. If you have a digital blood pressure monitor, these may be the instructions: 1. Sit up straight. 2. Place your feet on the floor. Do not cross your ankles or legs. 3. Rest your left arm at the level of your heart. You may rest it on a table, desk, or chair. 4. Pull up your shirt sleeve. 5. Wrap the blood pressure cuff around the upper part of your left arm. The cuff should be 1 inch (2.5 cm) above your elbow. It is best to wrap the cuff around bare skin. 6. Fit the cuff snugly around your arm. You should be able to place only one finger between the cuff and your arm. 7. Put the cord inside the groove of your elbow. 8. Press the power button. 9. Sit quietly while the cuff fills with air and loses air. 10. Write down the numbers on the screen. 11. Wait 2-3 minutes and then repeat steps 1-10.  What do the numbers mean? Two numbers make up your blood pressure. The first number is called systolic pressure. The second is called diastolic pressure. An example of a blood pressure reading is "120 over 80" (or 120/80). If you are an adult and do not have a medical condition, use this guide to find out if your blood pressure is normal: Normal  First number: below 120.  Second number: below 80. Elevated  First number: 120-129.  Second number: below 80. Hypertension stage 1  First number: 130-139.  Second number: 80-89. Hypertension stage 2  First  number: 140 or above.  Second number: 79 or above. Your blood pressure is above normal even if only the top or bottom number is above normal. Follow these instructions at home:  Check your blood pressure as often as your doctor tells you to.  Take your monitor to your next doctor's appointment. Your doctor will: ? Make sure you are using it correctly. ? Make sure it is working right.  Make sure you understand what your blood pressure numbers should be.  Tell your doctor if your medicines are causing side effects. Contact a doctor if:  Your blood pressure keeps being high. Get help right away if:  Your first blood pressure number is higher than 180.  Your second blood pressure number is higher than 120. This information is not intended to replace advice given to you by your health care provider. Make sure you discuss any questions you have with your health care provider. Document Released: 05/26/2008 Document Revised: 05/26/2017 Document Reviewed: 11/20/2015 Elsevier Patient Education  2020 [employer].

## 2019-06-04 ENCOUNTER — Ambulatory Visit (HOSPITAL_COMMUNITY)
Admission: RE | Admit: 2019-06-04 | Discharge: 2019-06-04 | Disposition: A | Discharge status: Home / Self Care | Payer: Medicare HMO | Source: Ambulatory Visit | Attending: Cardiovascular Disease | Admitting: Cardiovascular Disease

## 2019-06-04 DIAGNOSIS — R0789 Other chest pain: Secondary | ICD-10-CM | POA: Insufficient documentation

## 2019-06-04 DIAGNOSIS — R072 Precordial pain: Secondary | ICD-10-CM

## 2019-06-04 LAB — MYOCARDIAL PERFUSION IMAGING
Peak HR: 91 {beats}/min
Rest HR: 83 {beats}/min
SDS: 2
SRS: 0
SSS: 2
TID: 1.11

## 2019-06-04 MED ORDER — TECHNETIUM TC 99M TETROFOSMIN IV KIT
8.0000 | PACK | Freq: Once | INTRAVENOUS | Status: DC | PRN
  Filled 2019-06-04: qty 8

## 2019-06-04 MED ORDER — TECHNETIUM TC 99M TETROFOSMIN IV KIT
23.8000 | PACK | Freq: Once | INTRAVENOUS | Status: DC | PRN
  Filled 2019-06-04: qty 24

## 2019-06-04 MED ORDER — REGADENOSON 0.4 MG/5ML IV SOLN: 0.4000 mg | Freq: Once | INTRAVENOUS | Status: DC

## 2019-06-05 ENCOUNTER — Other Ambulatory Visit: Payer: Self-pay | Admitting: Cardiology

## 2019-06-26 ENCOUNTER — Other Ambulatory Visit: Payer: Self-pay | Admitting: Internal Medicine

## 2019-06-26 DIAGNOSIS — I208 Other forms of angina pectoris: Secondary | ICD-10-CM

## 2019-06-26 MED ORDER — NITROGLYCERIN 0.1 MG/HR TD PT24: 0.1000 mg | MEDICATED_PATCH | Freq: Every day | TRANSDERMAL | 0 refills | Status: DC

## 2019-08-12 ENCOUNTER — Other Ambulatory Visit: Payer: Self-pay | Admitting: Cardiology

## 2019-08-12 DIAGNOSIS — E785 Hyperlipidemia, unspecified: Secondary | ICD-10-CM

## 2019-09-04 LAB — HM DIABETES EYE EXAM

## 2019-09-10 DIAGNOSIS — H401132 Primary open-angle glaucoma, bilateral, moderate stage: Secondary | ICD-10-CM | POA: Diagnosis not present

## 2019-09-10 DIAGNOSIS — H35033 Hypertensive retinopathy, bilateral: Secondary | ICD-10-CM | POA: Diagnosis not present

## 2019-09-18 ENCOUNTER — Other Ambulatory Visit: Payer: Self-pay

## 2019-09-18 DIAGNOSIS — I4819 Other persistent atrial fibrillation: Secondary | ICD-10-CM

## 2019-09-18 MED ORDER — RIVAROXABAN 20 MG PO TABS: 20.0000 mg | ORAL_TABLET | Freq: Every day | ORAL | 3 refills | Status: DC

## 2019-12-04 ENCOUNTER — Ambulatory Visit: Payer: Medicare HMO

## 2019-12-09 ENCOUNTER — Ambulatory Visit (INDEPENDENT_AMBULATORY_CARE_PROVIDER_SITE_OTHER): Payer: Medicare HMO

## 2019-12-09 ENCOUNTER — Other Ambulatory Visit: Payer: Self-pay

## 2019-12-09 VITALS — BP 140/80 | HR 80 | Temp 98.3°F | Ht 73.0 in | Wt 241.6 lb

## 2019-12-09 DIAGNOSIS — Z0000 Encounter for general adult medical examination without abnormal findings: Secondary | ICD-10-CM | POA: Diagnosis not present

## 2019-12-09 NOTE — Progress Notes (Signed)
Subjective:   Edward Pittman is a 79 y.o. male who presents for Medicare Annual/Subsequent preventive examination.  Review of Systems:  No Ros. Medicare Wellness Visit Cardiac Risk Factors include: advanced age (>73men, >44 women);diabetes mellitus;dyslipidemia;family history of premature cardiovascular disease;hypertension;male gender;obesity (BMI >30kg/m2)     Objective:    Vitals: BP 140/80 (BP Location: Left Arm, Patient Position: Sitting, Cuff Size: Normal)   Pulse 80   Temp 98.3 F (36.8 C)   Ht 6\' 1"  (1.854 m)   Wt 241 lb 9.6 oz (109.6 kg)   SpO2 98%   BMI 31.88 kg/m   Body mass index is 31.88 kg/m.  Advanced Directives 12/09/2019 11/23/2018 05/15/2016 10/11/2011 10/11/2011 10/10/2011  Does Patient Have a Medical Advance Directive? No No Yes Patient does not have advance directive;Patient would not like information Patient does not have advance directive Patient does not have advance directive  Type of Advance Directive - - Living will;Healthcare Power of Attorney - - -  Does patient want to make changes to medical advance directive? - - No - Patient declined - - -  Copy of [place] in Chart? - - No - copy requested - - -  Would patient like information on creating a medical advance directive? No - Patient declined No - Patient declined - - - -  Pre-existing out of facility DNR order (yellow form or pink MOST form) - - - - No -    Tobacco Social History   Tobacco Use  Smoking Status Former Smoker  . Quit date: 08/09/1999  . Years since quitting: 20.3  Smokeless Tobacco Never Used  Tobacco Comment   quit 9 years ago     Counseling given: No Comment: quit 9 years ago   Clinical Intake:  Pre-visit preparation completed: Yes  Pain : No/denies pain Pain Score: 0-No pain     BMI - recorded: 31.88 Nutritional Status: BMI > 30  Obese Nutritional Risks: None Diabetes: No CBG done?: No Did pt. bring in CBG monitor from home?: No  How  often do you need to have someone help you when you read instructions, pamphlets, or other written materials from your doctor or pharmacy?: 1 - Never What is the last grade level you completed in school?: 10th grade  Interpreter Needed?: No  Information entered by :: Kamali Sakata N. Lowell Guitar, LPN  Past Medical History:  Diagnosis Date  . Chronic systolic dysfunction of left ventricle   . Dyslipidemia   . HTN (hypertension)   . Hx of adenomatous colonic polyps   . Hx of colonoscopy   . Keloid   . Mild mitral regurgitation by prior echocardiogram   . Nonischemic cardiomyopathy ([place])   . Persistent atrial fibrillation [facility])    s/p ablation 4/16  Dr. Rayann Heman   Past Surgical History:  Procedure Laterality Date  . ATRIAL FIBRILLATION ABLATION N/A 10/11/2011   Procedure: ATRIAL FIBRILLATION ABLATION;  Surgeon: Thompson Grayer, MD;  Location: [facility] CATH LAB;  Service: Cardiovascular;  Laterality: N/A;  . COLONOSCOPY    . FEMORAL HERNIA REPAIR     at age 41  . HERNIA REPAIR    . TEE WITHOUT CARDIOVERSION  10/10/2011   Procedure: TRANSESOPHAGEAL ECHOCARDIOGRAM (TEE);  Surgeon: Fay Records, MD;  Location: [facility] ENDOSCOPY;  Service: Cardiovascular;  Laterality: N/A;   Family History  Problem Relation Age of Onset  . Brain cancer Mother   . Breast cancer Mother   . Diabetes Sister   . Breast cancer  Sister   . Brain cancer Sister   . Heart attack Father   . Lung cancer Brother   . Lung cancer Brother   . Colon cancer Other        uncle  . Prostate cancer Other        uncle  . Diabetes Sister   . Heart disease Neg Hx   . Esophageal cancer Neg Hx   . Rectal cancer Neg Hx   . Stomach cancer Neg Hx    Social History   Socioeconomic History  . Marital status: Married    Spouse name: Not on file  . Number of children: 4  . Years of education: Not on file  . Highest education level: Not on file  Occupational History  . Occupation: Autobody Repairman  Tobacco Use  . Smoking status: Former  Smoker    Quit date: 08/09/1999    Years since quitting: 20.3  . Smokeless tobacco: Never Used  . Tobacco comment: quit 9 years ago  Vaping Use  . Vaping Use: Never used  Substance and Sexual Activity  . Alcohol use: No  . Drug use: No  . Sexual activity: Not Currently  Other Topics Concern  . 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      4 kids + grandkids      Former smoker, no current tobacco alcohol or drug use      Social Determinants of Radio broadcast assistant Strain:   . Difficulty of Paying Living Expenses:   Food Insecurity:   . Worried About Charity fundraiser in the Last Year:   . Arboriculturist in the Last Year:   Transportation Needs:   . Film/video editor (Medical):   Marland Kitchen Lack of Transportation (Non-Medical):   Physical Activity:   . Days of Exercise per Week:   . Minutes of Exercise per Session:   Stress:   . Feeling of Stress :   Social Connections:   . Frequency of Communication with Friends and Family:   . Frequency of Social Gatherings with Friends and Family:   . Attends Religious Services:   . Active Member of Clubs or Organizations:   . Attends Archivist Meetings:   Marland Kitchen Marital Status:     Outpatient Encounter Medications as of 12/09/2019  Medication Sig  . diltiazem (CARDIZEM CD) 240 MG 24 hr capsule TAKE 1 CAPSULE(240 MG) BY MOUTH DAILY  . latanoprost (XALATAN) 0.005 % ophthalmic solution INT 1 GTT IN OU QD IN THE EVE  . metoprolol tartrate (LOPRESSOR) 100 MG tablet TAKE 1 TABLET BY MOUTH TWICE DAILY KEEP UPCOMING APPOINTMENT  . rivaroxaban (XARELTO) 20 MG TABS tablet Take 1 tablet (20 mg total) by mouth daily with supper.  . simvastatin (ZOCOR) 40 MG tablet TAKE 1 TABLET(40 MG) BY MOUTH DAILY AT 6 PM  . Dapagliflozin-metFORMIN HCl ER (XIGDUO XR) 10-998 MG TB24 Take 1 tablet by mouth daily. (Patient not taking: Reported on 12/09/2019)   . nitroGLYCERIN (NITRODUR - DOSED IN MG/24 HR) 0.1 mg/hr patch PLACE 1 PATCH TOPICALLY ONTO THE SKIN DAILY (Patient not taking: Reported on 12/09/2019)   No facility-administered encounter medications on file as of 12/09/2019.    Activities of Daily Living In your present state of health, do you have any difficulty performing the following activities: 12/09/2019  Hearing? N  Vision?  N  Difficulty concentrating or making decisions? N  Walking or climbing stairs? N  Dressing or bathing? N  Doing errands, shopping? N  Preparing Food and eating ? N  Using the Toilet? N  In the past six months, have you accidently leaked urine? N  Do you have problems with loss of bowel control? N  Managing your Medications? N  Managing your Finances? N  Housekeeping or managing your Housekeeping? N  Some recent data might be hidden    Patient Care Team: [name], MD as PCP - General (Internal Medicine) Minus Breeding, MD as PCP - Cardiology (Cardiology)   Assessment:   This is a routine wellness examination for [place].  Exercise Activities and Dietary recommendations Current Exercise Habits: The patient has a physically strenuous job, but has no regular exercise apart from work., Exercise limited by: cardiac condition(s)  Goals    . Patient Stated     I want to take down the workout equipment from the garage and start to work-out routinely.        Fall Risk Fall Risk  12/09/2019 05/28/2019 11/23/2018 11/16/2017 05/15/2016  Falls in the past year? 0 0 0 No No  Number falls in past yr: 0 0 0 - -  Injury with Fall? 0 0 - - -  Risk for fall due to : No Fall Risks - - - -  Follow up Falls evaluation completed Falls evaluation completed - - -   Is the patient's home free of loose throw rugs in walkways, pet beds, electrical cords, etc?   no      Grab bars in the bathroom? yes      Handrails on the stairs?   yes      Adequate lighting?   yes  Timed Get Up and Go Performed: not  indicated  Depression Screen PHQ 2/9 Scores 12/09/2019 05/28/2019 11/23/2018 11/16/2017  PHQ - 2 Score 0 0 0 0    Cognitive Function     6CIT Screen 12/09/2019  What Year? 0 points  What month? 0 points  What time? 0 points  Count back from 20 0 points  Months in reverse 0 points  Repeat phrase 0 points  Total Score 0    Immunization History  Administered Date(s) Administered  . Fluad Quad(high Dose 65+) 05/28/2019  . Pneumococcal Conjugate-13 12/12/2013  . Pneumococcal Polysaccharide-23 05/11/2016  . Tdap 12/12/2013    Qualifies for Shingles Vaccine? Yes  Screening Tests Health Maintenance  Topic Date Due  . COVID-19 Vaccine (1) Never done  . HEMOGLOBIN A1C  11/26/2019  . OPHTHALMOLOGY EXAM  12/05/2019  . INFLUENZA VACCINE  01/26/2020  . FOOT EXAM  05/27/2020  . URINE MICROALBUMIN  05/27/2020  . TETANUS/TDAP  12/13/2023  . Hepatitis C Screening  Completed  . PNA vac Low Risk Adult  Completed   Cancer Screenings: Lung: Low Dose CT Chest recommended if Age 20-80 years, 30 pack-year currently smoking OR have quit w/in 15years. Patient does not qualify. Colorectal: Yes  Additional Screenings:  Hepatitis C Screening: yes      Plan:     Reviewed health maintenance screenings with patient today and relevant education, vaccines, and/or referrals were provided.    Continue doing brain stimulating activities (puzzles, reading, adult coloring books, staying active) to keep memory sharp.    Continue to eat heart healthy diet (full of fruits, vegetables, whole grains, lean protein, water--limit salt, fat, and sugar intake) and increase physical activity as tolerated.  I  have personally reviewed and noted the following in the patient's chart:   . Medical and social history . Use of alcohol, tobacco or illicit drugs  . Current medications and supplements . Functional ability and status . Nutritional status . Physical activity . Advanced directives . List of other  physicians . Hospitalizations, surgeries, and ER visits in previous 12 months . Vitals . Screenings to include cognitive, depression, and falls . Referrals and appointments  In addition, I have reviewed and discussed with patient certain preventive protocols, quality metrics, and best practice recommendations. A written personalized care plan for preventive services as well as general preventive health recommendations were provided to patient.     Sheral Flow, LPN  10/30/1831  Nurse Health Advisor

## 2019-12-09 NOTE — Patient Instructions (Signed)
Edward Pittman , Thank you for taking time to come for your Medicare Wellness Visit. I appreciate your ongoing commitment to your health goals. Please review the following plan we discussed and let me know if I can assist you in the future.   Screening recommendations/referrals: Colonoscopy: last done 05/06/2019 Recommended yearly ophthalmology/optometry visit for glaucoma screening and checkup Recommended yearly dental visit for hygiene and checkup  Vaccinations: Influenza vaccine: 05/28/2019 Pneumococcal vaccine: completed Tdap vaccine: 12/12/2013; due every 10 years Shingles vaccine: never done   Covid-19: completed  Advanced directives: Advance directive discussed with you today. Even though you declined this today please call our office should you change your mind and we can give you the proper paperwork for you to fill out.   Conditions/risks identified: Yes. Continue with personal lifestyle choices: daily care of teeth and gums, regular physical activity (150 minutes a week), eating a healthy diet, avoiding tobacco and drug use, limiting alcohol use, taking a low-dose aspirin (if not allergic) and taking vitamins and minerals as recommended by your physician.  Next appointment: Please schedule your 1 year Medicare Wellness Visit with your Health Coach.  Preventive Care 35 Years and Older, Male Preventive care refers to lifestyle choices and visits with your health care provider that can promote health and wellness. What does preventive care include?  A yearly physical exam. This is also called an annual well check.  Dental exams once or twice a year.  Routine eye exams. Ask your health care provider how often you should have your eyes checked.  Personal lifestyle choices, including:  Daily care of your teeth and gums.  Regular physical activity.  Eating a healthy diet.  Avoiding tobacco and drug use.  Limiting alcohol use.  Practicing safe sex.  Taking low doses of  aspirin every day.  Taking vitamin and mineral supplements as recommended by your health care provider. What happens during an annual well check? The services and screenings done by your health care provider during your annual well check will depend on your age, overall health, lifestyle risk factors, and family history of disease. Counseling  Your health care provider may ask you questions about your:  Alcohol use.  Tobacco use.  Drug use.  Emotional well-being.  Home and relationship well-being.  Sexual activity.  Eating habits.  History of falls.  Memory and ability to understand (cognition).  Work and work Statistician. Screening  You may have the following tests or measurements:  Height, weight, and BMI.  Blood pressure.  Lipid and cholesterol levels. These may be checked every 5 years, or more frequently if you are over 73 years old.  Skin check.  Lung cancer screening. You may have this screening every year starting at age 1 if you have a 30-pack-year history of smoking and currently smoke or have quit within the past 15 years.  Fecal occult blood test (FOBT) of the stool. You may have this test every year starting at age 7.  Flexible sigmoidoscopy or colonoscopy. You may have a sigmoidoscopy every 5 years or a colonoscopy every 10 years starting at age 71.  Prostate cancer screening. Recommendations will vary depending on your family history and other risks.  Hepatitis C blood test.  Hepatitis B blood test.  Sexually transmitted disease (STD) testing.  Diabetes screening. This is done by checking your blood sugar (glucose) after you have not eaten for a while (fasting). You may have this done every 1-3 years.  Abdominal aortic aneurysm (AAA) screening. You may need  this if you are a current or former smoker.  Osteoporosis. You may be screened starting at age 46 if you are at high risk. Talk with your health care provider about your test results,  treatment options, and if necessary, the need for more tests. Vaccines  Your health care provider may recommend certain vaccines, such as:  Influenza vaccine. This is recommended every year.  Tetanus, diphtheria, and acellular pertussis (Tdap, Td) vaccine. You may need a Td booster every 10 years.  Zoster vaccine. You may need this after age 83.  Pneumococcal 13-valent conjugate (PCV13) vaccine. One dose is recommended after age 69.  Pneumococcal polysaccharide (PPSV23) vaccine. One dose is recommended after age 52. Talk to your health care provider about which screenings and vaccines you need and how often you need them. This information is not intended to replace advice given to you by your health care provider. Make sure you discuss any questions you have with your health care provider. Document Released: 07/10/2015 Document Revised: 03/02/2016 Document Reviewed: 04/14/2015 Elsevier Interactive Patient Education  2017 [employer] Prevention in the Home Falls can cause injuries. They can happen to people of all ages. There are many things you can do to make your home safe and to help prevent falls. What can I do on the outside of my home?  Regularly fix the edges of walkways and driveways and fix any cracks.  Remove anything that might make you trip as you walk through a door, such as a raised step or threshold.  Trim any bushes or trees on the path to your home.  Use bright outdoor lighting.  Clear any walking paths of anything that might make someone trip, such as rocks or tools.  Regularly check to see if handrails are loose or broken. Make sure that both sides of any steps have handrails.  Any raised decks and porches should have guardrails on the edges.  Have any leaves, snow, or ice cleared regularly.  Use sand or salt on walking paths during winter.  Clean up any spills in your garage right away. This includes oil or grease spills. What can I do in the  bathroom?  Use night lights.  Install grab bars by the toilet and in the tub and shower. Do not use towel bars as grab bars.  Use non-skid mats or decals in the tub or shower.  If you need to sit down in the shower, use a plastic, non-slip stool.  Keep the floor dry. Clean up any water that spills on the floor as soon as it happens.  Remove soap buildup in the tub or shower regularly.  Attach bath mats securely with double-sided non-slip rug tape.  Do not have throw rugs and other things on the floor that can make you trip. What can I do in the bedroom?  Use night lights.  Make sure that you have a light by your bed that is easy to reach.  Do not use any sheets or blankets that are too big for your bed. They should not hang down onto the floor.  Have a firm chair that has side arms. You can use this for support while you get dressed.  Do not have throw rugs and other things on the floor that can make you trip. What can I do in the kitchen?  Clean up any spills right away.  Avoid walking on wet floors.  Keep items that you use a lot in easy-to-reach places.  If you  need to reach something above you, use a strong step stool that has a grab bar.  Keep electrical cords out of the way.  Do not use floor polish or wax that makes floors slippery. If you must use wax, use non-skid floor wax.  Do not have throw rugs and other things on the floor that can make you trip. What can I do with my stairs?  Do not leave any items on the stairs.  Make sure that there are handrails on both sides of the stairs and use them. Fix handrails that are broken or loose. Make sure that handrails are as long as the stairways.  Check any carpeting to make sure that it is firmly attached to the stairs. Fix any carpet that is loose or worn.  Avoid having throw rugs at the top or bottom of the stairs. If you do have throw rugs, attach them to the floor with carpet tape.  Make sure that you have a  light switch at the top of the stairs and the bottom of the stairs. If you do not have them, ask someone to add them for you. What else can I do to help prevent falls?  Wear shoes that:  Do not have high heels.  Have rubber bottoms.  Are comfortable and fit you well.  Are closed at the toe. Do not wear sandals.  If you use a stepladder:  Make sure that it is fully opened. Do not climb a closed stepladder.  Make sure that both sides of the stepladder are locked into place.  Ask someone to hold it for you, if possible.  Clearly mark and make sure that you can see:  Any grab bars or handrails.  First and last steps.  Where the edge of each step is.  Use tools that help you move around (mobility aids) if they are needed. These include:  Canes.  Walkers.  Scooters.  Crutches.  Turn on the lights when you go into a dark area. Replace any light bulbs as soon as they burn out.  Set up your furniture so you have a clear path. Avoid moving your furniture around.  If any of your floors are uneven, fix them.  If there are any pets around you, be aware of where they are.  Review your medicines with your doctor. Some medicines can make you feel dizzy. This can increase your chance of falling. Ask your doctor what other things that you can do to help prevent falls. This information is not intended to replace advice given to you by your health care provider. Make sure you discuss any questions you have with your health care provider. Document Released: 04/09/2009 Document Revised: 11/19/2015 Document Reviewed: 07/18/2014 Elsevier Interactive Patient Education  2017 [employer].

## 2019-12-28 ENCOUNTER — Other Ambulatory Visit: Payer: Self-pay | Admitting: Internal Medicine

## 2019-12-28 DIAGNOSIS — I4819 Other persistent atrial fibrillation: Secondary | ICD-10-CM

## 2019-12-28 DIAGNOSIS — R000 Tachycardia, unspecified: Secondary | ICD-10-CM

## 2020-01-24 ENCOUNTER — Other Ambulatory Visit: Payer: Self-pay | Admitting: Internal Medicine

## 2020-01-24 ENCOUNTER — Telehealth: Payer: Self-pay | Admitting: Internal Medicine

## 2020-01-24 DIAGNOSIS — R000 Tachycardia, unspecified: Secondary | ICD-10-CM

## 2020-01-24 DIAGNOSIS — I4819 Other persistent atrial fibrillation: Secondary | ICD-10-CM

## 2020-01-24 MED ORDER — DILTIAZEM HCL ER COATED BEADS 240 MG PO CP24: ORAL_CAPSULE | ORAL | 0 refills | Status: DC

## 2020-01-24 NOTE — Telephone Encounter (Signed)
Can you call pt for me and ask him to schedule an appointment?

## 2020-01-24 NOTE — Telephone Encounter (Signed)
Patient called this morning and scheduled for 02/04/2020. Does he need to be seen sooner?

## 2020-01-24 NOTE — Telephone Encounter (Addendum)
   1.Medication Requested:diltiazem (CARDIZEM CD) 240 MG 24 hr capsule  2. Pharmacy (Name, Street, [place]):[facility] [phone] - [place], [place]  3. On Med List: yes  4. Last Visit with PCP: 05/28/19  5. Next visit date with PCP: 02/04/20   Agent: Please be advised that RX refills may take up to 3 business days. We ask that you follow-up with your pharmacy.

## 2020-01-24 NOTE — Telephone Encounter (Signed)
Erx has been sent.   We will see him on the 10th of August

## 2020-02-04 ENCOUNTER — Other Ambulatory Visit: Payer: Self-pay

## 2020-02-04 ENCOUNTER — Encounter: Payer: Self-pay | Admitting: Internal Medicine

## 2020-02-04 ENCOUNTER — Ambulatory Visit (INDEPENDENT_AMBULATORY_CARE_PROVIDER_SITE_OTHER): Payer: Medicare HMO | Admitting: Internal Medicine

## 2020-02-04 VITALS — BP 142/86 | HR 84 | Temp 98.7°F | Ht 73.0 in | Wt 243.1 lb

## 2020-02-04 DIAGNOSIS — I10 Essential (primary) hypertension: Secondary | ICD-10-CM | POA: Diagnosis not present

## 2020-02-04 DIAGNOSIS — E118 Type 2 diabetes mellitus with unspecified complications: Secondary | ICD-10-CM | POA: Diagnosis not present

## 2020-02-04 NOTE — Progress Notes (Signed)
Subjective:  Patient ID: Edward Pittman, male    DOB: Feb 18, 1941  Age: 79 y.o. MRN: [id number]  CC: Hypertension and Diabetes  This visit occurred during the SARS-CoV-2 public health emergency.  Safety protocols were in place, including screening questions prior to the visit, additional usage of staff PPE, and extensive cleaning of exam room while observing appropriate contact time as indicated for disinfecting solutions.    HPI Mays Paino Pischke presents for f/up - He tells me his blood pressure and blood sugars have been well controlled.  He is active and denies any recent episodes of chest pain, shortness of breath, palpitations, edema, or fatigue.  He is not taking xigduo because it caused nausea.  He denies polys.  Outpatient Medications Prior to Visit  Medication Sig Dispense Refill  . diltiazem (CARDIZEM CD) 240 MG 24 hr capsule TAKE 1 CAPSULE(240 MG) BY MOUTH DAILY 90 capsule 0  . latanoprost (XALATAN) 0.005 % ophthalmic solution INT 1 GTT IN OU QD IN THE EVE    . metoprolol tartrate (LOPRESSOR) 100 MG tablet TAKE 1 TABLET BY MOUTH TWICE DAILY KEEP UPCOMING APPOINTMENT 30 tablet 10  . rivaroxaban (XARELTO) 20 MG TABS tablet Take 1 tablet (20 mg total) by mouth daily with supper. 90 tablet 3  . simvastatin (ZOCOR) 40 MG tablet TAKE 1 TABLET(40 MG) BY MOUTH DAILY AT 6 PM 90 tablet 3  . Dapagliflozin-metFORMIN HCl ER (XIGDUO XR) 10-998 MG TB24 Take 1 tablet by mouth daily. 90 tablet 0  . nitroGLYCERIN (NITRODUR - DOSED IN MG/24 HR) 0.1 mg/hr patch PLACE 1 PATCH TOPICALLY ONTO THE SKIN DAILY (Patient not taking: Reported on 12/09/2019) 90 patch 1   No facility-administered medications prior to visit.    ROS Review of Systems  Constitutional: Negative.  Negative for appetite change, diaphoresis, fatigue and unexpected weight change.  HENT: Negative.  Negative for trouble swallowing.   Eyes: Negative for visual disturbance.  Respiratory: Negative for cough, chest tightness, shortness  of breath and wheezing.   Cardiovascular: Negative for chest pain, palpitations and leg swelling.  Gastrointestinal: Negative for abdominal pain, constipation, diarrhea, nausea and vomiting.  Endocrine: Negative.   Genitourinary: Negative.  Negative for difficulty urinating.  Musculoskeletal: Negative for arthralgias and myalgias.  Skin: Negative.  Negative for color change and pallor.  Neurological: Negative.  Negative for dizziness and weakness.  Hematological: Negative for adenopathy. Does not bruise/bleed easily.  Psychiatric/Behavioral: Negative.     Objective:  BP (!) 142/86 (BP Location: Left Arm, Patient Position: Sitting, Cuff Size: Large)   Pulse 84   Temp 98.7 F (37.1 C) (Oral)   Ht 6\' 1"  (1.854 m)   Wt 243 lb 2 oz (110.3 kg)   SpO2 97%   BMI 32.08 kg/m   BP Readings from Last 3 Encounters:  02/04/20 (!) 142/86  12/09/19 140/80  06/03/19 (!) 152/86    Wt Readings from Last 3 Encounters:  02/04/20 243 lb 2 oz (110.3 kg)  12/09/19 241 lb 9.6 oz (109.6 kg)  06/04/19 242 lb (109.8 kg)    Physical Exam Vitals reviewed.  Constitutional:      Appearance: Normal appearance.  HENT:     Nose: Nose normal.     Mouth/Throat:     Mouth: Mucous membranes are moist.  Eyes:     General: No scleral icterus.    Conjunctiva/sclera: Conjunctivae normal.  Cardiovascular:     Rate and Rhythm: Normal rate and regular rhythm.     Heart sounds: Normal heart  sounds. No murmur heard.   Pulmonary:     Effort: Pulmonary effort is normal. No respiratory distress.     Breath sounds: No stridor. No wheezing, rhonchi or rales.  Abdominal:     General: Abdomen is flat. Bowel sounds are normal. There is no distension.     Palpations: Abdomen is soft. There is no hepatomegaly, splenomegaly or mass.     Tenderness: There is no abdominal tenderness.  Musculoskeletal:        General: Normal range of motion.     Cervical back: Neck supple.     Right lower leg: No edema.     Left  lower leg: No edema.  Lymphadenopathy:     Cervical: No cervical adenopathy.  Skin:    General: Skin is warm.  Neurological:     General: No focal deficit present.     Mental Status: He is alert and oriented to person, place, and time. Mental status is at baseline.     Lab Results  Component Value Date   WBC 6.4 05/28/2019   HGB 15.6 05/28/2019   HCT 46.0 05/28/2019   PLT 183.0 05/28/2019   GLUCOSE 131 (H) 02/04/2020   CHOL 135 05/28/2019   TRIG 55.0 05/28/2019   HDL 33.20 (L) 05/28/2019   LDLCALC 91 05/28/2019   ALT 30 05/28/2019   AST 35 05/28/2019   NA 138 02/04/2020   K 4.1 02/04/2020   CL 104 02/04/2020   CREATININE 0.94 02/04/2020   BUN 11 02/04/2020   CO2 26 02/04/2020   TSH 2.44 05/28/2019   PSA 0.67 05/28/2019   INR 2.8 08/03/2012   HGBA1C 7.2 (H) 02/04/2020   MICROALBUR 2.3 (H) 05/28/2019    Myocardial Perfusion Imaging  Result Date: 06/04/2019  There was no ST segment deviation noted during stress.  No evidence of ischemia on stress imaging  Small fixed perfusion abnormality in the apical septum may be secondary to attenuation from body habitus.    Assessment & Plan:   Robet was seen today for hypertension and diabetes.  Diagnoses and all orders for this visit:  Essential hypertension- His blood pressure is adequately well controlled.  Electrolytes and renal function are normal. -     BASIC METABOLIC PANEL WITH GFR; Future -     BASIC METABOLIC PANEL WITH GFR  Type 2 diabetes mellitus with complication, without long-term current use of insulin ([place])- I think the Metformin caused the nausea so I have asked him to stop taking xigduo.  His A1c is at 7.2%.  I have asked him to stay on the SGLT2 inhibitor. -     BASIC METABOLIC PANEL WITH GFR; Future -     Hemoglobin A1c; Future -     Hemoglobin A1c -     BASIC METABOLIC PANEL WITH GFR -     dapagliflozin propanediol (FARXIGA) 10 MG TABS tablet; Take 1 tablet (10 mg total) by mouth daily before  breakfast.   I have discontinued Tasha H. Fossett's Xigduo XR and nitroGLYCERIN. I am also having him start on dapagliflozin propanediol. Additionally, I am having him maintain his latanoprost, metoprolol tartrate, simvastatin, rivaroxaban, and diltiazem.  Meds ordered this encounter  Medications  . dapagliflozin propanediol (FARXIGA) 10 MG TABS tablet    Sig: Take 1 tablet (10 mg total) by mouth daily before breakfast.    Dispense:  90 tablet    Refill:  1     Follow-up: Return in about 6 months (around 08/06/2020).  Scarlette Calico, MD

## 2020-02-04 NOTE — Patient Instructions (Signed)
Type 2 Diabetes Mellitus, Diagnosis, Adult Type 2 diabetes (type 2 diabetes mellitus) is a long-term (chronic) disease. In type 2 diabetes, one or both of these problems may be present:  The pancreas does not make enough of a hormone called insulin.  Cells in the body do not respond properly to insulin that the body makes (insulin resistance). Normally, insulin allows blood sugar (glucose) to enter cells in the body. The cells use glucose for energy. Insulin resistance or lack of insulin causes excess glucose to build up in the blood instead of going into cells. As a result, high blood glucose (hyperglycemia) develops. What increases the risk? The following factors may make you more likely to develop type 2 diabetes:  Having a family member with type 2 diabetes.  Being overweight or obese.  Having an inactive (sedentary) lifestyle.  Having been diagnosed with insulin resistance.  Having a history of prediabetes, gestational diabetes, or polycystic ovary syndrome (PCOS).  Being of American-Indian, African-American, Hispanic/Latino, or Asian/Pacific Islander descent. What are the signs or symptoms? In the early stage of this condition, you may not have symptoms. Symptoms develop slowly and may include:  Increased thirst (polydipsia).  Increased hunger(polyphagia).  Increased urination (polyuria).  Increased urination during the night (nocturia).  Unexplained weight loss.  Frequent infections that keep coming back (recurring).  Fatigue.  Weakness.  Vision changes, such as blurry vision.  Cuts or bruises that are slow to heal.  Tingling or numbness in the hands or feet.  Dark patches on the skin (acanthosis nigricans). How is this diagnosed? This condition is diagnosed based on your symptoms, your medical history, a physical exam, and your blood glucose level. Your blood glucose may be checked with one or more of the following blood tests:  A fasting blood glucose (FBG)  test. You will not be allowed to eat (you will fast) for 8 hours or longer before a blood sample is taken.  A random blood glucose test. This test checks blood glucose at any time of day regardless of when you ate.  An A1c (hemoglobin A1c) blood test. This test provides information about blood glucose control over the previous 2-3 months.  An oral glucose tolerance test (OGTT). This test measures your blood glucose at two times: ? After fasting. This is your baseline blood glucose level. ? Two hours after drinking a beverage that contains glucose. You may be diagnosed with type 2 diabetes if:  Your FBG level is 126 mg/dL (7.0 mmol/L) or higher.  Your random blood glucose level is 200 mg/dL (11.1 mmol/L) or higher.  Your A1c level is 6.5% or higher.  Your OGTT result is higher than 200 mg/dL (11.1 mmol/L). These blood tests may be repeated to confirm your diagnosis. How is this treated? Your treatment may be managed by a specialist called an endocrinologist. Type 2 diabetes may be treated by following instructions from your health care provider about:  Making diet and lifestyle changes. This may include: ? Following an individualized nutrition plan that is developed by a diet and nutrition specialist (registered dietitian). ? Exercising regularly. ? Finding ways to manage stress.  Checking your blood glucose level as often as told.  Taking diabetes medicines or insulin daily. This helps to keep your blood glucose levels in the healthy range. ? If you use insulin, you may need to adjust the dosage depending on how physically active you are and what foods you eat. Your health care provider will tell you how to adjust your dosage.    Taking medicines to help prevent complications from diabetes, such as: ? Aspirin. ? Medicine to lower cholesterol. ? Medicine to control blood pressure. Your health care provider will set individualized treatment goals for you. Your goals will be based on  your age, other medical conditions you have, and how you respond to diabetes treatment. Generally, the goal of treatment is to maintain the following blood glucose levels:  Before meals (preprandial): 80-130 mg/dL (4.4-7.2 mmol/L).  After meals (postprandial): below 180 mg/dL (10 mmol/L).  A1c level: less than 7%. Follow these instructions at home: Questions to ask your health care provider  Consider asking the following questions: ? Do I need to meet with a diabetes educator? ? Where can I find a support group for people with diabetes? ? What equipment will I need to manage my diabetes at home? ? What diabetes medicines do I need, and when should I take them? ? How often do I need to check my blood glucose? ? What number can I call if I have questions? ? When is my next appointment? General instructions  Take over-the-counter and prescription medicines only as told by your health care provider.  Keep all follow-up visits as told by your health care provider. This is important.  For more information about diabetes, visit: ? American Diabetes Association (ADA): www.diabetes.org ? American Association of Diabetes Educators (AADE): www.diabeteseducator.org Contact a health care provider if:  Your blood glucose is at or above 240 mg/dL (13.3 mmol/L) for 2 days in a row.  You have been sick or have had a fever for 2 days or longer, and you are not getting better.  You have any of the following problems for more than 6 hours: ? You cannot eat or drink. ? You have nausea and vomiting. ? You have diarrhea. Get help right away if:  Your blood glucose is lower than 54 mg/dL (3.0 mmol/L).  You become confused or you have trouble thinking clearly.  You have difficulty breathing.  You have moderate or large ketone levels in your urine. Summary  Type 2 diabetes (type 2 diabetes mellitus) is a long-term (chronic) disease. In type 2 diabetes, the pancreas does not make enough of a  hormone called insulin, or cells in the body do not respond properly to insulin that the body makes (insulin resistance).  This condition is treated by making diet and lifestyle changes and taking diabetes medicines or insulin.  Your health care provider will set individualized treatment goals for you. Your goals will be based on your age, other medical conditions you have, and how you respond to diabetes treatment.  Keep all follow-up visits as told by your health care provider. This is important. This information is not intended to replace advice given to you by your health care provider. Make sure you discuss any questions you have with your health care provider. Document Revised: 08/11/2017 Document Reviewed: 07/17/2015 Elsevier Patient Education  2020 [employer]  

## 2020-02-05 LAB — HEMOGLOBIN A1C
Hgb A1c MFr Bld: 7.2 % of total Hgb — ABNORMAL HIGH (ref <5.7)
Mean Plasma Glucose: 160 (calc)
eAG (mmol/L): 8.9 (calc)

## 2020-02-05 LAB — BASIC METABOLIC PANEL WITH GFR
BUN: 11 mg/dL (ref 7–25)
CO2: 26 mmol/L (ref 20–32)
Calcium: 9.5 mg/dL (ref 8.6–10.3)
Chloride: 104 mmol/L (ref 98–110)
Creat: 0.94 mg/dL (ref 0.70–1.18)
GFR, Est African American: 90 mL/min/{1.73_m2} (ref >=60)
GFR, Est Non African American: 78 mL/min/{1.73_m2} (ref >=60)
Glucose, Bld: 131 mg/dL — ABNORMAL HIGH (ref 65–99)
Potassium: 4.1 mmol/L (ref 3.5–5.3)
Sodium: 138 mmol/L (ref 135–146)

## 2020-02-05 MED ORDER — DAPAGLIFLOZIN PROPANEDIOL 10 MG PO TABS: 10.0000 mg | ORAL_TABLET | Freq: Every day | ORAL | 1 refills | Status: DC

## 2020-02-19 ENCOUNTER — Other Ambulatory Visit: Payer: Self-pay | Admitting: Cardiology

## 2020-02-19 NOTE — Telephone Encounter (Signed)
*  STAT* If patient is at the pharmacy, call can be transferred to refill team.   1. Which medications need to be refilled? (please list name of each medication and dose if known)  metoprolol tartrate (LOPRESSOR) 100 MG tablet   2. Which pharmacy/location (including street and city if local pharmacy) is medication to be sent to? [facility] [phone] - [place], [place]  3. Do they need a 30 day or 90 day supply? 30 with refills   Pt is out of medication

## 2020-02-20 MED ORDER — METOPROLOL TARTRATE 100 MG PO TABS: 100.0000 mg | ORAL_TABLET | Freq: Two times a day (BID) | ORAL | 1 refills | Status: DC

## 2020-02-20 NOTE — Telephone Encounter (Signed)
Rx has been sent to the pharmacy electronically. ° °

## 2020-03-30 DIAGNOSIS — H401132 Primary open-angle glaucoma, bilateral, moderate stage: Secondary | ICD-10-CM | POA: Diagnosis not present

## 2020-03-30 LAB — HM DIABETES EYE EXAM

## 2020-04-24 ENCOUNTER — Other Ambulatory Visit: Payer: Self-pay | Admitting: Internal Medicine

## 2020-04-24 DIAGNOSIS — I4819 Other persistent atrial fibrillation: Secondary | ICD-10-CM

## 2020-04-24 DIAGNOSIS — R000 Tachycardia, unspecified: Secondary | ICD-10-CM

## 2020-05-17 ENCOUNTER — Other Ambulatory Visit: Payer: Self-pay | Admitting: Cardiology

## 2020-06-15 ENCOUNTER — Telehealth: Payer: Self-pay | Admitting: Internal Medicine

## 2020-06-15 DIAGNOSIS — E118 Type 2 diabetes mellitus with unspecified complications: Secondary | ICD-10-CM

## 2020-06-15 NOTE — Progress Notes (Signed)
  Chronic Care Management   Note  06/15/2020 Name: Elie Leppo Scavone MRN: [id number] DOB: Feb 20, 1941  Desiree Lucy Cryderman is a 79 y.o. year old male who is a primary care patient of [name], MD. I reached out to [place] by phone today in response to a referral sent by Mr. Ruhaan Nordahl Spielberg's PCP, [name], MD.   Mr. San was given information about Chronic Care Management services today including:  1. CCM service includes personalized support from designated clinical staff supervised by his physician, including individualized plan of care and coordination with other care providers 2. 24/7 contact phone numbers for assistance for urgent and routine care needs. 3. Service will only be billed when office clinical staff spend 20 minutes or more in a month to coordinate care. 4. Only one practitioner may furnish and bill the service in a calendar month. 5. The patient may stop CCM services at any time (effective at the end of the month) by phone call to the office staff.   Patient agreed to services and verbal consent obtained.   Follow up plan:   Carley Perdue [employer] Scheduler

## 2020-08-10 ENCOUNTER — Other Ambulatory Visit: Payer: Self-pay | Admitting: Cardiology

## 2020-08-10 DIAGNOSIS — E785 Hyperlipidemia, unspecified: Secondary | ICD-10-CM

## 2020-08-11 ENCOUNTER — Telehealth: Payer: Self-pay | Admitting: Pharmacist

## 2020-08-11 NOTE — Progress Notes (Signed)
    Chronic Care Management Pharmacy Assistant   Name: Edward Pittman  MRN: [id number] DOB: 1941/03/28  Reason for Encounter: Initial Questions   PCP : [name], MD  Allergies:   Allergies  Allergen Reactions  . Benazepril Cough  . Metformin And Related Nausea Only    Medications: Outpatient Encounter Medications as of 08/11/2020  Medication Sig  . dapagliflozin propanediol (FARXIGA) 10 MG TABS tablet Take 1 tablet (10 mg total) by mouth daily before breakfast.  . diltiazem (CARDIZEM CD) 240 MG 24 hr capsule TAKE 1 CAPSULE(240 MG) BY MOUTH DAILY  . latanoprost (XALATAN) 0.005 % ophthalmic solution INT 1 GTT IN OU QD IN THE EVE  . metoprolol tartrate (LOPRESSOR) 100 MG tablet TAKE 1 TABLET(100 MG) BY MOUTH TWICE DAILY  . rivaroxaban (XARELTO) 20 MG TABS tablet Take 1 tablet (20 mg total) by mouth daily with supper.  . simvastatin (ZOCOR) 40 MG tablet TAKE 1 TABLET(40 MG) BY MOUTH DAILY AT 6 PM   No facility-administered encounter medications on file as of 08/11/2020.    Current Diagnosis: Patient Active Problem List   Diagnosis Date Noted  . Chronic atrial fibrillation ([place]) 06/01/2019  . Dyslipidemia 06/01/2019  . Stable angina pectoris ([place]) 05/28/2019  . Type 2 diabetes mellitus with complication, without long-term current use of insulin ([place]) 11/16/2017  . BPH (benign prostatic hyperplasia) 12/12/2013  . Routine general medical examination at a health care facility 12/12/2013  . Cardiomyopathy, nonischemic ([place]) 08/09/2011  . Hyperlipidemia with target LDL less than 100 07/14/2007  . MITRAL REGURGITATION, MILD 07/14/2007  . Essential hypertension 07/14/2007  . Persistent atrial fibrillation ([place]) 07/14/2007  . DEGENERATIVE JOINT DISEASE, HIPS 07/14/2007    Goals Addressed   None     Follow-Up:  Pharmacist Review   Have you seen any other providers since your last visit? The patient states that he has not seen any other provider since Dr. Ronnald Ramp  Any  changes in your medications or health? The patient states that he has not had any changes in his medication or health  Any side effects from any medications? The patient states that he does not have any side effects from medications  Do you have an symptoms or problems not managed by your medications? The patient states that he does not have any symptoms or problems not managed by medications  Any concerns about your health right now? The patient states that he does not have any concerns about his health at this time  Has your provider asked that you check blood pressure, blood sugar, or follow special diet at home? The patient states his blood pressure is good and that he tries to watch what he eats  Do you get any type of exercise on a regular basis? The patient stated that he is active everyday at work   Can you think of a goal you would like to reach for your health? The patient states that he would like to lose a few pounds  Do you have any problems getting your medications? The patient states he has no problems with his pharmacy  Is there anything that you would like to discuss during the appointment? The patient states that he does not have anything specific to discuss  Patient asked for phone call on his cell phone   Please bring medications and supplements to appointment    Wendy Poet, [place] [phone]  Total time:16

## 2020-08-13 ENCOUNTER — Ambulatory Visit (INDEPENDENT_AMBULATORY_CARE_PROVIDER_SITE_OTHER): Payer: Medicare HMO | Admitting: Pharmacist

## 2020-08-13 ENCOUNTER — Other Ambulatory Visit: Payer: Self-pay

## 2020-08-13 DIAGNOSIS — E118 Type 2 diabetes mellitus with unspecified complications: Secondary | ICD-10-CM

## 2020-08-13 DIAGNOSIS — I10 Essential (primary) hypertension: Secondary | ICD-10-CM

## 2020-08-13 DIAGNOSIS — E785 Hyperlipidemia, unspecified: Secondary | ICD-10-CM

## 2020-08-13 DIAGNOSIS — I4819 Other persistent atrial fibrillation: Secondary | ICD-10-CM | POA: Diagnosis not present

## 2020-08-13 NOTE — Patient Instructions (Addendum)
Visit Information  Phone number for Pharmacist: [phone]  Thank you for meeting with me to discuss your medications! I look forward to working with you to achieve your health care goals. Below is a summary of what we talked about during the visit:  Goals Addressed            This Visit's Progress   . Manage My Medicine       Timeframe:  Long-Range Goal Priority:  Medium Start Date:      08/13/20                       Expected End Date:    08/13/21                   Follow Up Date 02/24/21    - call for medicine refill 2 or 3 days before it runs out - call if I am sick and can't take my medicine - keep a list of all the medicines I take; vitamins and herbals too - use a pillbox to sort medicine  -When Xarelto cost increases during donut hole, call [facility] (888-XARELTO) to get medication mailed to you for $85/month   Why is this important?   . These steps will help you keep on track with your medicines.   Notes:       Patient Care Plan: CCM Pharmacy Care Plan    Problem Identified: Hypertension, Hyperlipidemia, Diabetes, Atrial Fibrillation and Glaucoma   Priority: High    Long-Range Goal: Disease management   Start Date: 08/13/2020  Expected End Date: 08/13/2021  This Visit's Progress: On track  Priority: High  Note:   Current Barriers:  . Unable to independently afford treatment regimen . Unable to independently monitor therapeutic efficacy  Pharmacist Clinical Goal(s):  Marland Kitchen Over the next 180 days, patient will verbalize ability to afford treatment regimen . achieve adherence to monitoring guidelines and medication adherence to achieve therapeutic efficacy through collaboration with PharmD and provider.   Interventions: . 1:1 collaboration with [name], MD regarding development and update of comprehensive plan of care as evidenced by provider attestation and co-signature . Inter-disciplinary care team collaboration (see longitudinal plan of  care) . Comprehensive medication review performed; medication list updated in electronic medical record  Hypertension (BP goal <140/90) -controlled - per patient report of home BP -Current treatment: . Diltiazem CD 240 mg daily . Metoprolol tartrate 100 mg BID -Medications previously tried: benazepril, furosemide,  -Current home readings: unknown, however patient's granddaughter is a Marine scientist and checks occasionally, according to her his BP is "good" -Denies hypotensive/hypertensive symptoms -Educated on BP goals and benefits of medications for prevention of heart attack, stroke and kidney damage; Daily salt intake goal < 2300 mg; Importance of home blood pressure monitoring; -Counseled to monitor BP at home weekly, document, and provide log at future appointments -Counseled on diet and exercise extensively Recommended to continue current medication  Hyperlipidemia: (LDL goal < 100) -controlled -Current treatment: . Simvastatin 40 mg daily -Current dietary patterns: drinks lemon-water -Current exercise habits: walks on track several times per week -Educated on Cholesterol goals;  Benefits of statin for ASCVD risk reduction; -Counseled on diet and exercise extensively Recommended to continue current medication  Diabetes (A1c goal <7%) -uncontrolled - last A1c was off of medication -Current medications: . Farxiga 10 mg daily -Medications previously tried: Xigduo  -Current home glucose readings: not checking, not indicatedDenies hypoglycemic/hyperglycemic symptoms -Educated onA1c and blood sugar goals; Exercise goal  of 150 minutes per week; -Counseled to check feet daily and get yearly eye exams -Counseled on diet and exercise extensively Recommended to continue current medication  Atrial Fibrillation (Goal: prevent stroke and major bleeding) -controlled  -CHADSVASC: 5 -Current treatment: . Rate control: Diltiazem CD 240 mg, metoprolol tartrate 100 mg BID . Anticoagulation:  Xarelto 20 mg daily -Medications previously tried: Tikosyn -Counseled on increased risk of stroke due to Afib and benefits of anticoagulation for stroke prevention; importance of adherence to anticoagulant exactly as prescribed; bleeding risk associated with Xarelto and importance of self-monitoring for signs/symptoms of bleeding; -Recommended to continue current medication Assessed patient finances. Gave number for [employer] for cost savings on Xarelto during donut hole  Glaucoma (Goal: prevent progression) -controlled -Current treatment  . Latanoprost 0.005% eye drops -Patient is satisfied with current regimen and denies issues -Recommended to continue current medication  Health Maintenance -Vaccine gaps: Flu, Covid booster -Current therapy:  . Omega-3 fish oil -Counseled on bleeding risk with fish oil and Xarelto; of note patient's triglcyerides are great -Recommended to stop omega-3 supplement due to unnecessary bleeding risk  -Recommend to schedule 93-month PCP follow up visit  Patient Goals/Self-Care Activities . Over the next 180 days, patient will:  - take medications as prescribed focus on medication adherence by pill box check blood pressure weekly, document, and provide at future appointments target a minimum of 150 minutes of moderate intensity exercise weekly  Follow Up Plan: Telephone follow up appointment with care management team member scheduled for: 6 months      Mr. Billups was given information about Chronic Care Management services today including:  1. CCM service includes personalized support from designated clinical staff supervised by his physician, including individualized plan of care and coordination with other care providers 2. 24/7 contact phone numbers for assistance for urgent and routine care needs. 3. Standard insurance, coinsurance, copays and deductibles apply for chronic care management only during months in which we provide at least 20  minutes of these services. Most insurances cover these services at 100%, however patients may be responsible for any copay, coinsurance and/or deductible if applicable. This service may help you avoid the need for more expensive face-to-face services. 4. Only one practitioner may furnish and bill the service in a calendar month. 5. The patient may stop CCM services at any time (effective at the end of the month) by phone call to the office staff.  Patient agreed to services and verbal consent obtained.   The patient verbalized understanding of instructions, educational materials, and care plan provided today and agreed to receive a mailed copy of patient instructions, educational materials, and care plan.  Telephone follow up appointment with pharmacy team member scheduled for: 6 months  Charlene Brooke, PharmD, BCACP Clinical Pharmacist [facility] [phone]  Heart-Healthy Eating Plan Many factors influence your heart (coronary) health, including eating and exercise habits. Coronary risk increases with abnormal blood fat (lipid) levels. Heart-healthy meal planning includes limiting unhealthy fats, increasing healthy fats, and making other diet and lifestyle changes. What is my plan? Your health care provider may recommend that you:  Limit your fat intake to _________% or less of your total calories each day.  Limit your saturated fat intake to _________% or less of your total calories each day.  Limit the amount of cholesterol in your diet to less than _________ mg per day. What are tips for following this plan? Cooking Cook foods using methods other than frying. Baking, boiling,  grilling, and broiling are all good options. Other ways to reduce fat include:  Removing the skin from poultry.  Removing all visible fats from meats.  Steaming vegetables in water or broth. Meal planning  At meals, imagine dividing your plate into fourths: ? Fill one-half  of your plate with vegetables and green salads. ? Fill one-fourth of your plate with whole grains. ? Fill one-fourth of your plate with lean protein foods.  Eat 4-5 servings of vegetables per day. One serving equals 1 cup raw or cooked vegetable, or 2 cups raw leafy greens.  Eat 4-5 servings of fruit per day. One serving equals 1 medium whole fruit,  cup dried fruit,  cup fresh, frozen, or canned fruit, or  cup 100% fruit juice.  Eat more foods that contain soluble fiber. Examples include apples, broccoli, carrots, beans, peas, and barley. Aim to get 25-30 g of fiber per day.  Increase your consumption of legumes, nuts, and seeds to 4-5 servings per week. One serving of dried beans or legumes equals  cup cooked, 1 serving of nuts is  cup, and 1 serving of seeds equals 1 tablespoon.   Fats  Choose healthy fats more often. Choose monounsaturated and polyunsaturated fats, such as olive and canola oils, flaxseeds, walnuts, almonds, and seeds.  Eat more omega-3 fats. Choose salmon, mackerel, sardines, tuna, flaxseed oil, and ground flaxseeds. Aim to eat fish at least 2 times each week.  Check food labels carefully to identify foods with trans fats or high amounts of saturated fat.  Limit saturated fats. These are found in animal products, such as meats, butter, and cream. Plant sources of saturated fats include palm oil, palm kernel oil, and coconut oil.  Avoid foods with partially hydrogenated oils in them. These contain trans fats. Examples are stick margarine, some tub margarines, cookies, crackers, and other baked goods.  Avoid fried foods. General information  Eat more home-cooked food and less restaurant, buffet, and fast food.  Limit or avoid alcohol.  Limit foods that are high in starch and sugar.  Lose weight if you are overweight. Losing just 5-10% of your body weight can help your overall health and prevent diseases such as diabetes and heart disease.  Monitor your salt  (sodium) intake, especially if you have high blood pressure. Talk with your health care provider about your sodium intake.  Try to incorporate more vegetarian meals weekly. What foods can I eat? Fruits All fresh, canned (in natural juice), or frozen fruits. Vegetables Fresh or frozen vegetables (raw, steamed, roasted, or grilled). Green salads. Grains Most grains. Choose whole wheat and whole grains most of the time. Rice and pasta, including brown rice and pastas made with whole wheat. Meats and other proteins Lean, well-trimmed beef, veal, pork, and lamb. Chicken and [place] without skin. All fish and shellfish. Wild duck, rabbit, pheasant, and venison. Egg whites or low-cholesterol egg substitutes. Dried beans, peas, lentils, and tofu. Seeds and most nuts. Dairy Low-fat or nonfat cheeses, including ricotta and mozzarella. Skim or 1% milk (liquid, powdered, or evaporated). Buttermilk made with low-fat milk. Nonfat or low-fat yogurt. Fats and oils Non-hydrogenated (trans-free) margarines. Vegetable oils, including soybean, sesame, sunflower, olive, peanut, safflower, corn, canola, and cottonseed. Salad dressings or mayonnaise made with a vegetable oil. Beverages Water (mineral or sparkling). Coffee and tea. Diet carbonated beverages. Sweets and desserts Sherbet, gelatin, and fruit ice. Small amounts of dark chocolate. Limit all sweets and desserts. Seasonings and condiments All seasonings and condiments. The items listed above  may not be a complete list of foods and beverages you can eat. Contact a dietitian for more options. What foods are not recommended? Fruits Canned fruit in heavy syrup. Fruit in cream or butter sauce. Fried fruit. Limit coconut. Vegetables Vegetables cooked in cheese, cream, or butter sauce. Fried vegetables. Grains Breads made with saturated or trans fats, oils, or whole milk. Croissants. Sweet rolls. Donuts. High-fat crackers, such as cheese crackers. Meats and  other proteins Fatty meats, such as hot dogs, ribs, sausage, bacon, rib-eye roast or steak. High-fat deli meats, such as salami and bologna. Caviar. Domestic duck and goose. Organ meats, such as liver. Dairy Cream, sour cream, cream cheese, and creamed cottage cheese. Whole milk cheeses. Whole or 2% milk (liquid, evaporated, or condensed). Whole buttermilk. Cream sauce or high-fat cheese sauce. Whole-milk yogurt. Fats and oils Meat fat, or shortening. Cocoa butter, hydrogenated oils, palm oil, coconut oil, palm kernel oil. Solid fats and shortenings, including bacon fat, salt pork, lard, and butter. Nondairy cream substitutes. Salad dressings with cheese or sour cream. Beverages Regular sodas and any drinks with added sugar. Sweets and desserts Frosting. Pudding. Cookies. Cakes. Pies. Milk chocolate or white chocolate. Buttered syrups. Full-fat ice cream or ice cream drinks. The items listed above may not be a complete list of foods and beverages to avoid. Contact a dietitian for more information. Summary  Heart-healthy meal planning includes limiting unhealthy fats, increasing healthy fats, and making other diet and lifestyle changes.  Lose weight if you are overweight. Losing just 5-10% of your body weight can help your overall health and prevent diseases such as diabetes and heart disease.  Focus on eating a balance of foods, including fruits and vegetables, low-fat or nonfat dairy, lean protein, nuts and legumes, whole grains, and heart-healthy oils and fats. This information is not intended to replace advice given to you by your health care provider. Make sure you discuss any questions you have with your health care provider. Document Revised: 07/21/2017 Document Reviewed: 07/21/2017 Elsevier Patient Education  2021 [employer].

## 2020-08-13 NOTE — Progress Notes (Signed)
Chronic Care Management Pharmacy Note  08/13/2020 Name:  Edward Pittman MRN:  [id number] DOB:  April 21, 1941  Subjective: Edward Pittman is an 81 y.o. year old male who is a primary patient of [name], MD.  The CCM team was consulted for assistance with disease management and care coordination needs.    Engaged with patient by telephone for initial visit in response to provider referral for pharmacy case management and/or care coordination services.   Consent to Services:  The patient was given the following information about Chronic Care Management services today, agreed to services, and gave verbal consent: 1. CCM service includes personalized support from designated clinical staff supervised by the primary care provider, including individualized plan of care and coordination with other care providers 2. 24/7 contact phone numbers for assistance for urgent and routine care needs. 3. Service will only be billed when office clinical staff spend 20 minutes or more in a month to coordinate care. 4. Only one practitioner may furnish and bill the service in a calendar month. 5.The patient may stop CCM services at any time (effective at the end of the month) by phone call to the office staff. 6. The patient will be responsible for cost sharing (co-pay) of up to 20% of the service fee (after annual deductible is met). Patient agreed to services and consent obtained.  Patient Care Team: [name], MD as PCP - General (Internal Medicine) Minus Breeding, MD as PCP - [facility] ([facility] (Ophthalmology) Charlton Haws, [facility] as Pharmacist (Pharmacist)  Patient walks on track several times per week.  He work as Psychologist, prison and probation services.  Recent office visits: 02/04/20 Dr Ronnald Ramp OV: chronic f/u. Not taking Xigduo due to nausea. Changed to [place].  Recent consult visits: 03/30/20 Dr Alanda Slim (ophthalmology): f/u glaucoma  06/03/19 Dr Percival Spanish (cardiology): f/u  Afib. Advised to monitor BP at home. No med changes.  Hospital visits: None in previous 6 months  Objective:  Lab Results  Component Value Date   CREATININE 0.94 02/04/2020   BUN 11 02/04/2020   GFR 105.94 05/28/2019   GFRNONAA 78 02/04/2020   GFRAA 90 02/04/2020   NA 138 02/04/2020   K 4.1 02/04/2020   CALCIUM 9.5 02/04/2020   CO2 26 02/04/2020    Lab Results  Component Value Date/Time   HGBA1C 7.2 (H) 02/04/2020 09:22 AM   HGBA1C 7.3 (H) 05/28/2019 09:42 AM   GFR 105.94 05/28/2019 09:42 AM   GFR 112.92 04/30/2018 11:29 AM   MICROALBUR 2.3 (H) 05/28/2019 09:42 AM   MICROALBUR 42.7 (H) 04/30/2018 11:29 AM    Last diabetic Eye exam:  Lab Results  Component Value Date/Time   HMDIABEYEEXA No Retinopathy 03/30/2020 12:00 AM    Last diabetic Foot exam: No results found for: HMDIABFOOTEX   Lab Results  Component Value Date   CHOL 135 05/28/2019   HDL 33.20 (L) 05/28/2019   LDLCALC 91 05/28/2019   TRIG 55.0 05/28/2019   CHOLHDL 4 05/28/2019    Hepatic Function Latest Ref Rng & Units 05/28/2019 11/15/2017 05/11/2016  Total Protein 6.0 - 8.3 g/dL 7.8 7.8 7.7  Albumin 3.5 - 5.2 g/dL 4.4 4.3 4.4  AST 0 - 37 U/L 35 20 24  ALT 0 - 53 U/L _0 Alk Phosphatase 39 - 117 U/L 299(H) 257(H) 213(H)  Total Bilirubin 0.2 - 1.2 mg/dL 1.0 1.2 1.2  Bilirubin, Direct 0.0 - 0.3 mg/dL 0.2 - 0.2    Lab Results  Component  Value Date/Time   TSH 2.44 05/28/2019 09:42 AM   TSH 1.59 04/30/2018 11:29 AM    CBC Latest Ref Rng & Units 05/28/2019 11/15/2017 05/11/2016  WBC 4.0 - 10.5 K/uL 6.4 5.9 8.1  Hemoglobin 13.0 - 17.0 g/dL 15.6 15.6 15.2  Hematocrit 39.0 - 52.0 % 46.0 45.4 45.2  Platelets 150.0 - 400.0 K/uL 183.0 172.0 192.0    No results found for: VD25OH  Clinical ASCVD: Yes - stable angina The 10-year ASCVD risk score Mikey Bussing DC Jr., et al., 2013) is: 46.4%   Values used to calculate the score:     Age: 41 years     Sex: Male     Is Non-Hispanic African American: Yes      Diabetic: Yes     Tobacco smoker: No     Systolic Blood Pressure: 765 mmHg     Is BP treated: Yes     HDL Cholesterol: 33.2 mg/dL     Total Cholesterol: 135 mg/dL    Depression screen [facility] 2/9 12/09/2019 05/28/2019 11/23/2018  Decreased Interest 0 0 0  Down, Depressed, Hopeless 0 0 0  PHQ - 2 Score 0 0 0     CHA2DS2-VASc Score = 5  The patient's score is based upon: CHF History: No HTN History: Yes Diabetes History: Yes Stroke History: No Vascular Disease History: Yes Age Score: 2 Gender Score: 0     Social History   Tobacco Use  Smoking Status Former Smoker  . Quit date: 08/09/1999  . Years since quitting: 21.0  Smokeless Tobacco Never Used  Tobacco Comment   quit 9 years ago   BP Readings from Last 3 Encounters:  02/04/20 (!) 142/86  12/09/19 140/80  06/03/19 (!) 152/86   Pulse Readings from Last 3 Encounters:  02/04/20 84  12/09/19 80  06/03/19 84   Wt Readings from Last 3 Encounters:  02/04/20 243 lb 2 oz (110.3 kg)  12/09/19 241 lb 9.6 oz (109.6 kg)  06/04/19 242 lb (109.8 kg)    Assessment/Interventions: Review of patient past medical history, allergies, medications, health status, including review of consultants reports, laboratory and other test data, was performed as part of comprehensive evaluation and provision of chronic care management services.   SDOH:  (Social Determinants of Health) assessments and interventions performed: Yes SDOH Interventions   Flowsheet Row Most Recent Value  SDOH Interventions   Financial Strain Interventions Other (Comment)  [use Engineer, maintenance for [employer] during donut hole]      CCM Care Plan  Allergies  Allergen Reactions  . Benazepril Cough  . Metformin And Related Nausea Only    Medications Reviewed Today    Reviewed by [name], MD (Physician) on 02/05/20 at 1002  Med List Status: <None>  Medication Order Taking? Sig Documenting Provider Last Dose Status Informant  diltiazem (CARDIZEM CD) 240 MG 24 hr  capsule [id number] Yes TAKE 1 CAPSULE(240 MG) BY MOUTH DAILY [name], MD Taking Active   latanoprost (XALATAN) 0.005 % ophthalmic solution [id number] Yes INT 1 GTT IN OU QD IN THE EVE [provider] Taking Active   metoprolol tartrate (LOPRESSOR) 100 MG tablet [id number] Yes TAKE 1 TABLET BY MOUTH TWICE DAILY KEEP UPCOMING APPOINTMENT Minus Breeding, MD Taking Active   rivaroxaban (XARELTO) 20 MG TABS tablet [id number] Yes Take 1 tablet (20 mg total) by mouth daily with supper. Minus Breeding, MD Taking Active   simvastatin (ZOCOR) 40 MG tablet [id number] Yes TAKE 1 TABLET(40 MG) BY MOUTH DAILY AT 6  PM Minus Breeding, MD Taking Active           Patient Active Problem List   Diagnosis Date Noted  . Chronic atrial fibrillation ([place]) 06/01/2019  . Dyslipidemia 06/01/2019  . Stable angina pectoris ([place]) 05/28/2019  . Type 2 diabetes mellitus with complication, without long-term current use of insulin ([place]) 11/16/2017  . BPH (benign prostatic hyperplasia) 12/12/2013  . Routine general medical examination at a health care facility 12/12/2013  . Cardiomyopathy, nonischemic ([place]) 08/09/2011  . Hyperlipidemia with target LDL less than 100 07/14/2007  . MITRAL REGURGITATION, MILD 07/14/2007  . Essential hypertension 07/14/2007  . Persistent atrial fibrillation ([place]) 07/14/2007  . DEGENERATIVE JOINT DISEASE, HIPS 07/14/2007    Immunization History  Administered Date(s) Administered  . Fluad Quad(high Dose 65+) 05/28/2019  . Moderna Sars-Covid-2 Vaccination 07/30/2019, 08/27/2019  . Pneumococcal Conjugate-13 12/12/2013  . Pneumococcal Polysaccharide-23 05/11/2016  . Tdap 12/12/2013    Conditions to be addressed/monitored:  Hypertension, Hyperlipidemia, Diabetes, Atrial Fibrillation and Glaucoma  Care Plan : [place]  Updates made by Charlton Haws, [place] since 08/13/2020 12:00 AM    Problem: Hypertension, Hyperlipidemia, Diabetes, Atrial Fibrillation and  Glaucoma   Priority: High    Long-Range Goal: Disease management   Start Date: 08/13/2020  Expected End Date: 08/13/2021  This Visit's Progress: On track  Priority: High  Note:   Current Barriers:  . Unable to independently afford treatment regimen . Unable to independently monitor therapeutic efficacy  Pharmacist Clinical Goal(s):  Marland Kitchen Over the next 180 days, patient will verbalize ability to afford treatment regimen . achieve adherence to monitoring guidelines and medication adherence to achieve therapeutic efficacy through collaboration with PharmD and provider.   Interventions: . 1:1 collaboration with [name], MD regarding development and update of comprehensive plan of care as evidenced by provider attestation and co-signature . Inter-disciplinary care team collaboration (see longitudinal plan of care) . Comprehensive medication review performed; medication list updated in electronic medical record  Hypertension (BP goal <140/90) -controlled - per patient report of home BP -Current treatment: . Diltiazem CD 240 mg daily . Metoprolol tartrate 100 mg BID -Medications previously tried: benazepril, furosemide,  -Current home readings: unknown, however patient's granddaughter is a Marine scientist and checks occasionally, according to her his BP is "good" -Denies hypotensive/hypertensive symptoms -Educated on BP goals and benefits of medications for prevention of heart attack, stroke and kidney damage; Daily salt intake goal < 2300 mg; Importance of home blood pressure monitoring; -Counseled to monitor BP at home weekly, document, and provide log at future appointments -Counseled on diet and exercise extensively Recommended to continue current medication  Hyperlipidemia: (LDL goal < 100) -controlled -Current treatment: . Simvastatin 40 mg daily -Current dietary patterns: drinks lemon-water -Current exercise habits: walks on track several times per week -Educated on Cholesterol  goals;  Benefits of statin for ASCVD risk reduction; -Counseled on diet and exercise extensively Recommended to continue current medication  Diabetes (A1c goal <7%) -uncontrolled - last A1c was off of medication -Current medications: . Farxiga 10 mg daily -Medications previously tried: Xigduo  -Current home glucose readings: not checking, not indicatedDenies hypoglycemic/hyperglycemic symptoms -Educated onA1c and blood sugar goals; Exercise goal of 150 minutes per week; -Counseled to check feet daily and get yearly eye exams -Counseled on diet and exercise extensively Recommended to continue current medication  Atrial Fibrillation (Goal: prevent stroke and major bleeding) -controlled  -CHADSVASC: 5 -Current treatment: . Rate control: Diltiazem CD 240 mg, metoprolol tartrate 100 mg  BID . Anticoagulation: Xarelto 20 mg daily -Medications previously tried: Tikosyn -Counseled on increased risk of stroke due to Afib and benefits of anticoagulation for stroke prevention; importance of adherence to anticoagulant exactly as prescribed; bleeding risk associated with Xarelto and importance of self-monitoring for signs/symptoms of bleeding; -Recommended to continue current medication Assessed patient finances. Gave number for [employer] for cost savings on Xarelto during donut hole  Glaucoma (Goal: prevent progression) -controlled -Current treatment  . Latanoprost 0.005% eye drops -Patient is satisfied with current regimen and denies issues -Recommended to continue current medication  Health Maintenance -Vaccine gaps: Flu, Covid booster -Current therapy:  . Omega-3 fish oil -Counseled on bleeding risk with fish oil and Xarelto; of note patient's triglcyerides are great -Recommended to stop omega-3 supplement due to unnecessary bleeding risk  -Recommend to schedule 56-monthPCP follow up visit  Patient Goals/Self-Care Activities . Over the next 180 days, patient will:  - take  medications as prescribed focus on medication adherence by pill box check blood pressure weekly, document, and provide at future appointments target a minimum of 150 minutes of moderate intensity exercise weekly  Follow Up Plan: Telephone follow up appointment with care management team member scheduled for: 6 months      Medication Assistance:  -Xarelto and FWilder [place] affordable until donut hole. Gave phone number for [employer](888-XARELTO) for Xarelto assistance during donut hole. Pt declined needing help with FIran  Patient's preferred pharmacy is:  [phone] [address]Phone: [phone]: [phone] Uses pill box? Yes Pt endorses 100% compliance  We discussed: Current pharmacy is preferred with insurance plan and patient is satisfied with pharmacy services Patient decided to: Continue current medication management strategy  Care Plan and Follow Up Patient Decision:  Patient agrees to Care Plan and Follow-up.  Plan: Telephone follow up appointment with care management team member scheduled for:  6 months  LCharlene Brooke PharmD, BHurst Ambulatory Surgery Center LLC Dba Precinct Ambulatory Surgery Center LLCClinical Pharmacist LSomersPrimary Care at [facility]Kind[phone]

## 2020-08-20 NOTE — Addendum Note (Signed)
Addended by: Hinda Kehr on: 08/20/2020 08:01 AM   Modules accepted: Orders

## 2020-08-21 ENCOUNTER — Other Ambulatory Visit: Payer: Self-pay | Admitting: Cardiology

## 2020-08-24 ENCOUNTER — Other Ambulatory Visit: Payer: Self-pay | Admitting: Cardiology

## 2020-08-24 DIAGNOSIS — E785 Hyperlipidemia, unspecified: Secondary | ICD-10-CM

## 2020-08-25 ENCOUNTER — Other Ambulatory Visit: Payer: Self-pay | Admitting: Cardiology

## 2020-08-31 ENCOUNTER — Other Ambulatory Visit: Payer: Self-pay | Admitting: Cardiology

## 2020-08-31 DIAGNOSIS — E785 Hyperlipidemia, unspecified: Secondary | ICD-10-CM

## 2020-09-01 NOTE — Telephone Encounter (Signed)
Needs to make an appt before we can refill again

## 2020-09-28 DIAGNOSIS — H401132 Primary open-angle glaucoma, bilateral, moderate stage: Secondary | ICD-10-CM | POA: Diagnosis not present

## 2020-10-01 DIAGNOSIS — Z1152 Encounter for screening for COVID-19: Secondary | ICD-10-CM | POA: Diagnosis not present

## 2020-10-08 DIAGNOSIS — Z1152 Encounter for screening for COVID-19: Secondary | ICD-10-CM | POA: Diagnosis not present

## 2020-10-15 DIAGNOSIS — Z1152 Encounter for screening for COVID-19: Secondary | ICD-10-CM | POA: Diagnosis not present

## 2020-10-23 ENCOUNTER — Other Ambulatory Visit: Payer: Self-pay | Admitting: Internal Medicine

## 2020-10-23 DIAGNOSIS — R000 Tachycardia, unspecified: Secondary | ICD-10-CM

## 2020-10-23 DIAGNOSIS — Z1152 Encounter for screening for COVID-19: Secondary | ICD-10-CM | POA: Diagnosis not present

## 2020-10-23 DIAGNOSIS — I4819 Other persistent atrial fibrillation: Secondary | ICD-10-CM

## 2020-10-30 DIAGNOSIS — Z1152 Encounter for screening for COVID-19: Secondary | ICD-10-CM | POA: Diagnosis not present

## 2020-11-02 ENCOUNTER — Other Ambulatory Visit: Payer: Self-pay | Admitting: Cardiology

## 2020-11-02 DIAGNOSIS — I4819 Other persistent atrial fibrillation: Secondary | ICD-10-CM

## 2020-11-02 NOTE — Telephone Encounter (Signed)
78m, 110.3kg, scr 0.94 02/04/20, lovw/hochrein 06/03/19 (OVERDUE), ccr 102.7, Will send one month refill with note of needs appointment

## 2020-11-04 ENCOUNTER — Telehealth: Payer: Self-pay | Admitting: Pharmacist

## 2020-11-04 NOTE — Progress Notes (Signed)
Chronic Care Management Pharmacy Assistant   Name: ESLI JERNIGAN  MRN: [id number] DOB: August 20, 1940    Reason for Encounter: Hypertension Disease State Call   Conditions to be addressed/monitored: HTN   Recent office visits:  None ID  Recent consult visits:  None ID Hospital visits:  None in previous 6 months  Medications: Outpatient Encounter Medications as of 11/04/2020  Medication Sig  . dapagliflozin propanediol (FARXIGA) 10 MG TABS tablet Take 1 tablet (10 mg total) by mouth daily before breakfast.  . diltiazem (CARDIZEM CD) 240 MG 24 hr capsule TAKE 1 CAPSULE(240 MG) BY MOUTH DAILY  . latanoprost (XALATAN) 0.005 % ophthalmic solution INT 1 GTT IN OU QD IN THE EVE  . metoprolol tartrate (LOPRESSOR) 100 MG tablet Take 1 tablet (100 mg total) by mouth 2 (two) times daily. NEED OV.  . rivaroxaban (XARELTO) 20 MG TABS tablet Take 1 tablet (20 mg total) by mouth daily with supper. MUST MAKE APPOINTMENT WITH CARDIOLOGIST FOR FURTHER REFILLS  . simvastatin (ZOCOR) 40 MG tablet TAKE 1 TABLET(40 MG) BY MOUTH DAILY AT 6 PM   No facility-administered encounter medications on file as of 11/04/2020.    Reviewed chart prior to disease state call. Spoke with patient regarding BP  Recent Office Vitals: BP Readings from Last 3 Encounters:  02/04/20 (!) 142/86  12/09/19 140/80  06/03/19 (!) 152/86   Pulse Readings from Last 3 Encounters:  02/04/20 84  12/09/19 80  06/03/19 84    Wt Readings from Last 3 Encounters:  02/04/20 243 lb 2 oz (110.3 kg)  12/09/19 241 lb 9.6 oz (109.6 kg)  06/04/19 242 lb (109.8 kg)     Kidney Function Lab Results  Component Value Date/Time   CREATININE 0.94 02/04/2020 09:22 AM   CREATININE 0.85 05/28/2019 09:42 AM   CREATININE 0.85 04/30/2018 11:29 AM   GFR 105.94 05/28/2019 09:42 AM   GFRNONAA 78 02/04/2020 09:22 AM   GFRAA 90 02/04/2020 09:22 AM    BMP Latest Ref Rng & Units 02/04/2020 05/28/2019 04/30/2018  Glucose 65 - 99 mg/dL 131(H)  145(H) 116(H)  BUN 7 - 25 mg/dL 11 13 9   Creatinine 0.70 - 1.18 mg/dL 0.94 0.85 0.85  BUN/Creat Ratio 6 - 22 (calc) NOT APPLICABLE - -  Sodium 623 - 146 mmol/L 138 138 137  Potassium 3.5 - 5.3 mmol/L 4.1 4.4 4.0  Chloride 98 - 110 mmol/L 104 103 102  CO2 20 - 32 mmol/L 26 26 27   Calcium 8.6 - 10.3 mg/dL 9.5 9.7 9.7    . Current antihypertensive regimen:  Metoprolol tartrate 100 mg 1 tab twice daily Diltiazem 240 mg daily  . How often are you checking your Blood Pressure? infrequently, patient states he mostly gets it checked when at the doctor's office, or when granddaughter comes by to check it  . Current home BP readings: Last reading was 142/86 back in August  . What recent interventions/DTPs have been made by any provider to improve Blood Pressure control since last CPP Visit: Monitor blood pressure and keep log,continue diet and exercise, continue medications  . Any recent hospitalizations or ED visits since last visit with CPP? No   . What diet changes have been made to improve Blood Pressure Control?  Patient states that he has not made any changes to diet  . What exercise is being done to improve your Blood Pressure Control?  Patient states that he stays active around the house and yard  Adherence Review: Is the patient  currently on ACE/ARB medication? Yes, Simvastatin Does the patient have >5 day gap between last estimated fill dates? No   Star Rating Drugs: Simvastatin 09/01/20 90 ds  [place] Pharmacist Assistant [phone]  Time spent:15

## 2020-11-05 ENCOUNTER — Ambulatory Visit: Payer: Medicare HMO | Admitting: Internal Medicine

## 2020-11-05 DIAGNOSIS — Z1152 Encounter for screening for COVID-19: Secondary | ICD-10-CM | POA: Diagnosis not present

## 2020-11-12 DIAGNOSIS — Z1152 Encounter for screening for COVID-19: Secondary | ICD-10-CM | POA: Diagnosis not present

## 2020-11-19 DIAGNOSIS — Z1152 Encounter for screening for COVID-19: Secondary | ICD-10-CM | POA: Diagnosis not present

## 2020-11-28 DIAGNOSIS — Z1152 Encounter for screening for COVID-19: Secondary | ICD-10-CM | POA: Diagnosis not present

## 2020-11-30 ENCOUNTER — Encounter: Payer: Self-pay | Admitting: Internal Medicine

## 2020-11-30 ENCOUNTER — Ambulatory Visit (INDEPENDENT_AMBULATORY_CARE_PROVIDER_SITE_OTHER): Payer: Medicare HMO | Admitting: Internal Medicine

## 2020-11-30 ENCOUNTER — Other Ambulatory Visit: Payer: Self-pay

## 2020-11-30 VITALS — BP 134/88 | HR 96 | Temp 98.0°F | Ht 73.0 in | Wt 237.0 lb

## 2020-11-30 DIAGNOSIS — M79675 Pain in left toe(s): Secondary | ICD-10-CM | POA: Diagnosis not present

## 2020-11-30 DIAGNOSIS — I4819 Other persistent atrial fibrillation: Secondary | ICD-10-CM

## 2020-11-30 DIAGNOSIS — I428 Other cardiomyopathies: Secondary | ICD-10-CM | POA: Diagnosis not present

## 2020-11-30 DIAGNOSIS — Z0000 Encounter for general adult medical examination without abnormal findings: Secondary | ICD-10-CM | POA: Diagnosis not present

## 2020-11-30 DIAGNOSIS — E118 Type 2 diabetes mellitus with unspecified complications: Secondary | ICD-10-CM

## 2020-11-30 DIAGNOSIS — E785 Hyperlipidemia, unspecified: Secondary | ICD-10-CM

## 2020-11-30 DIAGNOSIS — M79674 Pain in right toe(s): Secondary | ICD-10-CM

## 2020-11-30 DIAGNOSIS — R000 Tachycardia, unspecified: Secondary | ICD-10-CM

## 2020-11-30 DIAGNOSIS — I10 Essential (primary) hypertension: Secondary | ICD-10-CM | POA: Diagnosis not present

## 2020-11-30 DIAGNOSIS — B351 Tinea unguium: Secondary | ICD-10-CM

## 2020-11-30 DIAGNOSIS — J439 Emphysema, unspecified: Secondary | ICD-10-CM | POA: Diagnosis not present

## 2020-11-30 LAB — CBC WITH DIFFERENTIAL/PLATELET
Basophils Absolute: 0.1 10*3/uL (ref 0.0–0.1)
Basophils Relative: 0.7 % (ref 0.0–3.0)
Eosinophils Absolute: 0.2 10*3/uL (ref 0.0–0.7)
Eosinophils Relative: 2.2 % (ref 0.0–5.0)
HCT: 43.7 % (ref 39.0–52.0)
Hemoglobin: 15.1 g/dL (ref 13.0–17.0)
Lymphocytes Relative: 34 % (ref 12.0–46.0)
Lymphs Abs: 2.6 10*3/uL (ref 0.7–4.0)
MCHC: 34.6 g/dL (ref 30.0–36.0)
MCV: 97.3 fl (ref 78.0–100.0)
Monocytes Absolute: 0.7 10*3/uL (ref 0.1–1.0)
Monocytes Relative: 8.7 % (ref 3.0–12.0)
Neutro Abs: 4.2 10*3/uL (ref 1.4–7.7)
Neutrophils Relative %: 54.4 % (ref 43.0–77.0)
Platelets: 163 10*3/uL (ref 150.0–400.0)
RBC: 4.5 Mil/uL (ref 4.22–5.81)
RDW: 13.3 % (ref 11.5–15.5)
WBC: 7.8 10*3/uL (ref 4.0–10.5)

## 2020-11-30 LAB — TSH: TSH: 2.13 u[IU]/mL (ref 0.35–4.50)

## 2020-11-30 LAB — MICROALBUMIN / CREATININE URINE RATIO
Creatinine,U: 221.7 mg/dL
Microalb Creat Ratio: 1.2 mg/g (ref 0.0–30.0)
Microalb, Ur: 2.7 mg/dL — ABNORMAL HIGH (ref 0.0–1.9)

## 2020-11-30 LAB — HEMOGLOBIN A1C: Hgb A1c MFr Bld: 7 % — ABNORMAL HIGH (ref 4.6–6.5)

## 2020-11-30 NOTE — Progress Notes (Signed)
Subjective:  Patient ID: Edward Pittman, male    DOB: [birth year]  Age: 80 y.o. MRN: [id number]  CC: Annual Exam, Hyperlipidemia, and Diabetes  This visit occurred during the SARS-CoV-2 public health emergency.  Safety protocols were in place, including screening questions prior to the visit, additional usage of staff PPE, and extensive cleaning of exam room while observing appropriate contact time as indicated for disinfecting solutions.    HPI Edward Pittman presents for a CPX and f/up -   Edward Pittman is active and denies any recent episodes of palpitations, diaphoresis, dizziness, lightheadedness, chest pain, shortness of breath, near-syncope, or edema.  Outpatient Medications Prior to Visit  Medication Sig Dispense Refill  . latanoprost (XALATAN) 0.005 % ophthalmic solution INT 1 GTT IN OU QD IN THE EVE    . metoprolol tartrate (LOPRESSOR) 100 MG tablet Take 1 tablet (100 mg total) by mouth 2 (two) times daily. NEED OV. 60 tablet 1  . rivaroxaban (XARELTO) 20 MG TABS tablet Take 1 tablet (20 mg total) by mouth daily with supper. MUST MAKE APPOINTMENT WITH CARDIOLOGIST FOR FURTHER REFILLS 30 tablet 0  . simvastatin (ZOCOR) 40 MG tablet TAKE 1 TABLET(40 MG) BY MOUTH DAILY AT 6 PM 90 tablet 1  . dapagliflozin propanediol (FARXIGA) 10 MG TABS tablet Take 1 tablet (10 mg total) by mouth daily before breakfast. 90 tablet 1  . diltiazem (CARDIZEM CD) 240 MG 24 hr capsule TAKE 1 CAPSULE(240 MG) BY MOUTH DAILY 90 capsule 0   No facility-administered medications prior to visit.    ROS Review of Systems  Constitutional: Negative for diaphoresis and fatigue.  HENT: Negative.   Eyes: Negative for visual disturbance.  Respiratory: Negative for cough, chest tightness, shortness of breath and wheezing.   Cardiovascular: Negative for chest pain, palpitations and leg swelling.  Gastrointestinal: Negative for abdominal pain, diarrhea, nausea and vomiting.  Endocrine: Negative for polydipsia,  polyphagia and polyuria.  Genitourinary: Negative.  Negative for difficulty urinating.  Musculoskeletal: Negative for arthralgias and myalgias.  Skin: Negative.  Negative for color change and pallor.  Allergic/Immunologic: Negative.   Neurological: Negative.  Negative for dizziness, weakness and light-headedness.  Hematological: Negative for adenopathy. Does not bruise/bleed easily.    Objective:  BP 134/88 (BP Location: Left Arm, Patient Position: Sitting, Cuff Size: Large)   Pulse 96   Temp 98 F (36.7 C) (Oral)   Ht 6\' 1"  (1.854 m)   Wt 237 lb (107.5 kg)   SpO2 98%   BMI 31.27 kg/m   BP Readings from Last 3 Encounters:  11/30/20 134/88  02/04/20 (!) 142/86  12/09/19 140/80    Wt Readings from Last 3 Encounters:  11/30/20 237 lb (107.5 kg)  02/04/20 243 lb 2 oz (110.3 kg)  12/09/19 241 lb 9.6 oz (109.6 kg)    Physical Exam Vitals reviewed.  Constitutional:      Appearance: Normal appearance.  HENT:     Nose: Nose normal.     Mouth/Throat:     Mouth: Mucous membranes are moist.  Eyes:     General: No scleral icterus.    Conjunctiva/sclera: Conjunctivae normal.  Cardiovascular:     Rate and Rhythm: Normal rate. Rhythm irregularly irregular.     Heart sounds: Normal heart sounds, S1 normal and S2 normal. No murmur heard.     Comments: EKG- A fib, 94 bpm Otherwise normal EKG Pulmonary:     Effort: Pulmonary effort is normal.     Breath sounds: No  stridor. No wheezing, rhonchi or rales.  Abdominal:     General: Abdomen is flat. Bowel sounds are normal. There is no distension.     Palpations: Abdomen is soft. There is no hepatomegaly, splenomegaly or mass.     Tenderness: There is no abdominal tenderness.  Musculoskeletal:     Cervical back: Neck supple.     Right lower leg: No edema.     Left lower leg: No edema.  Lymphadenopathy:     Cervical: No cervical adenopathy.  Skin:    General: Skin is warm and dry.     Coloration: Skin is not pale.   Neurological:     General: No focal deficit present.     Mental Status: Edward Pittman is alert. Mental status is at baseline.  Psychiatric:        Mood and Affect: Mood normal.        Behavior: Behavior normal.     Lab Results  Component Value Date   WBC 7.8 11/30/2020   HGB 15.1 11/30/2020   HCT 43.7 11/30/2020   PLT 163.0 11/30/2020   GLUCOSE 94 11/30/2020   CHOL 117 11/30/2020   TRIG 49.0 11/30/2020   HDL 35.60 (L) 11/30/2020   LDLCALC 71 11/30/2020   ALT 22 11/30/2020   AST 28 11/30/2020   NA 138 11/30/2020   K 4.1 11/30/2020   CL 104 11/30/2020   CREATININE 0.94 11/30/2020   BUN 12 11/30/2020   CO2 23 11/30/2020   TSH 2.13 11/30/2020   PSA 0.67 05/28/2019   INR 2.8 08/03/2012   HGBA1C 7.0 (H) 11/30/2020   MICROALBUR 2.7 (H) 11/30/2020    Myocardial Perfusion Imaging  Result Date: 06/04/2019  There was no ST segment deviation noted during stress.  No evidence of ischemia on stress imaging  Small fixed perfusion abnormality in the apical septum may be secondary to attenuation from body habitus.    Assessment & Plan:   Edward Pittman was seen today for annual exam, hyperlipidemia and diabetes.  Diagnoses and all orders for this visit:  Essential hypertension- His blood pressure is adequately well controlled. -     EKG 12-Lead -     CBC with Differential/Platelet; Future -     Basic metabolic panel; Future -     TSH; Future -     Urinalysis, Routine w reflex microscopic; Future -     Hepatic function panel; Future -     Hepatic function panel -     Urinalysis, Routine w reflex microscopic -     TSH -     Basic metabolic panel -     CBC with Differential/Platelet -     diltiazem (CARDIZEM CD) 240 MG 24 hr capsule; Take 1 capsule (240 mg total) by mouth daily.  Type 2 diabetes mellitus with complication, without long-term current use of insulin ([place])- His A1c is at 7.0%.  Will continue the current dose of the SGLT2 inhibitor. -     HM Diabetes Foot Exam -     Microalbumin  / creatinine urine ratio; Future -     Hepatic function panel; Future -     Hemoglobin A1c; Future -     Hemoglobin A1c -     Hepatic function panel -     Microalbumin / creatinine urine ratio -     dapagliflozin propanediol (FARXIGA) 10 MG TABS tablet; Take 1 tablet (10 mg total) by mouth daily before breakfast.  Hyperlipidemia with target LDL less than 100- LDL goal  achieved. Doing well on the statin -     Lipid panel; Future -     TSH; Future -     Hepatic function panel; Future -     Hepatic function panel -     TSH -     Lipid panel  Routine general medical examination at a health care facility- Exam completed, labs reviewed, vaccines reviewed, no cancer screenings indicated, patient education was given.  Pain due to onychomycosis of toenails of both feet -     Ambulatory referral to Podiatry  Persistent atrial fibrillation ([place])- Edward Pittman has good rate control.  Will continue the current dose of diltiazem. -     TSH; Future -     TSH -     diltiazem (CARDIZEM CD) 240 MG 24 hr capsule; Take 1 capsule (240 mg total) by mouth daily.  Cardiomyopathy, nonischemic ([facility]) -     dapagliflozin propanediol (FARXIGA) 10 MG TABS tablet; Take 1 tablet (10 mg total) by mouth daily before breakfast. -     diltiazem (CARDIZEM CD) 240 MG 24 hr capsule; Take 1 capsule (240 mg total) by mouth daily.  Tachycardia- His heart rate is normal.  Will continue the current dose of diltiazem. -     diltiazem (CARDIZEM CD) 240 MG 24 hr capsule; Take 1 capsule (240 mg total) by mouth daily.   I have changed Edward Pittman's diltiazem. I am also having him maintain his latanoprost, metoprolol tartrate, simvastatin, rivaroxaban, and dapagliflozin propanediol.  Meds ordered this encounter  Medications  . dapagliflozin propanediol (FARXIGA) 10 MG TABS tablet    Sig: Take 1 tablet (10 mg total) by mouth daily before breakfast.    Dispense:  90 tablet    Refill:  1  . diltiazem (CARDIZEM CD) 240 MG 24 hr  capsule    Sig: Take 1 capsule (240 mg total) by mouth daily.    Dispense:  90 capsule    Refill:  1     Follow-up: Return in about 6 months (around 06/01/2021).  Scarlette Calico, MD

## 2020-11-30 NOTE — Patient Instructions (Signed)

## 2020-12-01 ENCOUNTER — Encounter: Payer: Self-pay | Admitting: Internal Medicine

## 2020-12-01 LAB — HEPATIC FUNCTION PANEL
ALT: 22 U/L (ref 0–53)
AST: 28 U/L (ref 0–37)
Albumin: 4.4 g/dL (ref 3.5–5.2)
Alkaline Phosphatase: 235 U/L — ABNORMAL HIGH (ref 39–117)
Bilirubin, Direct: 0.2 mg/dL (ref 0.0–0.3)
Total Bilirubin: 1 mg/dL (ref 0.2–1.2)
Total Protein: 8 g/dL (ref 6.0–8.3)

## 2020-12-01 LAB — LIPID PANEL
Cholesterol: 117 mg/dL (ref 0–200)
HDL: 35.6 mg/dL — ABNORMAL LOW (ref >39.00)
LDL Cholesterol: 71 mg/dL (ref 0–99)
NonHDL: 81.04
Total CHOL/HDL Ratio: 3
Triglycerides: 49 mg/dL (ref 0.0–149.0)
VLDL: 9.8 mg/dL (ref 0.0–40.0)

## 2020-12-01 LAB — URINALYSIS, ROUTINE W REFLEX MICROSCOPIC
Bilirubin Urine: NEGATIVE
Leukocytes,Ua: NEGATIVE
Nitrite: NEGATIVE
Specific Gravity, Urine: >=1.03 — AB (ref 1.000–1.030)
Total Protein, Urine: NEGATIVE
Urine Glucose: >=1000 — AB
Urobilinogen, UA: 0.2 (ref 0.0–1.0)
WBC, UA: NONE SEEN (ref 0–?)
pH: 5 (ref 5.0–8.0)

## 2020-12-01 LAB — BASIC METABOLIC PANEL
BUN: 12 mg/dL (ref 6–23)
CO2: 23 mEq/L (ref 19–32)
Calcium: 9.5 mg/dL (ref 8.4–10.5)
Chloride: 104 mEq/L (ref 96–112)
Creatinine, Ser: 0.94 mg/dL (ref 0.40–1.50)
GFR: 78 mL/min (ref >60.00)
Glucose, Bld: 94 mg/dL (ref 70–99)
Potassium: 4.1 mEq/L (ref 3.5–5.1)
Sodium: 138 mEq/L (ref 135–145)

## 2020-12-01 MED ORDER — DAPAGLIFLOZIN PROPANEDIOL 10 MG PO TABS: 10.0000 mg | ORAL_TABLET | Freq: Every day | ORAL | 1 refills | Status: DC

## 2020-12-01 MED ORDER — DILTIAZEM HCL ER COATED BEADS 240 MG PO CP24: 240.0000 mg | ORAL_CAPSULE | Freq: Every day | ORAL | 1 refills | Status: DC

## 2020-12-03 DIAGNOSIS — Z1152 Encounter for screening for COVID-19: Secondary | ICD-10-CM | POA: Diagnosis not present

## 2020-12-07 ENCOUNTER — Telehealth: Payer: Self-pay | Admitting: Cardiology

## 2020-12-07 NOTE — Telephone Encounter (Signed)
*  STAT* If patient is at the pharmacy, call can be transferred to refill team.   1. Which medications need to be refilled? (please list name of each medication and dose if known) rivaroxaban (XARELTO) 20 MG TABS tablet  2. Which pharmacy/location (including street and city if local pharmacy) is medication to be sent to?   3. Do they need a 30 day or 90 day supply? 30   Patient is out of medication  Patient is scheduled to see Dr. Percival Spanish 12/24/20

## 2020-12-07 NOTE — Telephone Encounter (Signed)
Called and lmom pt to call us back and let us know what pharmacy they want their refill sent to

## 2020-12-10 DIAGNOSIS — Z1152 Encounter for screening for COVID-19: Secondary | ICD-10-CM | POA: Diagnosis not present

## 2020-12-17 ENCOUNTER — Other Ambulatory Visit: Payer: Self-pay | Admitting: Cardiology

## 2020-12-17 DIAGNOSIS — I4819 Other persistent atrial fibrillation: Secondary | ICD-10-CM

## 2020-12-17 NOTE — Telephone Encounter (Signed)
50m, 107.5kg, scr 0.94 11/30/20, lovw/hochrein 06/04/19(OVERDUE HAS AN APPT SCHEDULED). CCR 100.1

## 2020-12-18 ENCOUNTER — Other Ambulatory Visit: Payer: Self-pay

## 2020-12-18 ENCOUNTER — Encounter: Payer: Self-pay | Admitting: Emergency Medicine

## 2020-12-18 ENCOUNTER — Ambulatory Visit
Admission: EM | Admit: 2020-12-18 | Discharge: 2020-12-18 | Disposition: A | Discharge status: Home / Self Care | Payer: Medicare HMO

## 2020-12-18 DIAGNOSIS — M25472 Effusion, left ankle: Secondary | ICD-10-CM | POA: Diagnosis not present

## 2020-12-18 DIAGNOSIS — R2242 Localized swelling, mass and lump, left lower limb: Secondary | ICD-10-CM | POA: Diagnosis not present

## 2020-12-18 NOTE — ED Triage Notes (Addendum)
Patient c/o LFT foot swelling that started today at 1700.   Patient endorses pain upon onset of symptoms.   Patient denies SOB or Chest Pain.  Patient denies fall or trauma.   Patient endorses being unable to take hist blood thinner due to financial restraints. Patient was placed on blood thinner due to A-fib.   Patient tried some vinegar and cup of water with no relief of symptoms.

## 2020-12-18 NOTE — Discharge Instructions (Addendum)
Please follow up with cardiologist for further evaluation and management as soon as possible if swelling worsens or with scheduled appointment. Please go to the hospital emergency department if swelling significantly worsens or if pain develops.   Take already prescribed as needed furosemide dose tonight to attempt to alleviate swelling. Elevated ankle above heart to reduce swelling.

## 2020-12-18 NOTE — ED Provider Notes (Signed)
[name]    CSN: [id number] Arrival date & time: 12/18/20  1803      History   Chief Complaint Chief Complaint  Edward Pittman presents with   Foot Swelling    HPI Edward Pittman is a 80 y.o. male.   HPI Edward Pittman presents today for left medial ankle swelling that started about 2 hours prior to arrival. Denies pain currently but states that he had some mild pain when swelling first started. Denies any numbness or tingling of left lower extremity. 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 Denies any history of osteoarthritis. Denies chest pain or shortness of breath. Denies history of heart failure but states that he does have atrial fibrillation where he takes xarelto daily. Has been out of his xarelto for about 5 days but has prescription currently ready for pickup at pharmacy. Edward Pittman states that he has had swelling to his ankles intermittently where he was prescribed furosemide 40 mg as needed if swelling occurs. Edward Pittman states that he has not taken the furosemide today. He did take some vinegar with water to try to relieve the swelling but there was no improvement per Edward Pittman.  Past Medical History:  Diagnosis Date   Chronic systolic dysfunction of left ventricle    Dyslipidemia    HTN (hypertension)    Hx of adenomatous colonic polyps    Hx of colonoscopy    Keloid    Mild mitral regurgitation by prior echocardiogram    Nonischemic cardiomyopathy ([place])    Persistent atrial fibrillation [facility])    s/p ablation 4/16  Dr. Rayann Heman    Edward Pittman Active Problem List   Diagnosis Date Noted   Pain due to onychomycosis of toenails of both feet 11/30/2020   Chronic atrial fibrillation ([place]) 06/01/2019   Stable angina pectoris ([place]) 05/28/2019   Type 2 diabetes mellitus with complication, without long-term current use of insulin ([place]) 11/16/2017    BPH (benign prostatic hyperplasia) 12/12/2013   Routine general medical examination at a health care facility 12/12/2013   Cardiomyopathy, nonischemic ([place]) 08/09/2011   Hyperlipidemia with target LDL less than 100 07/14/2007   MITRAL REGURGITATION, MILD 07/14/2007   Essential hypertension 07/14/2007   Persistent atrial fibrillation ([place]) 07/14/2007   DEGENERATIVE JOINT DISEASE, HIPS 07/14/2007    Past Surgical History:  Procedure Laterality Date   ATRIAL FIBRILLATION ABLATION N/A 10/11/2011   Procedure: ATRIAL FIBRILLATION ABLATION;  Surgeon: Thompson Grayer, MD;  Location: [facility] CATH LAB;  Service: Cardiovascular;  Laterality: N/A;   COLONOSCOPY     FEMORAL HERNIA REPAIR     at age 89   HERNIA REPAIR     TEE WITHOUT CARDIOVERSION  10/10/2011   Procedure: TRANSESOPHAGEAL ECHOCARDIOGRAM (TEE);  Surgeon: Fay Records, MD;  Location: [facility] ENDOSCOPY;  Service: Cardiovascular;  Laterality: N/A;       Home Medications    Prior to Admission medications   Medication Sig Start Date End Date Taking? Authorizing Provider  dapagliflozin propanediol (FARXIGA) 10 MG TABS tablet Take 1 tablet (10 mg total) by mouth daily before breakfast. 12/01/20  Yes [name], MD  diltiazem (CARDIZEM CD) 240 MG 24 hr capsule Take 1 capsule (240 mg total) by mouth daily. 12/01/20  Yes [name], MD  furosemide (LASIX) 40 MG tablet Take 40 mg by mouth.   Yes [provider]  metoprolol tartrate (LOPRESSOR) 100 MG tablet Take 1 tablet (100 mg total) by mouth 2 (two) times daily. NEED OV. 08/25/20  Yes Minus Breeding, MD  rivaroxaban (XARELTO) 20 MG TABS tablet TAKE 1 TABLET(20 MG) BY MOUTH DAILY WITH SUPPER 12/17/20  Yes Hochrein, Jeneen Rinks, MD  simvastatin (ZOCOR) 40 MG tablet TAKE 1 TABLET(40 MG) BY MOUTH DAILY AT 6 PM 09/01/20  Yes Minus Breeding, MD  latanoprost (XALATAN) 0.005 % ophthalmic solution INT 1 GTT IN OU QD IN THE EVE 10/09/18   [provider]    Family History Family History   Problem Relation Age of Onset   Brain cancer Mother    Breast cancer Mother    Diabetes Sister    Breast cancer Sister    Brain cancer Sister    Heart attack Father    Lung cancer Brother    Lung cancer Brother    Colon cancer Other        uncle   Prostate cancer Other        uncle   Diabetes Sister    Heart disease Neg Hx    Esophageal cancer Neg Hx    Rectal cancer Neg Hx    Stomach cancer Neg Hx     Social History Social History   Tobacco Use   Smoking status: Former    Pack years: 0.00    Types: Cigarettes    Quit date: 08/09/1999    Years since quitting: 21.3   Smokeless tobacco: Never   Tobacco comments:    quit 9 years ago  Vaping Use   Vaping Use: Never used  Substance Use Topics   Alcohol use: No   Drug use: No     Allergies   Benazepril and Metformin and related   Review of Systems Review of Systems Per HPI  Physical Exam Triage Vital Signs ED Triage Vitals  Enc Vitals Group     BP 12/18/20 1911 (!) 151/90     Pulse Rate 12/18/20 1911 90     Resp 12/18/20 1911 19     Temp 12/18/20 1911 98.3 F (36.8 C)     Temp Source 12/18/20 1911 Oral     SpO2 12/18/20 1911 95 %     Weight --      Height --      Head Circumference --      Peak Flow --      Pain Score 12/18/20 1907 0     Pain Loc --      Pain Edu? --      Excl. in [place]? --    No data found.  Updated Vital Signs BP (!) 151/90 (BP Location: Left Arm)   Pulse 90   Temp 98.3 F (36.8 C) (Oral)   Resp 19   SpO2 95%   Visual Acuity Right Eye Distance:   Left Eye Distance:   Bilateral Distance:    Right Eye Near:   Left Eye Near:    Bilateral Near:     Physical Exam Constitutional:      Appearance: Normal appearance.  HENT:     Head: Normocephalic and atraumatic.  Eyes:     Extraocular Movements: Extraocular movements intact.     Conjunctiva/sclera: Conjunctivae normal.  Pulmonary:     Effort: Pulmonary effort is normal.  Musculoskeletal:        General: Swelling  present. No tenderness.     Left lower leg: Edema present.     Comments: Left lower extremity edema  present to left medial ankle. Pedal pulses present bilaterally. Normal sensation to left lower extremity. No redness present. Full ROM of left ankle.   Neurological:     General: No focal deficit present.     Mental Status: He is alert and oriented to person, place, and time. Mental status is at baseline.  Psychiatric:        Mood and Affect: Mood normal.        Behavior: Behavior normal.        Thought Content: Thought content normal.        Judgment: Judgment normal.     UC Treatments / Results  Labs (all labs ordered are listed, but only abnormal results are displayed) Labs Reviewed - No data to display  EKG   Radiology No results found.  Procedures Procedures (including critical care time)  Medications Ordered in UC Medications - No data to display  Initial Impression / Assessment and Plan / UC Course  I have reviewed the triage vital signs and the nursing notes.  Pertinent labs & imaging results that were available during my care of the Edward Pittman were reviewed by me and considered in my medical decision making (see chart for details).     Suspect chronic lower extremity swelling as cause of left lower ankle edema. Do not suspect DVT due to appearance of ankle on physical exam. Edward Pittman was advised to take PRN furosemide once he returns home to attempt to relieve swelling of left ankle. Also advised Edward Pittman to rest and elevate ankle above heart to reduce swelling. Edward Pittman has appointment with cardiology on 12/24/20. Follow up with cardiology sooner if swelling becomes more severe. Edward Pittman was advised to go to the hospital if pain occurs or if swelling becomes significantly worse. Suggested and offered left ankle x-ray to rule out injury, stress fracture, or any other bony abnormality but Edward Pittman declined. Discussed strict return precautions. Edward Pittman verbalized understanding and is  agreeable with plan.  Final Clinical Impressions(s) / UC Diagnoses   Final diagnoses:  Localized swelling of left lower leg  Left ankle swelling     Discharge Instructions      Please follow up with cardiologist for further evaluation and management as soon as possible if swelling worsens or with scheduled appointment. Please go to the hospital emergency department if swelling significantly worsens or if pain develops.   Take already prescribed as needed furosemide dose tonight to attempt to alleviate swelling. Elevated ankle above heart to reduce swelling.    ED Prescriptions   None    PDMP not reviewed this encounter.   Odis Luster, FNP 12/18/20 1942

## 2020-12-19 ENCOUNTER — Inpatient Hospital Stay (HOSPITAL_COMMUNITY): Payer: Medicare HMO

## 2020-12-19 ENCOUNTER — Emergency Department (HOSPITAL_COMMUNITY): Payer: Medicare HMO

## 2020-12-19 ENCOUNTER — Other Ambulatory Visit: Payer: Self-pay

## 2020-12-19 ENCOUNTER — Inpatient Hospital Stay (HOSPITAL_COMMUNITY)
Admission: EM | Admit: 2020-12-19 | Discharge: 2020-12-22 | DRG: 062 | Disposition: A | Discharge status: Home / Self Care | Payer: Medicare HMO | Attending: Neurology | Admitting: Neurology

## 2020-12-19 ENCOUNTER — Encounter (HOSPITAL_COMMUNITY): Payer: Self-pay | Admitting: Neurology

## 2020-12-19 DIAGNOSIS — Z87891 Personal history of nicotine dependence: Secondary | ICD-10-CM | POA: Diagnosis not present

## 2020-12-19 DIAGNOSIS — R29810 Facial weakness: Secondary | ICD-10-CM | POA: Diagnosis present

## 2020-12-19 DIAGNOSIS — Z6830 Body mass index (BMI) 30.0-30.9, adult: Secondary | ICD-10-CM | POA: Diagnosis not present

## 2020-12-19 DIAGNOSIS — R197 Diarrhea, unspecified: Secondary | ICD-10-CM | POA: Diagnosis not present

## 2020-12-19 DIAGNOSIS — R29708 NIHSS score 8: Secondary | ICD-10-CM | POA: Diagnosis present

## 2020-12-19 DIAGNOSIS — Z8249 Family history of ischemic heart disease and other diseases of the circulatory system: Secondary | ICD-10-CM

## 2020-12-19 DIAGNOSIS — I6501 Occlusion and stenosis of right vertebral artery: Secondary | ICD-10-CM | POA: Diagnosis not present

## 2020-12-19 DIAGNOSIS — E7800 Pure hypercholesterolemia, unspecified: Secondary | ICD-10-CM | POA: Diagnosis not present

## 2020-12-19 DIAGNOSIS — I700 Atherosclerosis of aorta: Secondary | ICD-10-CM | POA: Diagnosis not present

## 2020-12-19 DIAGNOSIS — I63443 Cerebral infarction due to embolism of bilateral cerebellar arteries: Secondary | ICD-10-CM | POA: Diagnosis not present

## 2020-12-19 DIAGNOSIS — Z91128 Patient's intentional underdosing of medication regimen for other reason: Secondary | ICD-10-CM

## 2020-12-19 DIAGNOSIS — I428 Other cardiomyopathies: Secondary | ICD-10-CM | POA: Diagnosis present

## 2020-12-19 DIAGNOSIS — E876 Hypokalemia: Secondary | ICD-10-CM | POA: Diagnosis present

## 2020-12-19 DIAGNOSIS — I639 Cerebral infarction, unspecified: Secondary | ICD-10-CM | POA: Diagnosis not present

## 2020-12-19 DIAGNOSIS — D72829 Elevated white blood cell count, unspecified: Secondary | ICD-10-CM | POA: Diagnosis not present

## 2020-12-19 DIAGNOSIS — R55 Syncope and collapse: Secondary | ICD-10-CM | POA: Diagnosis not present

## 2020-12-19 DIAGNOSIS — R42 Dizziness and giddiness: Secondary | ICD-10-CM | POA: Diagnosis not present

## 2020-12-19 DIAGNOSIS — E785 Hyperlipidemia, unspecified: Secondary | ICD-10-CM | POA: Diagnosis present

## 2020-12-19 DIAGNOSIS — I4819 Other persistent atrial fibrillation: Secondary | ICD-10-CM | POA: Diagnosis not present

## 2020-12-19 DIAGNOSIS — R29818 Other symptoms and signs involving the nervous system: Secondary | ICD-10-CM | POA: Diagnosis not present

## 2020-12-19 DIAGNOSIS — R0902 Hypoxemia: Secondary | ICD-10-CM | POA: Diagnosis not present

## 2020-12-19 DIAGNOSIS — R471 Dysarthria and anarthria: Secondary | ICD-10-CM | POA: Diagnosis present

## 2020-12-19 DIAGNOSIS — Z888 Allergy status to other drugs, medicaments and biological substances status: Secondary | ICD-10-CM

## 2020-12-19 DIAGNOSIS — I6389 Other cerebral infarction: Secondary | ICD-10-CM | POA: Diagnosis not present

## 2020-12-19 DIAGNOSIS — E669 Obesity, unspecified: Secondary | ICD-10-CM | POA: Diagnosis not present

## 2020-12-19 DIAGNOSIS — R414 Neurologic neglect syndrome: Secondary | ICD-10-CM | POA: Diagnosis present

## 2020-12-19 DIAGNOSIS — Z20822 Contact with and (suspected) exposure to covid-19: Secondary | ICD-10-CM | POA: Diagnosis not present

## 2020-12-19 DIAGNOSIS — I499 Cardiac arrhythmia, unspecified: Secondary | ICD-10-CM | POA: Diagnosis not present

## 2020-12-19 DIAGNOSIS — I10 Essential (primary) hypertension: Secondary | ICD-10-CM | POA: Diagnosis not present

## 2020-12-19 DIAGNOSIS — G8191 Hemiplegia, unspecified affecting right dominant side: Secondary | ICD-10-CM | POA: Diagnosis present

## 2020-12-19 DIAGNOSIS — R112 Nausea with vomiting, unspecified: Secondary | ICD-10-CM | POA: Diagnosis not present

## 2020-12-19 DIAGNOSIS — I482 Chronic atrial fibrillation, unspecified: Secondary | ICD-10-CM | POA: Diagnosis not present

## 2020-12-19 DIAGNOSIS — R202 Paresthesia of skin: Secondary | ICD-10-CM | POA: Diagnosis not present

## 2020-12-19 DIAGNOSIS — N280 Ischemia and infarction of kidney: Secondary | ICD-10-CM | POA: Diagnosis not present

## 2020-12-19 DIAGNOSIS — T45516A Underdosing of anticoagulants, initial encounter: Secondary | ICD-10-CM | POA: Diagnosis present

## 2020-12-19 DIAGNOSIS — Z1152 Encounter for screening for COVID-19: Secondary | ICD-10-CM | POA: Diagnosis not present

## 2020-12-19 DIAGNOSIS — Z8673 Personal history of transient ischemic attack (TIA), and cerebral infarction without residual deficits: Secondary | ICD-10-CM | POA: Diagnosis not present

## 2020-12-19 DIAGNOSIS — G319 Degenerative disease of nervous system, unspecified: Secondary | ICD-10-CM | POA: Diagnosis not present

## 2020-12-19 DIAGNOSIS — I63412 Cerebral infarction due to embolism of left middle cerebral artery: Secondary | ICD-10-CM | POA: Diagnosis not present

## 2020-12-19 DIAGNOSIS — I255 Ischemic cardiomyopathy: Secondary | ICD-10-CM | POA: Diagnosis not present

## 2020-12-19 LAB — COMPREHENSIVE METABOLIC PANEL
ALT: 42 U/L (ref 0–44)
AST: 70 U/L — ABNORMAL HIGH (ref 15–41)
Albumin: 3.9 g/dL (ref 3.5–5.0)
Alkaline Phosphatase: 195 U/L — ABNORMAL HIGH (ref 38–126)
Anion gap: 13 (ref 5–15)
BUN: 12 mg/dL (ref 8–23)
CO2: 22 mmol/L (ref 22–32)
Calcium: 9.2 mg/dL (ref 8.9–10.3)
Chloride: 97 mmol/L — ABNORMAL LOW (ref 98–111)
Creatinine, Ser: 1.12 mg/dL (ref 0.61–1.24)
GFR, Estimated: >60 mL/min (ref >60)
Glucose, Bld: 179 mg/dL — ABNORMAL HIGH (ref 70–99)
Potassium: 3.3 mmol/L — ABNORMAL LOW (ref 3.5–5.1)
Sodium: 132 mmol/L — ABNORMAL LOW (ref 135–145)
Total Bilirubin: 1.5 mg/dL — ABNORMAL HIGH (ref 0.3–1.2)
Total Protein: 7.4 g/dL (ref 6.5–8.1)

## 2020-12-19 LAB — DIFFERENTIAL
Abs Immature Granulocytes: 0.05 10*3/uL (ref 0.00–0.07)
Basophils Absolute: 0 10*3/uL (ref 0.0–0.1)
Basophils Relative: 0 %
Eosinophils Absolute: 0 10*3/uL (ref 0.0–0.5)
Eosinophils Relative: 0 %
Immature Granulocytes: 1 %
Lymphocytes Relative: 11 %
Lymphs Abs: 1.2 10*3/uL (ref 0.7–4.0)
Monocytes Absolute: 0.9 10*3/uL (ref 0.1–1.0)
Monocytes Relative: 8 %
Neutro Abs: 8.8 10*3/uL — ABNORMAL HIGH (ref 1.7–7.7)
Neutrophils Relative %: 80 %

## 2020-12-19 LAB — CBC
HCT: 43.2 % (ref 39.0–52.0)
Hemoglobin: 15 g/dL (ref 13.0–17.0)
MCH: 33.8 pg (ref 26.0–34.0)
MCHC: 34.7 g/dL (ref 30.0–36.0)
MCV: 97.3 fL (ref 80.0–100.0)
Platelets: 165 10*3/uL (ref 150–400)
RBC: 4.44 MIL/uL (ref 4.22–5.81)
RDW: 12.7 % (ref 11.5–15.5)
WBC: 10.9 10*3/uL — ABNORMAL HIGH (ref 4.0–10.5)
nRBC: 0 % (ref 0.0–0.2)

## 2020-12-19 LAB — ECHOCARDIOGRAM COMPLETE
AR max vel: 2.28 cm2
AV Area VTI: 2.27 cm2
AV Area mean vel: 2.18 cm2
AV Mean grad: 4.6 mmHg
AV Peak grad: 7.4 mmHg
Ao pk vel: 1.36 m/s
Height: 73 in
MV M vel: 5.12 m/s
MV Peak grad: 104.9 mmHg
S' Lateral: 3.2 cm
Weight: 3744 oz

## 2020-12-19 LAB — I-STAT CHEM 8, ED
BUN: 12 mg/dL (ref 8–23)
Calcium, Ion: 1.04 mmol/L — ABNORMAL LOW (ref 1.15–1.40)
Chloride: 101 mmol/L (ref 98–111)
Creatinine, Ser: 0.9 mg/dL (ref 0.61–1.24)
Glucose, Bld: 173 mg/dL — ABNORMAL HIGH (ref 70–99)
HCT: 46 % (ref 39.0–52.0)
Hemoglobin: 15.6 g/dL (ref 13.0–17.0)
Potassium: 3.4 mmol/L — ABNORMAL LOW (ref 3.5–5.1)
Sodium: 137 mmol/L (ref 135–145)
TCO2: 20 mmol/L — ABNORMAL LOW (ref 22–32)

## 2020-12-19 LAB — PROTIME-INR
INR: 1.3 — ABNORMAL HIGH (ref 0.8–1.2)
Prothrombin Time: 16.3 seconds — ABNORMAL HIGH (ref 11.4–15.2)

## 2020-12-19 LAB — MRSA NEXT GEN BY PCR, NASAL: MRSA by PCR Next Gen: NOT DETECTED

## 2020-12-19 LAB — CBG MONITORING, ED: Glucose-Capillary: 179 mg/dL — ABNORMAL HIGH (ref 70–99)

## 2020-12-19 LAB — APTT: aPTT: 25 seconds (ref 24–36)

## 2020-12-19 LAB — SARS CORONAVIRUS 2 (TAT 6-24 HRS): SARS Coronavirus 2: NEGATIVE

## 2020-12-19 MED ORDER — SIMVASTATIN 20 MG PO TABS
40.0000 mg | ORAL_TABLET | Freq: Every day | ORAL | Status: DC
  Administered 2020-12-19: 40 mg via ORAL
  Filled 2020-12-19: qty 2

## 2020-12-19 MED ORDER — IOHEXOL 350 MG/ML SOLN
75.0000 mL | Freq: Once | INTRAVENOUS | Status: AC | PRN
  Administered 2020-12-19: 75 mL via INTRAVENOUS

## 2020-12-19 MED ORDER — SODIUM CHLORIDE 0.9% FLUSH: 3.0000 mL | Freq: Once | INTRAVENOUS | Status: AC

## 2020-12-19 MED ORDER — IOHEXOL 300 MG/ML  SOLN: 75.0000 mL | Freq: Once | INTRAMUSCULAR | Status: DC | PRN

## 2020-12-19 MED ORDER — PANTOPRAZOLE SODIUM 40 MG IV SOLR
40.0000 mg | Freq: Every day | INTRAVENOUS | Status: DC
  Administered 2020-12-19: 40 mg via INTRAVENOUS

## 2020-12-19 MED ORDER — ALTEPLASE (STROKE) FULL DOSE INFUSION
90.0000 mg | Freq: Once | INTRAVENOUS | Status: AC
  Administered 2020-12-19: 100 mg via INTRAVENOUS
  Filled 2020-12-19: qty 100

## 2020-12-19 MED ORDER — PERFLUTREN LIPID MICROSPHERE
1.0000 mL | INTRAVENOUS | Status: AC | PRN
  Administered 2020-12-19: 3 mL via INTRAVENOUS
  Filled 2020-12-19: qty 10

## 2020-12-19 MED ORDER — ACETAMINOPHEN 160 MG/5ML PO SOLN: 650.0000 mg | ORAL | Status: DC | PRN

## 2020-12-19 MED ORDER — STROKE: EARLY STAGES OF RECOVERY BOOK
Freq: Once | Status: AC
  Filled 2020-12-19 (×2): qty 1

## 2020-12-19 MED ORDER — CHLORHEXIDINE GLUCONATE CLOTH 2 % EX PADS
6.0000 | MEDICATED_PAD | Freq: Every day | CUTANEOUS | Status: DC
  Administered 2020-12-19 – 2020-12-21 (×3): 6 via TOPICAL

## 2020-12-19 MED ORDER — ACETAMINOPHEN 325 MG PO TABS: 650.0000 mg | ORAL_TABLET | ORAL | Status: DC | PRN

## 2020-12-19 MED ORDER — SODIUM CHLORIDE 0.9 % IV SOLN
50.0000 mL | Freq: Once | INTRAVENOUS | Status: AC
  Administered 2020-12-19: 50 mL via INTRAVENOUS

## 2020-12-19 MED ORDER — ACETAMINOPHEN 650 MG RE SUPP: 650.0000 mg | RECTAL | Status: DC | PRN

## 2020-12-19 MED ORDER — SENNOSIDES-DOCUSATE SODIUM 8.6-50 MG PO TABS: 1.0000 | ORAL_TABLET | Freq: Every evening | ORAL | Status: DC | PRN

## 2020-12-19 NOTE — Progress Notes (Signed)
  Echocardiogram 2D Echocardiogram has been performed.  Edward Pittman 12/19/2020, 4:29 PM

## 2020-12-19 NOTE — Progress Notes (Signed)
PT Cancellation Note  Patient Details Name: Edward Pittman MRN: [id number] DOB: 05-19-1941   Cancelled Treatment:    Reason Eval/Treat Not Completed: Active bedrest order. Pt received tPA at 1228 on 6/25. Will plan to follow-up tomorrow.   Moishe Spice, PT, DPT Acute Rehabilitation Services  Pager: [phone] Office: [place] 12/19/2020, 3:37 PM

## 2020-12-19 NOTE — H&P (Addendum)
NEUROLOGY CONSULTATION NOTE   Date of service: December 19, 2020 Patient Name: Edward Pittman MRN:  [id number] DOB:  [birth year] Reason for consult: "R facial droop and L gaze preference" Requesting Provider: Donnetta Simpers, MD _ _ _   _ __   _ __ _ _  __ __   _ __   __ _  History of Present Illness  Edward Pittman is a 80 y.o. male with PMH significant for Afibb who ran out of Xarelto a week ago, hx of cardiomyopathy, HLD, HTN who woke up with some dizziness in the morning which resolved. Around 1100, he had sudden onset R sided weakness and R facial droop for which EMS was called. Noted to have R neglect and brought in as a stroke code.  CT Head without contrast with no ICH.   mRS: 0 tPA: offered and patient opted for tPA at 1228. I revisited the discussion with wife later and she agreed to keep it going. Thrombectomy: No LVO, may be ?L PCA P2 stenosis with flow distally.  NIHSS components Score: Comment  1a Level of Conscious 0[x]  1[]  2[]  3[]      1b LOC Questions 0[x]  1[]  2[]       1c LOC Commands 0[x]  1[]  2[]       2 Best Gaze 0[]  1[x]  2[]     L gaze preference but crosses midline  3 Visual 0[x]  1[]  2[]  3[]      4 Facial Palsy 0[]  1[]  2[x]  3[]      5a Motor Arm - left 0[x]  1[]  2[]  3[]  4[]  UN[]    5b Motor Arm - Right 0[x]  1[]  2[]  3[]  4[]  UN[]    6a Motor Leg - Left 0[x]  1[]  2[]  3[]  4[]  UN[]    6b Motor Leg - Right 0[x]  1[]  2[]  3[]  4[]  UN[]    7 Limb Ataxia 0[x]  1[]  2[]  3[]  UN[]     8 Sensory 0[x]  1[]  2[]  UN[]      9 Best Language 0[x]  1[]  2[]  3[]      10 Dysarthria 0[x]  1[]  2[]  UN[]      11 Extinct. and Inattention 0[]  1[x]  2[]     Visual extinction on the Right noted on arrival  TOTAL: 4      ROS   Constitutional Denies weight loss, fever and chills.   HEENT Denies changes in vision and hearing.   Respiratory Denies SOB and cough.   CV Denies palpitations and CP   GI Denies abdominal pain, nausea, vomiting and diarrhea.   GU Denies dysuria and urinary frequency.   MSK Denies  myalgia and joint pain.   Skin Denies rash and pruritus.   Neurological Denies headache and syncope.   Psychiatric Denies recent changes in mood. Denies anxiety and depression.   Past History   Past Medical History:  Diagnosis Date   Chronic systolic dysfunction of left ventricle    Dyslipidemia    HTN (hypertension)    Hx of adenomatous colonic polyps    Hx of colonoscopy    Keloid    Mild mitral regurgitation by prior echocardiogram    Nonischemic cardiomyopathy ([place])    Persistent atrial fibrillation [facility])    s/p ablation 4/16  Dr. Rayann Heman   Past Surgical History:  Procedure Laterality Date   ATRIAL FIBRILLATION ABLATION N/A 10/11/2011   Procedure: ATRIAL FIBRILLATION ABLATION;  Surgeon: Thompson Grayer, MD;  Location: [facility] CATH LAB;  Service: Cardiovascular;  Laterality: N/A;   COLONOSCOPY     FEMORAL HERNIA REPAIR     at age 36   HERNIA  REPAIR     TEE WITHOUT CARDIOVERSION  10/10/2011   Procedure: TRANSESOPHAGEAL ECHOCARDIOGRAM (TEE);  Surgeon: Fay Records, MD;  Location: [facility] ENDOSCOPY;  Service: Cardiovascular;  Laterality: N/A;   Family History  Problem Relation Age of Onset   Brain cancer Mother    Breast cancer Mother    Diabetes Sister    Breast cancer Sister    Brain cancer Sister    Heart attack Father    Lung cancer Brother    Lung cancer Brother    Colon cancer Other        uncle   Prostate cancer Other        uncle   Diabetes Sister    Heart disease Neg Hx    Esophageal cancer Neg Hx    Rectal cancer Neg Hx    Stomach cancer Neg Hx    Social History   Socioeconomic History   Marital status: Married    Spouse name: Not on file   Number of children: 4   Years of education: Not on file   Highest education level: Not on file  Occupational History   Occupation: Autobody Repairman  Tobacco Use   Smoking status: Former    Pack years: 0.00    Types: Cigarettes    Quit date: 08/09/1999    Years since quitting: 21.3   Smokeless tobacco: Never   Tobacco  comments:    quit 9 years ago  Vaping Use   Vaping Use: Never used  Substance and Sexual Activity   Alcohol use: No   Drug use: No   Sexual activity: Not Currently  Other Topics Concern   Not on file  Social History Narrative   Married   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      4 kids + grandkids      Former smoker, no current tobacco alcohol or drug use      Social Determinants of Radio broadcast assistant Strain: Medium Risk   Difficulty of Paying Living Expenses: Somewhat hard  Food Insecurity: Not on file  Transportation Needs: Not on file  Physical Activity: Not on file  Stress: Not on file  Social Connections: Not on file   Allergies  Allergen Reactions   Benazepril Cough   Metformin And Related Nausea Only    Medications  (Not in a hospital admission)    Vitals   Vitals:   12/19/20 1200 12/19/20 1322 12/19/20 1335  BP:  (!) 148/83 (!) 163/90  Pulse:  96 93  Resp:  19 20  Temp:   98.2 F (36.8 C)  TempSrc:   Oral  SpO2:  96% 100%  Weight: 106.4 kg 106.1 kg   Height:  6\' 1"  (1.854 m)      Body mass index is 30.87 kg/m.  Physical Exam   General: Laying comfortably in bed; in no acute distress.  HENT: Normal oropharynx and mucosa. Normal external appearance of ears and nose.  Neck: Supple, no pain or tenderness  CV: No JVD. No peripheral edema.  Pulmonary: Symmetric Chest rise. Normal respiratory effort.  Abdomen: Soft to touch, non-tender.  Ext: No cyanosis, edema, or deformity  Skin: No rash. Normal palpation of skin.   Musculoskeletal: Normal digits and nails by inspection. No clubbing.  Neurologic Examination  Mental status/Cognition: Alert, oriented to self, place, month and year, good attention.  Speech/language: Fluent, comprehension intact, object naming intact, repetition intact.  Cranial  nerves:   CN II Pupils equal and reactive to light, no VF deficits. Earlier had R visual field  extinction to bilateral stimuli. Left ptosis   CN III,IV,VI EOM intact, no gaze preference or deviation, no nystagmus   CN V normal sensation in V1, V2, and V3 segments bilaterally   CN VII no asymmetry, no nasolabial fold flattening   CN VIII Little hard of hearing but turns head to voice.   CN IX & X normal palatal elevation, no uvular deviation   CN XI 5/5 head turn and 5/5 shoulder shrug bilaterally   CN XII midline tongue protrusion   Motor:  Muscle bulk: normal, tone normal, pronator drift No tremor None Mvmt Root Nerve  Muscle Right Left Comments  SA C5/6 Ax Deltoid 5 5   EF C5/6 Mc Biceps 5 5   EE C6/7/8 Rad Triceps 5 5   WF C6/7 Med FCR     WE C7/8 PIN ECU     F Ab C8/T1 U ADM/FDI 5 5   HF L1/2/3 Fem Illopsoas 5 5   KE L2/3/4 Fem Quad 5 5   DF L4/5 D Peron Tib Ant 5 5   PF S1/2 Tibial Grc/Sol 5 5    Sensation:  Light touch intact   Pin prick    Temperature    Vibration   Proprioception    Coordination/Complex Motor:  - Finger to Nose intact - Heel to shin intact - Rapid alternating movement are normal - Gait: Deferred.  Labs   CBC:  Recent Labs  Lab 12/19/20 1217 12/19/20 1325  WBC 10.9*  --   NEUTROABS 8.8*  --   HGB 15.0 15.6  HCT 43.2 46.0  MCV 97.3  --   PLT 165  --     Basic Metabolic Panel:  Lab Results  Component Value Date   NA 137 12/19/2020   K 3.4 (L) 12/19/2020   CO2 23 11/30/2020   GLUCOSE 173 (H) 12/19/2020   BUN 12 12/19/2020   CREATININE 0.90 12/19/2020   CALCIUM 9.5 11/30/2020   GFRNONAA 78 02/04/2020   GFRAA 90 02/04/2020   Lipid Panel:  Lab Results  Component Value Date   LDLCALC 71 11/30/2020   HgbA1c:  Lab Results  Component Value Date   HGBA1C 7.0 (H) 11/30/2020   Urine Drug Screen: No results found for: LABOPIA, COCAINSCRNUR, LABBENZ, AMPHETMU, THCU, LABBARB  Alcohol Level No results found for: [place]  CT Head without contrast: CTH was negative for a large hypodensity concerning for a large territory infarct or  hyperdensity concerning for an ICH   CT angio Head and Neck with contrast: No LVO  MRI Brain: Pending  Impression   Edward Pittman is a 80 y.o. male with PMH significant for Afibb who ran out of Xarelto a week ago, hx of cardiomyopathy, HLD, HTN who presents with L gaze preference, R facial droop and R visual extinction noted on bilateral vision stimuli. He has been out of Xarelto for a week now. tPA offered and patient and wife agreeable to tPA.  Primary Diagnosis:  Cerebral infarction, unspecified.  Secondary Diagnosis: Essential (primary) hypertension and Paroxysmal atrial fibrillation  Recommendations   Stroke determined by clincal assessment(POA): tPA given in the Emergency Department: - Frequent NeuroChecks for post tPA care per stroke unit protocol: - Initial CTH demonstrated no acute hemorrhage or mass - MRI Brain - pending - CTA - no LVO - TTE - pending - Lipid Panel: LDL - pending  -  Statin: continue home  - HbA1c: pending - Antithrombotic: Start ASA 81 mg daily if 24 h MRI does not show acute hemorrhage - DVT prophylaxis: SCDs. Pharmacologic prophylaxis if 24 h MRI does not demonstrate acute hemorrhage - Systolic Blood Pressure goal: < 180 mm Hg - Telemetry monitoring for arrhythmia: 72 hours - Swallow screen - ordered - PT/OT/SLP consults   HLD(POA): - LDL pending - continue Simvastatin for now.   Afibb(POA): - hold off on Xarelto for now given he is s/p tPA - Will need to resume after 24 hours MRI.  HTN(POA): - SBP < 180 post tPA - Hold off on home AntiHTNsives ______________________________________________________________________  This patient is critically ill and at significant risk of neurological worsening, death and care requires constant monitoring of vital signs, hemodynamics,respiratory and cardiac monitoring, neurological assessment, discussion with family, other specialists and medical decision making of high complexity. I spent 60  minutes of neurocritical care time  in the care of  this patient. This was time spent independent of any time provided by nurse practitioner or PA.  Donnetta Simpers [facility] Pager Number [phone] 12/19/2020  1:53 PM   Thank you for the opportunity to take part in the care of this patient. If you have any further questions, please contact the neurology consultation attending.  Signed,  [place] Pager Number [phone] _ _ _   _ __   _ __ _ _  __ __   _ __   __ _

## 2020-12-19 NOTE — Progress Notes (Signed)
Pharmacist Code Stroke Response  Notified to mix tPA at 1228 by Dr. Lorrin Goodell Delivered tPA to RN at 1233  tPA dose = 9mg  bolus over 1 minute followed by 81mg  for a total dose of 90mg  over 1 hour  Issues/delays encountered (if applicable): none  Albertina Parr, PharmD., BCPS, BCCCP Clinical Pharmacist Please refer to [facility] for unit-specific pharmacist

## 2020-12-19 NOTE — Progress Notes (Addendum)
Neurology note:  Dr. Milas Gain was called by ED RN because patient attempted to vomit and had a vasovagal event losing consciousness for a few seconds and having bradycardia in the 50s.   NP to bedside urgently.   S: + nausea, dry heaving. + nausea this am before arrival and diarrhea 2 days ago. No HA. Does not feel weaker in any extremity. No vision changes.  O: Alert oriented, appears well. Facial droop is more prominent now than one hour ago. Last NIH for palsy was a 1 and now it is 2.  A/P: -Nausea with dry heaving, vasovagal response with LOC and bradycardia. Given worsening facial droop, will get stat CTH to r/o bleed because patient has received tPA.  -ED MD informed and wanted CT chest/abd/pelvis while in CT.   No bleed on [facility] confirmed with Dr. Milas Gain. Patient may go on up to ICU if ED MD does not need to hold him for findings on CT abd/pelvis/chest.   Clance Boll, NP Neurology

## 2020-12-19 NOTE — ED Provider Notes (Signed)
[facility] Provider Note   CSN: [id number] Arrival date & time: 12/19/20  1214     History Chief Complaint  Patient presents with   Weakness    R side weakness and facial droop, slurred speech.    Haleem Hanner Travieso is a 80 y.o. male.  HPI Patient with history of atrial fibrillation.  He has been off of his Xarelto for about a week due to running out of it.  He awakened in the morning and felt fine.  He then went back to rest and got up to go to the bathroom at about 11 AM.  He reports he was very dizzy and got extremely nauseated.  He asked his wife to call EMS.  She reports when EMS arrived he also started to get appearance of drooping on the right face.  Code stroke initiated.  Patient denies headache.  No chest pain or shortness of breath.  No syncope.    Past Medical History:  Diagnosis Date   Chronic systolic dysfunction of left ventricle    Dyslipidemia    HTN (hypertension)    Hx of adenomatous colonic polyps    Hx of colonoscopy    Keloid    Mild mitral regurgitation by prior echocardiogram    Nonischemic cardiomyopathy ([place])    Persistent atrial fibrillation [facility])    s/p ablation 4/16  Dr. Rayann Heman    Patient Active Problem List   Diagnosis Date Noted   Stroke determined by clinical assessment ([place]) 12/19/2020   Pain due to onychomycosis of toenails of both feet 11/30/2020   Chronic atrial fibrillation ([place]) 06/01/2019   Stable angina pectoris ([place]) 05/28/2019   Type 2 diabetes mellitus with complication, without long-term current use of insulin ([place]) 11/16/2017   BPH (benign prostatic hyperplasia) 12/12/2013   Routine general medical examination at a health care facility 12/12/2013   Cardiomyopathy, nonischemic ([place]) 08/09/2011   Hyperlipidemia with target LDL less than 100 07/14/2007   MITRAL REGURGITATION, MILD 07/14/2007   Essential hypertension 07/14/2007   Persistent atrial fibrillation ([place]) 07/14/2007    DEGENERATIVE JOINT DISEASE, HIPS 07/14/2007    Past Surgical History:  Procedure Laterality Date   ATRIAL FIBRILLATION ABLATION N/A 10/11/2011   Procedure: ATRIAL FIBRILLATION ABLATION;  Surgeon: Thompson Grayer, MD;  Location: [facility] CATH LAB;  Service: Cardiovascular;  Laterality: N/A;   COLONOSCOPY     FEMORAL HERNIA REPAIR     at age 72   HERNIA REPAIR     TEE WITHOUT CARDIOVERSION  10/10/2011   Procedure: TRANSESOPHAGEAL ECHOCARDIOGRAM (TEE);  Surgeon: Fay Records, MD;  Location: [facility] ENDOSCOPY;  Service: Cardiovascular;  Laterality: N/A;       Family History  Problem Relation Age of Onset   Brain cancer Mother    Breast cancer Mother    Diabetes Sister    Breast cancer Sister    Brain cancer Sister    Heart attack Father    Lung cancer Brother    Lung cancer Brother    Colon cancer Other        uncle   Prostate cancer Other        uncle   Diabetes Sister    Heart disease Neg Hx    Esophageal cancer Neg Hx    Rectal cancer Neg Hx    Stomach cancer Neg Hx     Social History   Tobacco Use   Smoking status: Former    Pack years: 0.00    Types: Cigarettes  Quit date: 08/09/1999    Years since quitting: 21.3   Smokeless tobacco: Never   Tobacco comments:    quit 9 years ago  Vaping Use   Vaping Use: Never used  Substance Use Topics   Alcohol use: No   Drug use: No    Home Medications Prior to Admission medications   Medication Sig Start Date End Date Taking? Authorizing Provider  dapagliflozin propanediol (FARXIGA) 10 MG TABS tablet Take 1 tablet (10 mg total) by mouth daily before breakfast. 12/01/20   [name], MD  diltiazem (CARDIZEM CD) 240 MG 24 hr capsule Take 1 capsule (240 mg total) by mouth daily. 12/01/20   [name], MD  furosemide (LASIX) 40 MG tablet Take 40 mg by mouth.    [provider]  latanoprost (XALATAN) 0.005 % ophthalmic solution INT 1 GTT IN OU QD IN THE EVE 10/09/18   [provider]  metoprolol tartrate  (LOPRESSOR) 100 MG tablet Take 1 tablet (100 mg total) by mouth 2 (two) times daily. NEED OV. 08/25/20   Minus Breeding, MD  rivaroxaban (XARELTO) 20 MG TABS tablet TAKE 1 TABLET(20 MG) BY MOUTH DAILY WITH SUPPER 12/17/20   Minus Breeding, MD  simvastatin (ZOCOR) 40 MG tablet TAKE 1 TABLET(40 MG) BY MOUTH DAILY AT 6 PM 09/01/20   Minus Breeding, MD    Allergies    Benazepril and Metformin and related  Review of Systems   Review of Systems 10 systems reviewed and negative except as per HPI Physical Exam Updated Vital Signs BP (!) 173/92   Pulse 70   Temp 98.2 F (36.8 C) (Oral) Comment: actual time 1336  Resp (!) 29   Ht 6\' 1"  (1.854 m)   Wt 106.1 kg   SpO2 97%   BMI 30.87 kg/m   Physical Exam Constitutional:      Comments: First exam on arrival, patient seems slightly somnolent but is responding to commands and trying to answer questions.  No respiratory distress  HENT:     Head: Normocephalic and atraumatic.     Mouth/Throat:     Pharynx: Oropharynx is clear.  Eyes:     Extraocular Movements: Extraocular movements intact.  Cardiovascular:     Rate and Rhythm: Rhythm irregular.  Pulmonary:     Effort: Pulmonary effort is normal.     Breath sounds: Normal breath sounds.  Abdominal:     General: There is no distension.     Palpations: Abdomen is soft.     Tenderness: There is no abdominal tenderness.  Musculoskeletal:        General: No swelling or deformity.     Cervical back: Neck supple.     Right lower leg: No edema.     Left lower leg: No edema.  Skin:    General: Skin is warm and dry.  Neurological:     Comments: Patient is mildly somnolent but is answering questions.  He is cooperating with following commands.  He is able to hold each lower extremity off the bed independently.  He is following commands for grip strength.  Right facial droop.    ED Results / Procedures / Treatments   Labs (all labs ordered are listed, but only abnormal results are  displayed) Labs Reviewed  PROTIME-INR - Abnormal; Notable for the following components:      Result Value   Prothrombin Time 16.3 (*)    INR 1.3 (*)    All other components within normal limits  CBC -  Abnormal; Notable for the following components:   WBC 10.9 (*)    All other components within normal limits  DIFFERENTIAL - Abnormal; Notable for the following components:   Neutro Abs 8.8 (*)    All other components within normal limits  COMPREHENSIVE METABOLIC PANEL - Abnormal; Notable for the following components:   Sodium 132 (*)    Potassium 3.3 (*)    Chloride 97 (*)    Glucose, Bld 179 (*)    AST 70 (*)    Alkaline Phosphatase 195 (*)    Total Bilirubin 1.5 (*)    All other components within normal limits  I-STAT CHEM 8, ED - Abnormal; Notable for the following components:   Potassium 3.4 (*)    Glucose, Bld 173 (*)    Calcium, Ion 1.04 (*)    TCO2 20 (*)    All other components within normal limits  CBG MONITORING, ED - Abnormal; Notable for the following components:   Glucose-Capillary 179 (*)    All other components within normal limits  SARS CORONAVIRUS 2 (TAT 6-24 HRS)  APTT    EKG EKG Interpretation  Date/Time:  Saturday December 19 2020 13:23:21 EDT Ventricular Rate:  98 PR Interval:    QRS Duration: 100 QT Interval:  348 QTC Calculation: 445 R Axis:   70 Text Interpretation: Atrial fibrillation Borderline repolarization abnormality agree, subtle inferior ST depression and lateral ST flattening  compared to 2013 tracing Confirmed by Charlesetta Shanks [phone]) on 12/19/2020 2:29:47 PM EKG not uploaded to epic. Radiology CT HEAD CODE STROKE WO CONTRAST  Result Date: 12/19/2020 CLINICAL DATA:  Code stroke.  Neuro deficit, acute stroke suspected. EXAM: CT HEAD WITHOUT CONTRAST TECHNIQUE: Contiguous axial images were obtained from the base of the skull through the vertex without intravenous contrast. COMPARISON:  None. FINDINGS: Brain: No evidence of acute large  vascular territory infarction, acute hemorrhage, hydrocephalus, extra-axial collection or mass lesion/mass effect. Patchy white matter hypoattenuation, most likely related to moderate chronic microvascular ischemic disease. Ossification along the falx. Vascular: No hyperdense vessel identified. Calcific intracranial atherosclerosis. Skull: No acute fracture. Sinuses/Orbits: Visualized sinuses are clear. No acute orbital abnormality. Other: No mastoid effusions. ASPECTS [facility] Stroke Program Early CT Score) Total score (0-10 with 10 being normal): 10. IMPRESSION: 1. No evidence of acute large vascular territory infarct or acute hemorrhage. ASPECTS is 10. 2. Moderate chronic microvascular ischemic disease. Code stroke imaging results were communicated on 12/19/2020 at 12:28 pm to provider [place] via telephone, who verbally acknowledged these results. Electronically Signed   By: Margaretha Sheffield MD   On: 12/19/2020 12:35   CT ANGIO HEAD CODE STROKE  Result Date: 12/19/2020 CLINICAL DATA:  Code stroke. Right-sided facial droop, numbness and slurred speech. EXAM: CT ANGIOGRAPHY HEAD AND NECK TECHNIQUE: Multidetector CT imaging of the head and neck was performed using the standard protocol during bolus administration of intravenous contrast. Multiplanar CT image reconstructions and MIPs were obtained to evaluate the vascular anatomy. Carotid stenosis measurements (when applicable) are obtained utilizing NASCET criteria, using the distal internal carotid diameter as the denominator. CONTRAST:  43mL OMNIPAQUE IOHEXOL 350 MG/ML SOLN COMPARISON:  Same day code stroke CT head FINDINGS: CTA NECK FINDINGS Aortic arch: Great vessel origins are patent. Calcific and noncalcific atherosclerosis of the visualized aortic arch. Right carotid system: No evidence of dissection, stenosis (50% or greater) or occlusion.Mild calcific and noncalcific atherosclerosis at the carotid bifurcation. Left carotid system: No evidence of  dissection, stenosis (50% or greater) or occlusion. Mild calcific and  noncalcific atherosclerosis at the carotid bifurcation. 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 Skeleton: Moderate multilevel cervical degenerative disease with disc height loss, endplate sclerosis and endplate spurring. Other neck: No acute abnormality. Upper chest: Visualized lung apices are clear. Review of the MIP images confirms the above findings CTA HEAD FINDINGS Anterior circulation: No definite large vessel occlusion or proximal hemodynamically significant stenosis. Hypoplastic or absent right A1 ACA, likely congenital. The right A2 ACA appears to arise from the left A1 ACA. No aneurysm identified. Posterior circulation: Diminutive opacification of a small intradural left vertebral artery. Right intradural vertebral artery is patent. Basilar artery is small with bilateral fetal type PCAs, anatomic variant. Bilateral PCAs are patent proximally without proximal flow limiting stenosis. The distal PCAs are poorly characterized due to venous contamination. No aneurysm identified. Venous sinuses: As permitted by contrast timing, patent. Anatomic variants: See above. Review of the MIP images confirms the above findings IMPRESSION: CTA head: 1. No emergent large vessel occlusion or proximal hemodynamically significant stenosis. 2. Limited evaluation of the distal vasculature due to venous contamination. 3. Small vertebrobasilar system with bilateral fetal type PCAs, anatomic variant. CTA Neck: 1. 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 2. Moderate to severe stenosis of the right vertebral artery origin with the  remainder of the right vertebral artery opacified in the neck. 3. Mild carotid bifurcation atherosclerosis without significant (greater than 50%) carotid stenosis in the neck. Findings discussed with Dr. Lorrin Goodell via telephone 1:29 p.m. Electronically Signed   By: Margaretha Sheffield MD   On: 12/19/2020 13:39   CT ANGIO NECK CODE STROKE  Result Date: 12/19/2020 CLINICAL DATA:  Code stroke. Right-sided facial droop, numbness and slurred speech. EXAM: CT ANGIOGRAPHY HEAD AND NECK TECHNIQUE: Multidetector CT imaging of the head and neck was performed using the standard protocol during bolus administration of intravenous contrast. Multiplanar CT image reconstructions and MIPs were obtained to evaluate the vascular anatomy. Carotid stenosis measurements (when applicable) are obtained utilizing NASCET criteria, using the distal internal carotid diameter as the denominator. CONTRAST:  5mL OMNIPAQUE IOHEXOL 350 MG/ML SOLN COMPARISON:  Same day code stroke CT head FINDINGS: CTA NECK FINDINGS Aortic arch: Great vessel origins are patent. Calcific and noncalcific atherosclerosis of the visualized aortic arch. Right carotid system: No evidence of dissection, stenosis (50% or greater) or occlusion.Mild calcific and noncalcific atherosclerosis at the carotid bifurcation. Left carotid system: No evidence of dissection, stenosis (50% or greater) or occlusion. Mild calcific and noncalcific atherosclerosis at the carotid bifurcation. 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 Skeleton: Moderate multilevel cervical degenerative disease with disc height loss, endplate sclerosis and endplate spurring. Other neck: No acute abnormality. Upper chest: Visualized lung apices are clear. Review of the MIP images  confirms the above findings CTA HEAD FINDINGS Anterior circulation: No definite large vessel occlusion or proximal hemodynamically significant stenosis. Hypoplastic or absent right A1 ACA, likely congenital. The right A2 ACA appears to arise from the left A1 ACA. No aneurysm identified. Posterior circulation: Diminutive opacification of a small intradural  left vertebral artery. Right intradural vertebral artery is patent. Basilar artery is small with bilateral fetal type PCAs, anatomic variant. Bilateral PCAs are patent proximally without proximal flow limiting stenosis. The distal PCAs are poorly characterized due to venous contamination. No aneurysm identified. Venous sinuses: As permitted by contrast timing, patent. Anatomic variants: See above. Review of the MIP images confirms the above findings IMPRESSION: CTA head: 1. No emergent large vessel occlusion or proximal hemodynamically significant stenosis. 2. Limited evaluation of the distal vasculature due to venous contamination. 3. Small vertebrobasilar system with bilateral fetal type PCAs, anatomic variant. CTA Neck: 1. 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 2. 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 3. Mild carotid bifurcation atherosclerosis without significant (greater than 50%) carotid stenosis in the neck. Findings discussed with Dr. Lorrin Goodell via telephone 1:29 p.m. Electronically Signed   By: Margaretha Sheffield MD   On: 12/19/2020 13:39    Procedures Procedures  CRITICAL CARE Performed by: Charlesetta Shanks   Total critical care time: 30 minutes  Critical care time was exclusive of separately billable procedures and treating other patients.  Critical care was necessary to treat or prevent imminent or life-threatening deterioration.  Critical  care was time spent personally by me on the following activities: development of treatment plan with patient and/or surrogate as well as nursing, discussions with consultants, evaluation of patient's response to treatment, examination of patient, obtaining history from patient or surrogate, ordering and performing treatments and interventions, ordering and review of laboratory studies, ordering and review of radiographic studies, pulse oximetry and re-evaluation of patient's condition.  Medications Ordered in ED Medications  sodium chloride flush (NS) 0.9 % injection 3 mL (has no administration in time range)   stroke: mapping our early stages of recovery book (has no administration in time range)  acetaminophen (TYLENOL) tablet 650 mg (has no administration in time range)    Or  acetaminophen (TYLENOL) 160 MG/5ML solution 650 mg (has no administration in time range)    Or  acetaminophen (TYLENOL) suppository 650 mg (has no administration in time range)  senna-docusate (Senokot-S) tablet 1 tablet (has no administration in time range)  pantoprazole (PROTONIX) injection 40 mg (has no administration in time range)  simvastatin (ZOCOR) tablet 40 mg (has no administration in time range)  alteplase (ACTIVASE) 1 mg/mL infusion 90 mg (0 mg Intravenous Stopped 12/19/20 1340)    Followed by  0.9 %  sodium chloride infusion (0 mLs Intravenous Stopped 12/19/20 1423)  iohexol (OMNIPAQUE) 350 MG/ML injection 75 mL (75 mLs Intravenous Contrast Given 12/19/20 1315)    ED Course  I have reviewed the triage vital signs and the nursing notes.  Pertinent labs & imaging results that were available during my care of the patient were reviewed by me and considered in my medical decision making (see chart for details).    MDM Rules/Calculators/A&P                          Patient recheck after tPA initiated.  He was doing well.  Patient had clear mental status and was answering questions.  Grip strength symmetric  bilaterally.  Patient can elevate each lower extremity off of the bed.  Patient did have significant facial paralysis on the right.  Mental status much more alert than upon  arrival.  About 30 minutes after recheck, nursing staff summoned for assistance.  Patient had become nauseated and was trying to vomit.  He had vagal episode with drop in heart rate.  As I entered the room heart rate was picking up from a slow atrial fibrillation in the 30s and 40s.  This continued to rebound and patient remained heart rates ranging from 70s to low 100s atrial fibrillation.  No chest pain associated with this episode.  No headache.  EKG obtained shows controlled rate atrial fib without acute ischemic pattern.  Patient will continue to get close observation.  He is admitted to neurology service for CVA getting tPA. Final Clinical Impression(s) / ED Diagnoses Final diagnoses:  Cerebrovascular accident (CVA), unspecified mechanism ([place])  Chronic a-fib ([place])  Vasovagal episode    Rx / DC Orders ED Discharge Orders     None        Charlesetta Shanks, MD 12/19/20 1435

## 2020-12-19 NOTE — ED Triage Notes (Signed)
Patient to ED by EMS for s/s stroke. R side facial droop and weakness. Slurred speech.

## 2020-12-19 NOTE — ED Notes (Signed)
Patient became nauseated and attempted to vomit with out actually vomiting and vagaled with HR in 50s and became unresponsive for a few seconds. But now at baseline.

## 2020-12-20 ENCOUNTER — Inpatient Hospital Stay (HOSPITAL_COMMUNITY): Payer: Medicare HMO

## 2020-12-20 DIAGNOSIS — I482 Chronic atrial fibrillation, unspecified: Secondary | ICD-10-CM

## 2020-12-20 DIAGNOSIS — E7800 Pure hypercholesterolemia, unspecified: Secondary | ICD-10-CM

## 2020-12-20 DIAGNOSIS — I255 Ischemic cardiomyopathy: Secondary | ICD-10-CM

## 2020-12-20 DIAGNOSIS — R55 Syncope and collapse: Secondary | ICD-10-CM

## 2020-12-20 DIAGNOSIS — I63412 Cerebral infarction due to embolism of left middle cerebral artery: Secondary | ICD-10-CM

## 2020-12-20 LAB — LIPID PANEL
Cholesterol: 125 mg/dL (ref 0–200)
HDL: 34 mg/dL — ABNORMAL LOW (ref >40)
LDL Cholesterol: 83 mg/dL (ref 0–99)
Total CHOL/HDL Ratio: 3.7 RATIO
Triglycerides: 40 mg/dL (ref <150)
VLDL: 8 mg/dL (ref 0–40)

## 2020-12-20 LAB — CBC
HCT: 44.6 % (ref 39.0–52.0)
Hemoglobin: 15.4 g/dL (ref 13.0–17.0)
MCH: 33.2 pg (ref 26.0–34.0)
MCHC: 34.5 g/dL (ref 30.0–36.0)
MCV: 96.1 fL (ref 80.0–100.0)
Platelets: 179 10*3/uL (ref 150–400)
RBC: 4.64 MIL/uL (ref 4.22–5.81)
RDW: 12.8 % (ref 11.5–15.5)
WBC: 13.8 10*3/uL — ABNORMAL HIGH (ref 4.0–10.5)
nRBC: 0 % (ref 0.0–0.2)

## 2020-12-20 LAB — BASIC METABOLIC PANEL
Anion gap: 9 (ref 5–15)
BUN: 15 mg/dL (ref 8–23)
CO2: 24 mmol/L (ref 22–32)
Calcium: 9.4 mg/dL (ref 8.9–10.3)
Chloride: 102 mmol/L (ref 98–111)
Creatinine, Ser: 0.97 mg/dL (ref 0.61–1.24)
GFR, Estimated: >60 mL/min (ref >60)
Glucose, Bld: 153 mg/dL — ABNORMAL HIGH (ref 70–99)
Potassium: 3.7 mmol/L (ref 3.5–5.1)
Sodium: 135 mmol/L (ref 135–145)

## 2020-12-20 MED ORDER — METOPROLOL TARTRATE 50 MG PO TABS
100.0000 mg | ORAL_TABLET | Freq: Two times a day (BID) | ORAL | Status: DC
  Administered 2020-12-20 – 2020-12-22 (×4): 100 mg via ORAL
  Filled 2020-12-20 (×3): qty 2
  Filled 2020-12-20: qty 4

## 2020-12-20 MED ORDER — ATORVASTATIN CALCIUM 40 MG PO TABS: 40.0000 mg | ORAL_TABLET | Freq: Every day | ORAL | Status: DC

## 2020-12-20 MED ORDER — DAPAGLIFLOZIN PROPANEDIOL 10 MG PO TABS
10.0000 mg | ORAL_TABLET | Freq: Every day | ORAL | Status: DC
  Administered 2020-12-21 – 2020-12-22 (×2): 10 mg via ORAL
  Filled 2020-12-20 (×2): qty 1

## 2020-12-20 MED ORDER — ATORVASTATIN CALCIUM 40 MG PO TABS
40.0000 mg | ORAL_TABLET | Freq: Every day | ORAL | Status: DC
  Administered 2020-12-20 – 2020-12-22 (×3): 40 mg via ORAL
  Filled 2020-12-20 (×3): qty 1

## 2020-12-20 MED ORDER — B COMPLEX-C PO TABS
1.0000 | ORAL_TABLET | Freq: Every day | ORAL | Status: DC
  Administered 2020-12-20 – 2020-12-22 (×3): 1 via ORAL
  Filled 2020-12-20 (×3): qty 1

## 2020-12-20 MED ORDER — ONDANSETRON HCL 4 MG/2ML IJ SOLN
4.0000 mg | Freq: Four times a day (QID) | INTRAMUSCULAR | Status: DC | PRN
  Administered 2020-12-20: 4 mg via INTRAVENOUS
  Filled 2020-12-20: qty 2

## 2020-12-20 MED ORDER — B COMPLETE PO TABS: 1.0000 | ORAL_TABLET | Freq: Every day | ORAL | Status: DC

## 2020-12-20 MED ORDER — RIVAROXABAN 20 MG PO TABS
20.0000 mg | ORAL_TABLET | Freq: Every day | ORAL | Status: DC
  Administered 2020-12-20 – 2020-12-21 (×2): 20 mg via ORAL
  Filled 2020-12-20 (×3): qty 1

## 2020-12-20 MED ORDER — DILTIAZEM HCL ER COATED BEADS 240 MG PO CP24
240.0000 mg | ORAL_CAPSULE | Freq: Every day | ORAL | Status: DC
  Administered 2020-12-20 – 2020-12-22 (×3): 240 mg via ORAL
  Filled 2020-12-20 (×3): qty 1

## 2020-12-20 MED ORDER — LATANOPROST 0.005 % OP SOLN
1.0000 [drp] | Freq: Every day | OPHTHALMIC | Status: DC
  Administered 2020-12-20 – 2020-12-21 (×2): 1 [drp] via OPHTHALMIC
  Filled 2020-12-20: qty 2.5

## 2020-12-20 MED ORDER — PANTOPRAZOLE SODIUM 40 MG PO TBEC
40.0000 mg | DELAYED_RELEASE_TABLET | Freq: Every day | ORAL | Status: DC
  Administered 2020-12-20 – 2020-12-22 (×3): 40 mg via ORAL
  Filled 2020-12-20 (×3): qty 1

## 2020-12-20 MED ORDER — METOPROLOL TARTRATE 25 MG PO TABS
25.0000 mg | ORAL_TABLET | Freq: Two times a day (BID) | ORAL | Status: DC
  Administered 2020-12-20: 25 mg via ORAL
  Filled 2020-12-20: qty 1

## 2020-12-20 NOTE — Evaluation (Signed)
Physical Therapy Evaluation Patient Details Name: Edward Pittman MRN: [id number] DOB: 1941/01/11 Today's Date: 12/20/2020   History of Present Illness  This 80 y.o. male admitted 6/24 with new onset dizziness and Rt sided weakness and facial droop.  Pt received tPA.  CT of head negative for intracranial abnormality, CTA > no LVO.  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 CT of abdomen and pelvis showed infarcts of bil. superior and inferior renal poles.  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 PTA, he was independent and living with his wife in a 1-level house with 2-3 STE with bil handrails. 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 Currently, pt displays coordination deficits in all 4 extremities (L leg appeared worse than R), balance impairments, spontaneous bouts of dizziness, and decreased activity tolerance. Pt is currently requiring supervision for bed mobility but modAx2 with UE support to transfer to stand and ambulate short distances within his room as he has spontaneous posterior bouts of LOB. He is at high risk for falls. Pt would greatly benefit from intensive therapy in the CIR setting to maximize his safety and independence with all functional mobility prior to return home. Will continue to follow acutely.    Follow Up Recommendations CIR;Supervision for mobility/OOB    Equipment Recommendations  Rolling walker with 5" wheels;3in1 (PT)    Recommendations for Other Services Rehab consult     Precautions / Restrictions Precautions Precautions: Fall Precaution Comments: very unsteady, easily dizzy Restrictions Weight Bearing  Restrictions: No      Mobility  Bed Mobility Overal bed mobility: Needs Assistance Bed Mobility: Supine to Sit;Sit to Supine     Supine to sit: Supervision Sit to supine: Supervision   General bed mobility comments: Bed flat with rails down to simulate home, pt able to perform all bed mobility safely with supervision.    Transfers Overall transfer level: Needs assistance Equipment used: 1 person hand held assist Transfers: Sit to/from Stand Sit to Stand: Mod assist;+2 physical assistance;+2 safety/equipment         General transfer comment: Initially did not provide pt a hand for transfer to assess his independence, but pt began to have significant posterior lean with bil forefeet lifting off ground, needing modAx2 and R HHA to gain balance to power up fully to stand.  Ambulation/Gait Ambulation/Gait assistance: Mod assist;+2 physical assistance;+2 safety/equipment Gait Distance (Feet): 15 Feet Assistive device: 2 person hand held assist Gait Pattern/deviations: Step-through pattern;Decreased stride length;Leaning posteriorly Gait velocity: reduced Gait velocity interpretation: <1.8 ft/sec, indicate of risk for recurrent falls General Gait Details: Pt needing bil HHA and modAx2 to maintain his balance and safety with mobility as he would have spontaneous posterior LOB bouts. Pt needing cues to shift his weight anteriorly onto his forefeet. Pt dizzy with turns.  Stairs            Wheelchair Mobility    Modified Rankin (Stroke Patients Only) Modified Rankin (Stroke Patients Only) Pre-Morbid Rankin Score: No symptoms Modified Rankin: Moderately severe disability     Balance Overall balance assessment: Needs assistance Sitting-balance support: No upper extremity supported;Feet supported Sitting balance-Leahy Scale: Good Sitting balance - Comments: Able to reach down to  ground to donn sock without LOB, supervision for safety. Postural control: Posterior  lean Standing balance support: Bilateral upper extremity supported;During functional activity Standing balance-Leahy Scale: Poor Standing balance comment: Pt with posterior LOB bouts, needing bil HHA modAx2.                             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, newspaper        Extremity/Trunk Assessment   Upper Extremity Assessment Upper Extremity Assessment: Defer to OT evaluation    Lower Extremity Assessment Lower Extremity Assessment: RLE deficits/detail;LLE deficits/detail RLE Deficits / Details: MMT scores of 5 grossly; denies numbness/tingling; dysdiadochokinesia noted (more on L than R) RLE Sensation: WNL RLE Coordination: decreased gross motor;decreased fine motor (dysdiadochokinesia noted) LLE Deficits / Details: MMT scores of 5 grossly; denies numbness/tingling; dysdiadochokinesia noted (more on L than R) LLE Sensation: WNL LLE Coordination: decreased fine motor;decreased gross motor (dysdiadochokinesia noted)    Cervical / Trunk Assessment Cervical / Trunk Assessment: Normal  Communication   Communication: No difficulties  Cognition Arousal/Alertness: Awake/alert Behavior During Therapy: WFL for tasks assessed/performed Overall Cognitive Status: Within Functional Limits for tasks assessed                                         General Comments General comments (skin integrity, edema, etc.): Hx of R eye glaucoma    Exercises     Assessment/Plan    PT Assessment Patient needs continued PT services  PT Problem List Decreased activity tolerance;Decreased balance;Decreased mobility;Decreased coordination;Decreased safety awareness;Decreased knowledge of use of DME       PT Treatment Interventions DME instruction;Gait training;Stair training;Functional mobility training;Therapeutic activities;Therapeutic exercise;Balance training;Neuromuscular re-education;Patient/family education    PT Goals (Current goals can be found in the Care Plan section)  Acute Rehab PT Goals Patient Stated Goal: to improve PT Goal Formulation: With patient/family Time For Goal Achievement: 01/03/21 Potential to Achieve Goals: Good    Frequency Min 4X/week   Barriers to discharge        Co-evaluation PT/OT/SLP Co-Evaluation/Treatment: Yes Reason for Co-Treatment: For patient/therapist safety;To address functional/ADL transfers PT goals addressed during session: Mobility/safety with mobility;Balance         AM-PAC PT "6 Clicks" Mobility  Outcome Measure Help needed turning from your back to your side while in a flat bed without using bedrails?: A Little Help needed moving from lying on your back to sitting on the side of a flat bed without using bedrails?: A Little Help needed moving to and from a bed to a chair (including a wheelchair)?: Total Help needed standing up from a chair using your arms (e.g., wheelchair or bedside chair)?: Total Help needed to walk in hospital room?: Total Help needed climbing 3-5 steps with a railing? :  Total 6 Click Score: 10    End of Session Equipment Utilized During Treatment: Gait belt Activity Tolerance: Patient tolerated treatment well Patient left: in bed;with call bell/phone within reach;with family/visitor present;with nursing/sitter in room (getting ready to  transport to MRI) Nurse Communication: Mobility status PT Visit Diagnosis: Unsteadiness on feet (R26.81);Other abnormalities of gait and mobility (R26.89);Difficulty in walking, not elsewhere classified (R26.2);Other symptoms and signs involving the nervous system (R29.898);Dizziness and giddiness (R42)    Time: 7670-1100 PT Time Calculation (min) (ACUTE ONLY): 20 min   Charges:   PT Evaluation $PT Eval Moderate Complexity: 1 Mod          Moishe Spice, PT, DPT Acute Rehabilitation Services  Pager: [phone] Office: [phone]   Orvan Falconer 12/20/2020, 1:03 PM

## 2020-12-20 NOTE — Progress Notes (Signed)
Inpatient Rehab Admissions Coordinator Note:   Per therapy recommendations, pt was screened for CIR candidacy by Jena Tegeler, MS CCC-SLP. At this time, Pt. Appears to have functional decline and is a potential  candidate for CIR. Will place order for rehab consult per protocol.  Please contact me with questions.   Glennda Weatherholtz, MS, CCC-SLP Rehab Admissions Coordinator  [phone] (celll) [phone] (office)  

## 2020-12-20 NOTE — Progress Notes (Signed)
STROKE TEAM PROGRESS NOTE   SUBJECTIVE (INTERVAL HISTORY) His wife and daughter and RN are at the bedside.  Overall his condition is stable. Pt sitting in bed, AAO x3, no complains. Still has mild right facial droop but UEs and LEs equal strength. HR 90s-120s, will resume some home meds. MRI pending.   Pt had several episode of nausea and dry heaving and then bradycardia to 30s. Yesterday episode had brief LOC. No on zofran PRN. Per family, pt has not been eating for the last 2 days. Now resume diet and hopefully that will help for the nausea.    OBJECTIVE Temp:  [98.2 F (36.8 C)-99.1 F (37.3 C)] 99.1 F (37.3 C) (06/26 0800) Pulse Rate:  [70-111] 102 (06/26 0900) Resp:  [0-34] 24 (06/26 0900) BP: (134-173)/(81-112) 154/90 (06/26 0900) SpO2:  [90 %-100 %] 92 % (06/26 0900) Weight:  [106.1 kg-106.4 kg] 106.1 kg (06/25 1337)  Recent Labs  Lab 12/19/20 1221  GLUCAP 179*   Recent Labs  Lab 12/19/20 1217 12/19/20 1325  NA 132* 137  K 3.3* 3.4*  CL 97* 101  CO2 22  --   GLUCOSE 179* 173*  BUN 12 12  CREATININE 1.12 0.90  CALCIUM 9.2  --    Recent Labs  Lab 12/19/20 1217  AST 70*  ALT 42  ALKPHOS 195*  BILITOT 1.5*  PROT 7.4  ALBUMIN 3.9   Recent Labs  Lab 12/19/20 1217 12/19/20 1325 12/20/20 0923  WBC 10.9*  --  13.8*  NEUTROABS 8.8*  --   --   HGB 15.0 15.6 15.4  HCT 43.2 46.0 44.6  MCV 97.3  --  96.1  PLT 165  --  179   No results for input(s): CKTOTAL, CKMB, CKMBINDEX, TROPONINI in the last 168 hours. Recent Labs    12/19/20 1217  LABPROT 16.3*  INR 1.3*   No results for input(s): COLORURINE, LABSPEC, PHURINE, GLUCOSEU, HGBUR, BILIRUBINUR, KETONESUR, PROTEINUR, UROBILINOGEN, NITRITE, LEUKOCYTESUR in the last 72 hours.  Invalid input(s): APPERANCEUR     Component Value Date/Time   CHOL 125 12/20/2020 0235   TRIG 40 12/20/2020 0235   HDL 34 (L) 12/20/2020 0235   CHOLHDL 3.7 12/20/2020 0235   VLDL 8 12/20/2020 0235   LDLCALC 83 12/20/2020 0235    Lab Results  Component Value Date   HGBA1C 7.0 (H) 11/30/2020   No results found for: LABOPIA, COCAINSCRNUR, LABBENZ, AMPHETMU, THCU, LABBARB  No results for input(s): ETH in the last 168 hours.  I have personally reviewed the radiological images below and agree with the radiology interpretations.  CT HEAD WO CONTRAST  Result Date: 12/19/2020 CLINICAL DATA:  Stroke follow-up, nausea vomiting post tPA. Worsened facial droop. EXAM: CT HEAD WITHOUT CONTRAST TECHNIQUE: Contiguous axial images were obtained from the base of the skull through the vertex without intravenous contrast. COMPARISON:  CT angiography head 12/19/2020, CT head 12/19/2020 1:35 p.m. 5 FINDINGS: Brain: Cerebral ventricle sizes are concordant with the degree of cerebral volume loss. No evidence of large-territorial acute infarction. No parenchymal hemorrhage. No mass lesion. No extra-axial collection. No mass effect or midline shift. No hydrocephalus. Basilar cisterns are patent. Vascular: No hyperdense vessel. Atherosclerotic calcifications are present within the cavernous internal carotid arteries. Skull: No acute fracture or focal lesion. Sinuses/Orbits: Paranasal sinuses and mastoid air cells are clear. The orbits are unremarkable. Other: None. IMPRESSION: No acute intracranial abnormality. Electronically Signed   By: Iven Finn M.D.   On: 12/19/2020 15:05   CT CHEST W CONTRAST  Result Date: 12/19/2020 CLINICAL DATA:  Nausea/vomiting, diarrhea.  Stroke. EXAM: CT CHEST, ABDOMEN, AND PELVIS WITH CONTRAST TECHNIQUE: Multidetector CT imaging of the chest, abdomen and pelvis was performed following the standard protocol during bolus administration of intravenous contrast. CONTRAST:  64mL OMNIPAQUE IOHEXOL 350 MG/ML SOLN COMPARISON:  None. FINDINGS: CT CHEST FINDINGS Cardiovascular: Enlarged bilateral atria. No significant pericardial effusion. The thoracic aorta is normal in caliber. Mild atherosclerotic plaque of the thoracic  aorta. Four-vessel coronary artery calcifications. Mediastinum/Nodes: No enlarged mediastinal, hilar, or axillary lymph nodes. Thyroid gland, trachea, and esophagus demonstrate no significant findings. Lungs/Pleura: No focal consolidation. No pulmonary nodule. No pulmonary mass. No pleural effusion or pneumothorax. Musculoskeletal: No chest wall abnormality. No suspicious lytic or blastic osseous lesions. No acute displaced fracture. Multilevel degenerative changes of the spine. CT ABDOMEN PELVIS FINDINGS Hepatobiliary: No focal liver abnormality. No gallstones, gallbladder wall thickening, or pericholecystic fluid. No biliary dilatation. Pancreas: No focal lesion. Normal pancreatic contour. No surrounding inflammatory changes. No main pancreatic ductal dilatation. Spleen: Normal in size without focal abnormality. Adrenals/Urinary Tract: No adrenal nodule bilaterally. The right kidney enhances homogeneous Lea. The left kidney demonstrates both superior and inferior pole hypodense wedge-shaped densities. Subcentimeter hypodensities are too small to characterize. No hydronephrosis. No hydroureter. The urinary bladder is unremarkable. On delayed imaging, there is no urothelial wall thickening and there are no filling defects in the opacified portions of the bilateral collecting systems or ureters. Stomach/Bowel: Stomach is within normal limits. No evidence of bowel wall thickening or dilatation. Appendix appears normal. Vascular/Lymphatic: At least moderate to severe calcified and noncalcified atherosclerotic plaque. No abdominal aorta. Aneurysmal dilatation of the proximal left common iliac artery measuring up to 1.7 cm extending for approximately 2 cm in the craniocaudal dimension with associated large volume eccentric noncalcified plaque. Mild atherosclerotic plaque of the aorta and its branches. No abdominal, pelvic, or inguinal lymphadenopathy. Reproductive: Prostate is unremarkable. Other: No intraperitoneal free  fluid. No intraperitoneal free gas. No organized fluid collection. Musculoskeletal: No abdominal wall hernia or abnormality. Cortical thickening and diffuse sclerotic appearance of the pelvis, right greater than left. No acute displaced fracture. Multilevel degenerative changes of the spine. IMPRESSION: 1. 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 2. 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 3. Aortic Atherosclerosis (ICD10-I70.0) including four-vessel coronary artery calcifications. 4. Cardiomegaly. 5. Diffuse sclerotic pelvic appearance suggestive of Paget's disease. Recommend correlation with prior x-ray cross-sectional imaging of the pelvis. Electronically Signed   By: Iven Finn M.D.   On: 12/19/2020 15:32   CT ABDOMEN PELVIS W CONTRAST  Result Date: 12/19/2020 CLINICAL DATA:  Nausea/vomiting, diarrhea.  Stroke. EXAM: CT CHEST, ABDOMEN, AND PELVIS WITH CONTRAST TECHNIQUE: Multidetector CT imaging of the chest, abdomen and pelvis was performed following the standard protocol during bolus administration of intravenous contrast. CONTRAST:  42mL OMNIPAQUE IOHEXOL 350 MG/ML SOLN COMPARISON:  None. FINDINGS: CT CHEST FINDINGS Cardiovascular: Enlarged bilateral atria. No significant pericardial effusion. The thoracic aorta is normal in caliber. Mild atherosclerotic plaque of the thoracic aorta. Four-vessel coronary artery calcifications. Mediastinum/Nodes: No enlarged mediastinal, hilar, or axillary lymph nodes. Thyroid gland, trachea, and esophagus demonstrate no significant findings. Lungs/Pleura: No focal consolidation. No pulmonary nodule. No pulmonary mass. No pleural effusion or pneumothorax. Musculoskeletal: No chest wall abnormality. No suspicious lytic or blastic osseous lesions. No acute displaced fracture. Multilevel degenerative changes of the spine. CT ABDOMEN PELVIS FINDINGS Hepatobiliary: No focal  liver abnormality. No gallstones, gallbladder wall thickening,  or pericholecystic fluid. No biliary dilatation. Pancreas: No focal lesion. Normal pancreatic contour. No surrounding inflammatory changes. No main pancreatic ductal dilatation. Spleen: Normal in size without focal abnormality. Adrenals/Urinary Tract: No adrenal nodule bilaterally. The right kidney enhances homogeneous Lea. The left kidney demonstrates both superior and inferior pole hypodense wedge-shaped densities. Subcentimeter hypodensities are too small to characterize. No hydronephrosis. No hydroureter. The urinary bladder is unremarkable. On delayed imaging, there is no urothelial wall thickening and there are no filling defects in the opacified portions of the bilateral collecting systems or ureters. Stomach/Bowel: Stomach is within normal limits. No evidence of bowel wall thickening or dilatation. Appendix appears normal. Vascular/Lymphatic: At least moderate to severe calcified and noncalcified atherosclerotic plaque. No abdominal aorta. Aneurysmal dilatation of the proximal left common iliac artery measuring up to 1.7 cm extending for approximately 2 cm in the craniocaudal dimension with associated large volume eccentric noncalcified plaque. Mild atherosclerotic plaque of the aorta and its branches. No abdominal, pelvic, or inguinal lymphadenopathy. Reproductive: Prostate is unremarkable. Other: No intraperitoneal free fluid. No intraperitoneal free gas. No organized fluid collection. Musculoskeletal: No abdominal wall hernia or abnormality. Cortical thickening and diffuse sclerotic appearance of the pelvis, right greater than left. No acute displaced fracture. Multilevel degenerative changes of the spine. IMPRESSION: 1. 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 2. 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 3. Aortic Atherosclerosis (ICD10-I70.0)  including four-vessel coronary artery calcifications. 4. Cardiomegaly. 5. Diffuse sclerotic pelvic appearance suggestive of Paget's disease. Recommend correlation with prior x-ray cross-sectional imaging of the pelvis. Electronically Signed   By: Iven Finn M.D.   On: 12/19/2020 15:32   ECHOCARDIOGRAM COMPLETE  Result Date: 12/19/2020    ECHOCARDIOGRAM REPORT   Patient Name:   EDWAR COE Hoheisel Date of Exam: 12/19/2020 Medical Rec #:  [id number]        Height:       73.0 in Accession #:    [id number]       Weight:       234.0 lb Date of Birth:  1940-09-02        BSA:          2.300 m Patient Age:    34 years         BP:           165/85 mmHg Patient Gender: M                HR:           96 bpm. Exam Location:  Inpatient Procedure: 2D Echo, Cardiac Doppler, Color Doppler and Intracardiac            Opacification Agent Indications:    Stroke  History:        Patient has prior history of Echocardiogram examinations, most                 recent 03/12/2012. Arrythmias:Atrial Fibrillation; Risk                 Factors:Dyslipidemia and Hypertension. S/P ablation. Hx NICM.  Sonographer:    Clayton Lefort RDCS (AE) Referring Phys: [id number] [place]  1. Left ventricular ejection fraction, by estimation, is 60 to 65%. The left ventricle has normal function. The left ventricle has no regional wall motion abnormalities. There is mild concentric left ventricular hypertrophy. Left ventricular diastolic function could not be evaluated.  2. Right ventricular systolic function is mildly reduced. The  right ventricular size is mildly enlarged.  3. Left atrial size was mildly dilated.  4. Right atrial size was mildly dilated.  5. The mitral valve is grossly normal. Mild mitral valve regurgitation. No evidence of mitral stenosis.  6. The aortic valve is tricuspid. There is mild calcification of the aortic valve. Aortic valve regurgitation is not visualized. Mild aortic valve sclerosis is present, with no evidence of  aortic valve stenosis. Conclusion(s)/Recommendation(s): No intracardiac source of embolism detected on this transthoracic study. A transesophageal echocardiogram is recommended to exclude cardiac source of embolism if clinically indicated. FINDINGS  Left Ventricle: Left ventricular ejection fraction, by estimation, is 60 to 65%. The left ventricle has normal function. The left ventricle has no regional wall motion abnormalities. Definity contrast agent was given IV to delineate the left ventricular  endocardial borders. The left ventricular internal cavity size was normal in size. There is mild concentric left ventricular hypertrophy. Left ventricular diastolic function could not be evaluated due to atrial fibrillation. Left ventricular diastolic function could not be evaluated. Right Ventricle: The right ventricular size is mildly enlarged. No increase in right ventricular wall thickness. Right ventricular systolic function is mildly reduced. Left Atrium: Left atrial size was mildly dilated. Right Atrium: Right atrial size was mildly dilated. Pericardium: There is no evidence of pericardial effusion. Mitral Valve: The mitral valve is grossly normal. Mild mitral valve regurgitation. No evidence of mitral valve stenosis. Tricuspid Valve: The tricuspid valve is grossly normal. Tricuspid valve regurgitation is mild . No evidence of tricuspid stenosis. Aortic Valve: The aortic valve is tricuspid. There is mild calcification of the aortic valve. Aortic valve regurgitation is not visualized. Mild aortic valve sclerosis is present, with no evidence of aortic valve stenosis. Aortic valve mean gradient measures 4.6 mmHg. Aortic valve peak gradient measures 7.4 mmHg. Aortic valve area, by VTI measures 2.27 cm. Pulmonic Valve: The pulmonic valve was grossly normal. Pulmonic valve regurgitation is not visualized. No evidence of pulmonic stenosis. Aorta: The aortic root and ascending aorta are structurally normal, with no  evidence of dilitation. Venous: The inferior vena cava was not well visualized. IAS/Shunts: The atrial septum is grossly normal.  LEFT VENTRICLE PLAX 2D LVIDd:         5.20 cm LVIDs:         3.20 cm LV PW:         1.20 cm LV IVS:        1.20 cm LVOT diam:     2.10 cm LV SV:         58 LV SV Index:   25 LVOT Area:     3.46 cm  RIGHT VENTRICLE RV Basal diam:  4.10 cm RV Mid diam:    3.70 cm RV S prime:     10.40 cm/s TAPSE (M-mode): 1.6 cm LEFT ATRIUM              Index       RIGHT ATRIUM           Index LA diam:        4.60 cm  2.00 cm/m  RA Area:     37.80 cm LA Vol (A2C):   84.6 ml  36.78 ml/m RA Volume:   142.00 ml 61.74 ml/m LA Vol (A4C):   102.3 ml 44.45 ml/m LA Biplane Vol: 94.3 ml  41.00 ml/m  AORTIC VALVE AV Area (Vmax):    2.28 cm AV Area (Vmean):   2.18 cm AV Area (VTI):  2.27 cm AV Vmax:           136.40 cm/s AV Vmean:          101.100 cm/s AV VTI:            0.257 m AV Peak Grad:      7.4 mmHg AV Mean Grad:      4.6 mmHg LVOT Vmax:         89.60 cm/s LVOT Vmean:        63.680 cm/s LVOT VTI:          0.168 m LVOT/AV VTI ratio: 0.65  AORTA Ao Root diam: 3.80 cm Ao Asc diam:  3.60 cm MR Peak grad: 104.9 mmHg  TRICUSPID VALVE MR Mean grad: 74.0 mmHg   TR Peak grad:   32.0 mmHg MR Vmax:      512.00 cm/s TR Vmax:        283.00 cm/s MR Vmean:     411.0 cm/s                           SHUNTS                           Systemic VTI:  0.17 m                           Systemic Diam: 2.10 cm Eleonore Chiquito MD Electronically signed by Eleonore Chiquito MD Signature Date/Time: 12/19/2020/4:39:55 PM    Final    CT HEAD CODE STROKE WO CONTRAST  Result Date: 12/19/2020 CLINICAL DATA:  Code stroke.  Neuro deficit, acute stroke suspected. EXAM: CT HEAD WITHOUT CONTRAST TECHNIQUE: Contiguous axial images were obtained from the base of the skull through the vertex without intravenous contrast. COMPARISON:  None. FINDINGS: Brain: No evidence of acute large vascular territory infarction, acute hemorrhage,  hydrocephalus, extra-axial collection or mass lesion/mass effect. Patchy white matter hypoattenuation, most likely related to moderate chronic microvascular ischemic disease. Ossification along the falx. Vascular: No hyperdense vessel identified. Calcific intracranial atherosclerosis. Skull: No acute fracture. Sinuses/Orbits: Visualized sinuses are clear. No acute orbital abnormality. Other: No mastoid effusions. ASPECTS [facility] Stroke Program Early CT Score) Total score (0-10 with 10 being normal): 10. IMPRESSION: 1. No evidence of acute large vascular territory infarct or acute hemorrhage. ASPECTS is 10. 2. Moderate chronic microvascular ischemic disease. Code stroke imaging results were communicated on 12/19/2020 at 12:28 pm to provider [place] via telephone, who verbally acknowledged these results. Electronically Signed   By: Margaretha Sheffield MD   On: 12/19/2020 12:35   CT ANGIO HEAD CODE STROKE  Result Date: 12/19/2020 CLINICAL DATA:  Code stroke. Right-sided facial droop, numbness and slurred speech. EXAM: CT ANGIOGRAPHY HEAD AND NECK TECHNIQUE: Multidetector CT imaging of the head and neck was performed using the standard protocol during bolus administration of intravenous contrast. Multiplanar CT image reconstructions and MIPs were obtained to evaluate the vascular anatomy. Carotid stenosis measurements (when applicable) are obtained utilizing NASCET criteria, using the distal internal carotid diameter as the denominator. CONTRAST:  32mL OMNIPAQUE IOHEXOL 350 MG/ML SOLN COMPARISON:  Same day code stroke CT head FINDINGS: CTA NECK FINDINGS Aortic arch: Great vessel origins are patent. Calcific and noncalcific atherosclerosis of the visualized aortic arch. Right carotid system: No evidence of dissection, stenosis (50% or greater) or occlusion.Mild calcific and noncalcific atherosclerosis at the carotid bifurcation. Left carotid system: No evidence  of dissection, stenosis (50% or greater) or occlusion.  Mild calcific and noncalcific atherosclerosis at the carotid bifurcation. 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 Skeleton: Moderate multilevel cervical degenerative disease with disc height loss, endplate sclerosis and endplate spurring. Other neck: No acute abnormality. Upper chest: Visualized lung apices are clear. Review of the MIP images confirms the above findings CTA HEAD FINDINGS Anterior circulation: No definite large vessel occlusion or proximal hemodynamically significant stenosis. Hypoplastic or absent right A1 ACA, likely congenital. The right A2 ACA appears to arise from the left A1 ACA. No aneurysm identified. Posterior circulation: Diminutive opacification of a small intradural left vertebral artery. Right intradural vertebral artery is patent. Basilar artery is small with bilateral fetal type PCAs, anatomic variant. Bilateral PCAs are patent proximally without proximal flow limiting stenosis. The distal PCAs are poorly characterized due to venous contamination. No aneurysm identified. Venous sinuses: As permitted by contrast timing, patent. Anatomic variants: See above. Review of the MIP images confirms the above findings IMPRESSION: CTA head: 1. No emergent large vessel occlusion or proximal hemodynamically significant stenosis. 2. Limited evaluation of the distal vasculature due to venous contamination. 3. Small vertebrobasilar system with bilateral fetal type PCAs, anatomic variant. CTA Neck: 1. 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 2. 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 3. Mild carotid bifurcation atherosclerosis without significant (greater than 50%) carotid stenosis in the neck. Findings discussed with Dr. Lorrin Goodell via telephone 1:29 p.m. Electronically Signed   By: Margaretha Sheffield MD   On: 12/19/2020 13:39   CT ANGIO NECK CODE STROKE  Result Date: 12/19/2020 CLINICAL DATA:  Code stroke. Right-sided facial droop, numbness and slurred speech. EXAM: CT ANGIOGRAPHY HEAD AND NECK TECHNIQUE: Multidetector CT imaging of the head and neck was performed using the standard protocol during bolus administration of intravenous contrast. Multiplanar CT image reconstructions and MIPs were obtained to evaluate the vascular anatomy. Carotid stenosis measurements (when applicable) are obtained utilizing NASCET criteria, using the distal internal carotid diameter as the denominator. CONTRAST:  57mL OMNIPAQUE IOHEXOL 350 MG/ML SOLN COMPARISON:  Same day code stroke CT head FINDINGS: CTA NECK FINDINGS Aortic arch: Great vessel origins are patent. Calcific and noncalcific atherosclerosis of the visualized aortic arch. Right carotid system: No evidence of dissection, stenosis (50% or greater) or occlusion.Mild calcific and noncalcific atherosclerosis at the carotid bifurcation. Left carotid system: No evidence of dissection, stenosis (50% or greater) or occlusion. Mild calcific and noncalcific atherosclerosis at the carotid bifurcation. 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 Skeleton: Moderate multilevel cervical degenerative disease with disc height loss, endplate sclerosis and endplate spurring. Other neck: No acute abnormality. Upper chest: Visualized lung apices are clear. Review of the MIP images confirms the above findings CTA HEAD FINDINGS  Anterior circulation: No definite large vessel occlusion or proximal hemodynamically significant stenosis. Hypoplastic or absent right A1 ACA, likely congenital. The right A2 ACA appears to arise from the left A1  ACA. No aneurysm identified. Posterior circulation: Diminutive opacification of a small intradural left vertebral artery. Right intradural vertebral artery is patent. Basilar artery is small with bilateral fetal type PCAs, anatomic variant. Bilateral PCAs are patent proximally without proximal flow limiting stenosis. The distal PCAs are poorly characterized due to venous contamination. No aneurysm identified. Venous sinuses: As permitted by contrast timing, patent. Anatomic variants: See above. Review of the MIP images confirms the above findings IMPRESSION: CTA head: 1. No emergent large vessel occlusion or proximal hemodynamically significant stenosis. 2. Limited evaluation of the distal vasculature due to venous contamination. 3. Small vertebrobasilar system with bilateral fetal type PCAs, anatomic variant. CTA Neck: 1. 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 2. 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 3. Mild carotid bifurcation atherosclerosis without significant (greater than 50%) carotid stenosis in the neck. Findings discussed with Dr. Lorrin Goodell via telephone 1:29 p.m. Electronically Signed   By: Margaretha Sheffield MD   On: 12/19/2020 13:39     PHYSICAL EXAM  Temp:  [98.2 F (36.8 C)-99.1 F (37.3 C)] 99.1 F (37.3 C) (06/26 0800) Pulse Rate:  [70-111] 102 (06/26 0900) Resp:  [0-34] 24 (06/26 0900) BP: (134-173)/(81-112) 154/90 (06/26 0900) SpO2:  [90 %-100 %] 92 % (06/26 0900) Weight:  [106.1 kg-106.4 kg] 106.1 kg (06/25 1337)  General - Well nourished, well developed, in no apparent  distress.  Ophthalmologic - fundi not visualized due to noncooperation.  Cardiovascular - Regular rhythm and rate.  Mental Status -  Level of arousal and orientation to time, place, and person were intact. Language including expression, naming, repetition, comprehension was assessed and found intact. Fund of Knowledge was assessed and was intact.  Cranial Nerves II - XII - II - Visual field intact OU. III, IV, VI - Extraocular movements intact. V - Facial sensation intact bilaterally. VII - mild right nasolabial fold flattening. VIII - Hearing & vestibular intact bilaterally. X - Palate elevates symmetrically. XI - Chin turning & shoulder shrug intact bilaterally. XII - Tongue protrusion intact.  Motor Strength - The patient's strength was normal in all extremities and pronator drift was absent.  Bulk was normal and fasciculations were absent.   Motor Tone - Muscle tone was assessed at the neck and appendages and was normal.  Reflexes - The patient's reflexes were symmetrical in all extremities and he had no pathological reflexes.  Sensory - Light touch, temperature/pinprick were assessed and were symmetrical.    Coordination - The patient had normal movements in the hands with no ataxia or dysmetria.  Tremor was absent.  Gait and Station - deferred.   ASSESSMENT/PLAN Edward Pittman is a 80 y.o. male with history of afib off Xarelto for a week, cardiomyopathy, HTN and HLD admitted for dizziness, right facial droop and right arm weakness. 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 secondary to afib out of AC CT head no acute finding CTA head and neck b/l VA origin stenosis MRI  pending 2D Echo EF 60-65% LDL 83 HgbA1c pending SCDs for VTE prophylaxis Xarelto (rivaroxaban) daily prior to admission but off for the last one week, now on No antithrombotic within 24h of tPA Patient counseled to be compliant with his antithrombotic medications Ongoing  aggressive stroke risk factor management Therapy recommendations:  pending  Disposition:  pending  Afib, chronic Home med including cardizem and metoprolol HR 90s-120s Will resume cardizem Will resume low dose of metoprolol BP monitoring Off Xarelto for a week, did not refill May resume Xarelto if MRI shows no large infarct  CHF/cardiomyopathy Home meds including metoprolol, lasix PRN, farxiga  EF 60-65% On low dose metoprolol to avoid low BP and bradycardia Resume farxiga Follows with Dr. Percival Spanish   Episode of bradycardia in the setting of nausea dry heaving Likely vasovagal effect On zofran Encourage gradually resume diet (pt not eating for 2 days) Tele monitoring  Hypertension Stable BP goal < 180/105 Long term BP goal normotensive   Hyperlipidemia Home meds:  zocor 40  LDL 83, goal < 70 Now on lipitor 40 Continue statin at discharge  Other Stroke Risk Factors Advanced age Former smoker Obesity, Body mass index is 30.87 kg/m.   Other Active Problems Leukocytosis WBC 10.9->13.8 Hypokalemia K 3.3->3.7  Hospital day # 1  This patient is critically ill due to stroke s/p tPA, afib with RVR, cardiomyopathy and at significant risk of neurological worsening, death form recurrent stroke, bleeding from tPA, heart failure, seizure. This patient's care requires constant monitoring of vital signs, hemodynamics, respiratory and cardiac monitoring, review of multiple databases, neurological assessment, discussion with family, other specialists and medical decision making of high complexity. I spent 40 minutes of neurocritical care time in the care of this patient. I had long discussion with wife and daughter and pt at bedside, updated pt current condition, treatment plan and potential prognosis, and answered all the questions. They expressed understanding and appreciation.    Rosalin Hawking, MD PhD Stroke Neurology 12/20/2020 10:26 AM    To contact Stroke Continuity  provider, please refer to [email or web address] After hours, contact General Neurology

## 2020-12-20 NOTE — Progress Notes (Signed)
Pt's HR ranging from 110 to 140s but not sustaining.  Dr. Erlinda Hong notified and ordered home dose of metoprolol.

## 2020-12-20 NOTE — Evaluation (Signed)
Occupational Therapy Evaluation Patient Details Name: Edward Pittman MRN: [id number] DOB: 12/19/1940 Today's Date: 12/20/2020    History of Present Illness This 80 y.o. male admitted 6/24 with new onset dizziness and Rt sided weakness and facial droop.  Pt received tPA.  CT of head negative for intracranial abnormality, CTA > no LVO.  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 CT of abdomen and pelvis showed infarcts of bil. superior and inferior renal poles. MRI of the brain showed  Small acute infarcts in bilateral cerebellar hemispheres and at the junction of the pons and right middle cerebellar peduncle. 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 He demonstrates the below listed deficits and will benefit from continued OT to maximize safety and independence with BADLs.  Pt presents to OT with decreased activity tolerance, impaired balance, impaired coordination bil. UEs. .  He currently requires set up assist - min A for UB ADLs and mod A for LB ADLs.  He requires mod A +2 for functional mobility demonstrating a posterior bias upon standing and a significant LOB when ambulating that required mod A to correct.  He lives with his wife, who can assist at discharge.  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  Recommend CIR level rehab.      Follow Up Recommendations  CIR    Equipment Recommendations  3 in 1 bedside commode    Recommendations for Other Services Rehab consult     Precautions / Restrictions Precautions Precautions: Fall Precaution Comments: very unsteady, easily dizzy Restrictions Weight Bearing Restrictions: No       Mobility Bed Mobility Overal bed mobility: Needs Assistance Bed Mobility: Supine to Sit;Sit to Supine     Supine to sit: Supervision Sit to supine: Supervision   General bed mobility comments: Bed flat with rails down to simulate home, pt able to perform all bed mobility safely with supervision.    Transfers Overall transfer level: Needs assistance Equipment used: 1 person hand held assist Transfers: Sit to/from Stand Sit to Stand: Mod assist;+2 physical assistance;+2 safety/equipment         General transfer comment: Initially did not provide pt a hand for transfer to assess his independence, but pt began to have significant posterior lean with bil forefeet lifting off ground, needing modAx2 and R HHA to gain balance to power up fully to stand.    Balance Overall balance assessment: Needs assistance Sitting-balance support: No upper extremity supported;Feet supported Sitting balance-Leahy Scale: Good Sitting balance - Comments: Able to reach down to ground to donn sock without LOB, supervision for safety. Postural control: Posterior lean Standing balance support: Bilateral upper extremity supported;During functional activity Standing balance-Leahy Scale: Poor Standing balance comment: Pt with posterior LOB bouts, needing bil HHA modAx2.                           ADL either performed or assessed with clinical judgement   ADL Overall ADL's : Needs assistance/impaired Eating/Feeding: Set up;Sitting   Grooming: Wash/dry hands;Wash/dry face;Oral care;Brushing hair;Set up;Supervision/safety;Sitting   Upper Body Bathing: Set up;Supervision/ safety;Sitting   Lower Body Bathing: Moderate assistance;Sit to/from stand  Upper Body Dressing : Set up;Supervision/safety;Sitting   Lower Body Dressing: Moderate assistance;Sit to/from stand Lower Body Dressing Details (indicate cue type and reason): mod A to don Rt sock.  He is able to don the Lt with close min  guard assist and requires mod A to maintain standing balance Toilet Transfer: Moderate assistance;+2 for physical assistance;+2 for safety/equipment;Ambulation;Comfort height toilet;Grab bars   Toileting- Clothing Manipulation and Hygiene: Moderate assistance;Sit to/from stand       Functional mobility during ADLs: Moderate assistance;+2 for physical assistance;+2 for safety/equipment General ADL Comments: Pt demonstrates impaired balance     Vision Baseline Vision/History: Glaucoma Patient Visual Report: No change from baseline Vision Assessment?: Yes Eye Alignment: Within Functional Limits Ocular Range of Motion: Within Functional Limits Alignment/Gaze Preference: Within Defined Limits Tracking/Visual Pursuits: Able to track stimulus in all quads without difficulty Visual Fields: Right visual field deficit Additional Comments: Pt demonstrates impaired Rt field of Rt eye - he reports this is baseline due to glaucoma.  Lt eye with questionable mildly impaired Rt peripheral field, but difficult to assess due to pt distractability this date     Perception Perception Perception Tested?: Yes   Praxis Praxis Praxis tested?: Within functional limits    Pertinent Vitals/Pain Pain Assessment: Faces Faces Pain Scale: No hurt Pain Intervention(s): Monitored during session     Hand Dominance Right   Extremity/Trunk Assessment Upper Extremity Assessment Upper Extremity Assessment: RUE deficits/detail;LUE deficits/detail RUE Deficits / Details: Rt UE tremulous with some dysmetria. RUE Coordination: decreased fine motor;decreased gross motor LUE Deficits / Details: Lt UE tremulous (he and wife report he had a tremor at baseline, but it is currently worse).  Mild dysmetria noted LUE Coordination: decreased gross motor;decreased fine motor   Lower Extremity Assessment Lower Extremity Assessment: RLE deficits/detail;LLE deficits/detail RLE Deficits / Details: MMT scores of 5 grossly;  denies numbness/tingling; dysdiadochokinesia noted (more on L than R) RLE Sensation: WNL RLE Coordination: decreased gross motor;decreased fine motor (dysdiadochokinesia noted) LLE Deficits / Details: MMT scores of 5 grossly; denies numbness/tingling; dysdiadochokinesia noted (more on L than R) LLE Sensation: WNL LLE Coordination: decreased fine motor;decreased gross motor (dysdiadochokinesia noted)   Cervical / Trunk Assessment Cervical / Trunk Assessment: Normal   Communication Communication Communication: No difficulties   Cognition Arousal/Alertness: Awake/alert Behavior During Therapy: WFL for tasks assessed/performed Overall Cognitive Status: Within Functional Limits for tasks assessed                                 General Comments: WFL for basic tasks, but will benefit from further assessment   General Comments  wife present.  Discussed recommendation for CIR.  Pt is somewhat resistant as he wants to go home, however, wife seemed receptive    Exercises     Shoulder Instructions      Home Living Family/patient expects to be discharged to:: Private residence Living Arrangements: Spouse/significant other Available Help at Discharge: Family;Available 24 hours/day Type of Home: House Home Access: Stairs to enter [employer] of Steps: 2-3 Entrance Stairs-Rails: Right;Left;Can reach both Home Layout: One level     Bathroom Shower/Tub: Tub/shower unit;Door   Bathroom Toilet: Handicapped height     Home Equipment: None          Prior Functioning/Environment Level of Independence: Independent        Comments: 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  OT Problem List: Decreased activity tolerance;Impaired balance (sitting and/or standing);Impaired vision/perception;Decreased coordination;Decreased safety awareness;Decreased knowledge of use of DME or AE;Impaired UE functional use       OT Treatment/Interventions: Self-care/ADL training;Neuromuscular education;DME and/or AE instruction;Therapeutic activities;Visual/perceptual remediation/compensation;Patient/family education;Balance training    OT Goals(Current goals can be found in the care plan section) Acute Rehab OT Goals Patient Stated Goal: to get back to normal and working on cars OT Goal Formulation: With patient/family Time For Goal Achievement: 01/03/21 Potential to Achieve Goals: Good ADL Goals Pt Will Perform Grooming: with min guard assist;standing Pt Will Perform Lower Body Bathing: with min guard assist;sit to/from stand Pt Will Perform Lower Body Dressing: with min guard assist;sit to/from stand Pt Will Transfer to Toilet: with min guard assist;ambulating;regular height toilet;bedside commode;grab bars Pt Will Perform Toileting - Clothing Manipulation and hygiene: with min guard assist;sit to/from stand Pt Will Perform Tub/Shower Transfer: Tub transfer;with min assist;3 in 1;shower seat;ambulating  OT Frequency: Min 2X/week   Barriers to D/C:            Co-evaluation PT/OT/SLP Co-Evaluation/Treatment: Yes Reason for Co-Treatment: For patient/therapist safety;To address functional/ADL transfers PT goals addressed during session: Mobility/safety with mobility;Balance OT goals addressed during session: ADL's and self-care      AM-PAC OT "6 Clicks" Daily Activity     Outcome Measure Help from another person eating meals?: A Little Help from another person taking care of personal grooming?: A Little Help from another person toileting, which includes using toliet, bedpan, or urinal?: A Lot Help from another person bathing (including washing, rinsing, drying)?: A Lot Help from another person to put on and taking off regular upper body clothing?: A Little Help from another person to put on and taking off regular lower body clothing?: A Lot 6 Click Score: 15   End of Session Equipment Utilized During  Treatment: Gait belt Nurse Communication: Mobility status  Activity Tolerance: Patient tolerated treatment well Patient left: in bed;with call bell/phone within reach;with bed alarm set;with family/visitor present  OT Visit Diagnosis: Unsteadiness on feet (R26.81);Ataxia, unspecified (R27.0)                Time: 8295-6213 OT Time Calculation (min): 20 min Charges:  OT General Charges $OT Visit: 1 Visit  Nilsa Nutting., OTR/L Acute Rehabilitation Services Pager [phone] Office [phone]   Lucille Passy M 12/20/2020, 1:32 PM

## 2020-12-20 NOTE — Plan of Care (Signed)
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 Recommend correlation with prior x-ray cross-sectional imaging of the pelvis.  I discussed with Radiologist on call that we do not have prior x-ray cross-sectional imaging of the pelvis in PACS system.  At this time, radiologist recommend either nuclear bone scan or pelvic MRI to further confirm Paget's disease.  This can be done as outpatient. Will defer to PCP follow up on this issue.   Rosalin Hawking, MD PhD Stroke Neurology 12/20/2020 6:36 PM

## 2020-12-21 LAB — CBC
HCT: 44.1 % (ref 39.0–52.0)
Hemoglobin: 15.1 g/dL (ref 13.0–17.0)
MCH: 33.2 pg (ref 26.0–34.0)
MCHC: 34.2 g/dL (ref 30.0–36.0)
MCV: 96.9 fL (ref 80.0–100.0)
Platelets: 184 10*3/uL (ref 150–400)
RBC: 4.55 MIL/uL (ref 4.22–5.81)
RDW: 12.8 % (ref 11.5–15.5)
WBC: 13.4 10*3/uL — ABNORMAL HIGH (ref 4.0–10.5)
nRBC: 0 % (ref 0.0–0.2)

## 2020-12-21 LAB — BASIC METABOLIC PANEL
Anion gap: 7 (ref 5–15)
BUN: 18 mg/dL (ref 8–23)
CO2: 28 mmol/L (ref 22–32)
Calcium: 9.3 mg/dL (ref 8.9–10.3)
Chloride: 100 mmol/L (ref 98–111)
Creatinine, Ser: 0.91 mg/dL (ref 0.61–1.24)
GFR, Estimated: >60 mL/min (ref >60)
Glucose, Bld: 137 mg/dL — ABNORMAL HIGH (ref 70–99)
Potassium: 4 mmol/L (ref 3.5–5.1)
Sodium: 135 mmol/L (ref 135–145)

## 2020-12-21 LAB — HEMOGLOBIN A1C
Hgb A1c MFr Bld: 6.9 % — ABNORMAL HIGH (ref 4.8–5.6)
Mean Plasma Glucose: 151 mg/dL

## 2020-12-21 MED ORDER — ATORVASTATIN CALCIUM 40 MG PO TABS: 40.0000 mg | ORAL_TABLET | Freq: Every day | ORAL | 3 refills | Status: DC

## 2020-12-21 NOTE — Progress Notes (Signed)
Inpatient Rehab Admissions Coordinator:   Consult received.  I met with patient and family at bedside who report that PT had just been in and pt did extremely well.  They prefer home with outpatient, which is what PT is now recommending.  CIR will sign off at this time.  Pt may benefit from ancillary referral to [facility] to f/u with one of our physicians.    Shann Medal, PT, DPT Admissions Coordinator [phone] 12/21/20  1:43 PM

## 2020-12-21 NOTE — Progress Notes (Addendum)
Physical Therapy Treatment Patient Details Name: Edward Pittman MRN: [id number] DOB: [birth year] Today's Date: 12/21/2020    History of Present Illness This 80 y.o. male admitted 6/24 with new onset dizziness and Rt sided weakness and facial droop.  Pt received tPA.  CT of head negative for intracranial abnormality, CTA > no LVO.  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 CT of abdomen and pelvis showed infarcts of bil. superior and inferior renal poles.  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 Pt ambulating x 250 feet with a walker at a min guard assist level. SpO2 94% on RA, BP 136/81. Pt continues with mild dynamic balance deficits; would benefit from OPPT to address and maximize functional independence.    Follow Up Recommendations  Supervision for mobility/OOB;Outpatient PT (neuro)     Equipment Recommendations  Rolling walker with 5" wheels;3in1 (PT)    Recommendations for Other Services      Precautions / Restrictions Precautions Precautions: Fall Restrictions Weight Bearing Restrictions: No    Mobility  Bed Mobility Overal bed mobility: Modified Independent                  Transfers Overall transfer level: Needs assistance Equipment used: Rolling walker (2 wheeled) Transfers: Sit to/from Stand Sit to Stand: Min guard            Ambulation/Gait Ambulation/Gait assistance: Min guard Gait Distance (Feet): 250 Feet Assistive device: Rolling walker (2 wheeled) Gait Pattern/deviations: Step-through pattern;Decreased stride length Gait velocity: reduced   General Gait Details: Mild dynamic  instability, but no overt LOB with bilateral handheld support on walker. Cues for walker proximity. Min guard for Barrister's clerk    Modified Rankin (Stroke Patients Only) Modified Rankin (Stroke Patients Only) Pre-Morbid Rankin Score: No symptoms Modified Rankin: Moderately severe disability     Balance Overall balance assessment: Needs assistance Sitting-balance support: No upper extremity supported;Feet supported Sitting balance-Leahy Scale: Good     Standing balance support: Bilateral upper extremity supported;During functional activity Standing balance-Leahy Scale: Poor Standing balance comment: reliant on RW                            Cognition Arousal/Alertness: Awake/alert Behavior During Therapy: WFL for tasks assessed/performed Overall Cognitive Status: Impaired/Different from baseline Area of Impairment: Safety/judgement                         Safety/Judgement: Decreased awareness of safety;Decreased awareness of deficits     General Comments: Pt stating "he was going to get up by himself, but was attached to the lines."      Exercises      General Comments        Pertinent Vitals/Pain Pain Assessment: Faces Faces Pain Scale: No hurt    Home Living     Available Help at Discharge: Family;Available 24 hours/day Type of Home: House              Prior Function            PT Goals (current  goals can now be found in the care plan section) Acute Rehab PT Goals Patient Stated Goal: to improve PT Goal Formulation: With patient/family Time For Goal Achievement: 01/03/21 Potential to Achieve Goals: Good Progress towards PT goals: Progressing toward goals    Frequency    Min 4X/week      PT Plan Discharge plan needs to be updated    Co-evaluation              AM-PAC PT "6 Clicks" Mobility   Outcome Measure  Help needed turning from your back to your side while in a  flat bed without using bedrails?: None Help needed moving from lying on your back to sitting on the side of a flat bed without using bedrails?: None Help needed moving to and from a bed to a chair (including a wheelchair)?: A Little Help needed standing up from a chair using your arms (e.g., wheelchair or bedside chair)?: A Little Help needed to walk in hospital room?: A Little Help needed climbing 3-5 steps with a railing? : A Little 6 Click Score: 20    End of Session Equipment Utilized During Treatment: Gait belt Activity Tolerance: Patient tolerated treatment well Patient left: with call bell/phone within reach;in chair;with chair alarm set;with family/visitor present Nurse Communication: Mobility status PT Visit Diagnosis: Unsteadiness on feet (R26.81);Other abnormalities of gait and mobility (R26.89);Difficulty in walking, not elsewhere classified (R26.2);Other symptoms and signs involving the nervous system (R29.898);Dizziness and giddiness (R42)     Time: 0177-9390 PT Time Calculation (min) (ACUTE ONLY): 21 min  Charges:  $Gait Training: 8-22 mins                     Wyona Almas, PT, DPT Acute Rehabilitation Services Pager [phone] Office [phone]    Deno Etienne 12/21/2020, 11:50 AM

## 2020-12-21 NOTE — Discharge Instructions (Addendum)
[name],  You have been hospitalized due to a stroke that occurred to the left side of your brain and affected the right side of your body.   We think your strokes are due to your atrial fibrillation due to not taking your Xarelto.  Please take your Xarelto to prevent future strokes.   Please go to this link below or call the number below to find out more about financial assistance for [facility]   [email or web address]  [employer]: [email or web address] [phone]).  You were taking Simvastatin prior to this hospitalization. You have been switched to a stronger cholesterol medication called Atorvastatin. Please pick this up from your pharmacy and take it. This also helps to prevent stroke.   You are to have outpatient Physical Therapy/Occupational Therapy.   Follow up with your PCP- you are to make this appointment.   Expect a call from [place] regarding your neurology follow up appointment in the stroke clinic.   It was a pleasure caring for you. We wish you the best.   Sincerely,  [facility]

## 2020-12-21 NOTE — Progress Notes (Deleted)
Stroke Discharge Summary  Patient ID: Edward Pittman       MRN: [id number]      DOB: [birth year]  Date of Admission: 12/19/2020 Date of Discharge: 12/21/2020  Attending Physician:  Dr. Erlinda Hong  Consultant(s):   None  Patient's PCP:  [name], MD  DISCHARGE DIAGNOSIS:  Principal Problem:   Embolic stroke [facility])   Allergies as of 12/21/2020       Reactions   Benazepril Cough   Metformin And Related Nausea Only        Medication List     STOP taking these medications    simvastatin 40 MG tablet Commonly known as: ZOCOR       TAKE these medications    atorvastatin 40 MG tablet Commonly known as: LIPITOR Take 1 tablet (40 mg total) by mouth daily. Start taking on: December 22, 2020   B Complete Tabs Take 1 tablet by mouth daily with breakfast.   dapagliflozin propanediol 10 MG Tabs tablet Commonly known as: Farxiga Take 1 tablet (10 mg total) by mouth daily before breakfast.   diltiazem 240 MG 24 hr capsule Commonly known as: CARDIZEM CD Take 1 capsule (240 mg total) by mouth daily.   furosemide 40 MG tablet Commonly known as: LASIX Take 40 mg by mouth daily as needed for fluid.   Garlique 400 MG Tbec Generic drug: Garlic Take 951 mg by mouth daily.   latanoprost 0.005 % ophthalmic solution Commonly known as: XALATAN Place 1 drop into both eyes at bedtime.   NON FORMULARY Take 1-2 tablets by mouth See admin instructions. HumanN SuperBeets Heart Chews- Chew 1-2 squares daily   OMEGA-3 PO Take 1 capsule by mouth daily with breakfast.   VITAMIN A PO Take 1 capsule by mouth daily with breakfast.       ASK your doctor about these medications    metoprolol tartrate 100 MG tablet Commonly known as: LOPRESSOR Take 1 tablet (100 mg total) by mouth 2 (two) times daily. NEED OV.   Xarelto 20 MG Tabs tablet Generic drug: rivaroxaban TAKE 1 TABLET(20 MG) BY MOUTH DAILY WITH SUPPER        Pertinent Imaging  12/19/20 CT Head  1. No evidence of acute  large vascular territory infarct or acute hemorrhage. ASPECTS is 10. 2. Moderate chronic microvascular ischemic disease.  12/19/20 CTA Head and Neck  CTA Head: 1. No emergent large vessel occlusion or proximal hemodynamically significant stenosis. 2. Limited evaluation of the distal vasculature due to venous contamination. 3. Small vertebrobasilar system with bilateral fetal type PCAs anatomic variant.   CTA Neck: 1. 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 2. 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 3. Mild carotid bifurcation atherosclerosis without significant (greater than 50%) carotid stenosis in the neck.  12/20/20 MRI Brain WO Contrast  1. 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 Slight edema in the left cerebellum without mass effect. 2. Moderate chronic microvascular ischemic disease and mild atrophy.  12/19/20 Echo Complete  1. Left ventricular ejection fraction, by estimation, is 60 to 65%. The left ventricle has normal function. The left ventricle has no regional wall motion abnormalities. There is mild concentric left ventricular hypertrophy. Left ventricular diastolic function could not be evaluated.   2.  Right ventricular systolic function is mildly reduced. The right ventricular size is mildly enlarged.   3. Left atrial size was mildly dilated.   4. Right atrial size was mildly dilated.   5. The mitral valve is grossly normal. Mild mitral valve regurgitation. No evidence of mitral stenosis.   6. The aortic valve is tricuspid. There is mild calcification of the aortic valve. Aortic valve regurgitation is not visualized. Mild aortic valve sclerosis is present, with no evidence of aortic valve  stenosis.    History of Present Edward Pittman is a 80 y.o. male with PMH significant for Afibb who ran out of Xarelto a week ago, hx of cardiomyopathy, HLD, HTN who woke up with some dizziness in the morning which resolved. Around 1100, he had sudden onset R sided weakness and R facial droop for which EMS was called. Noted to have R neglect and brought in as a stroke code.   CT Head without contrast with no ICH.   mRS: 0 tPA: offered and patient opted for tPA at 1228. I revisited the discussion with wife later and she agreed to keep it going. Thrombectomy: No LVO, may be ?L PCA P2 stenosis with flow distally.   Physical Examination  General - Well nourished, well developed, in no apparent distress. Ophthalmologic - fundi not visualized due to noncooperation. Cardiovascular - Regular rhythm and rate.  Mental Status -  Level of arousal and orientation to time, place, and person were intact. Language including expression, naming, repetition, comprehension was assessed and found intact. Fund of Knowledge was assessed and was intact.  Cranial Nerves II - XII - II - Visual field intact OU. III, IV, VI - Extraocular movements intact. V - Facial sensation intact bilaterally. VII - mild right nasolabial fold flattening. VIII - Hearing & vestibular intact bilaterally. X - Palate elevates symmetrically. XI - Chin turning & shoulder shrug intact bilaterally. XII - Tongue protrusion intact.  Motor Strength - The patient's strength was normal in all extremities and pronator drift was absent.  Bulk was normal and fasciculations were absent.   Motor Tone - Muscle tone was assessed at the neck and appendages and was normal.  Reflexes - The patient's reflexes were symmetrical in all extremities and he had no pathological reflexes.  Sensory - Light touch, temperature/pinprick were assessed and were symmetrical.    Coordination - The patient had normal movements in the hands with no  ataxia or dysmetria.  Tremor was absent.  Gait and Station - deferred.  Assessment and Plan   Edward Pittman is a 80 y.o. male with history of afib off Xarelto for a week, cardiomyopathy, HTN and HLD admitted for dizziness, right facial droop and right arm weakness. Received IVTPA. Thrombectomy not done due to lack of LVO.   Stroke:  Left brain infarct s/p tPA likely embolic secondary to afib out of AC CT head no acute finding CTA head and neck b/l VA origin stenosis MRI showed small acute stroke in bilateral cerebellar hemispheres, junction of the pons and right middle cerebellar peduncle  2D Echo EF 60-65% LDL 83 HgbA1c 6.9 SCDs for VTE prophylaxis Xarelto (rivaroxaban) daily prior to admission but off for the last one week, restarted on 6/26 and discharged on Xarelto. Provided with patient assistance information given difficulty obtaining Xarelto due to cost  Patient counseled to be compliant with his antithrombotic medications Ongoing aggressive stroke risk factor management Therapy recommendations: Home health PT  Disposition:  Discharged to home  with home health PT and OT  Will follow up in stroke clinic, referral placed at discharge   Afib, chronic Home med including cardizem and metoprolol resumed this admission  Xarelto re initiated on 12/20/20   CHF/cardiomyopathy Home meds including metoprolol, lasix PRN, farxiga- will continue at discharge  EF 60-65% Follows with Dr. Percival Spanish   Stroke Dysphagia Screening Passed for regular diet, heart healthy diet recommend at discharge   Hyperlipidemia Home meds:  Simvastatin 40 mg QD transitioned to Atorvastatin 40 mg this admission, will continue at discharge  LDL 83, goal < 70  Other Stroke Risk Factors Advanced age Former smoker Obesity, Body mass index is 30.87 kg/m.    Ruta Hinds, NP  Stroke Service Nurse Practitioner  Patient seen and discussed with attending physician Dr. Erlinda Hong   30 minutes were spent  preparing this discharge   Hospital day # 2  This patient is critically ill due to stroke s/p tPA, afib with RVR, cardiomyopathy and at significant risk of neurological worsening, death form recurrent stroke, bleeding from tPA, heart failure, seizure. This patient's care requires constant monitoring of vital signs, hemodynamics, respiratory and cardiac monitoring, review of multiple databases, neurological assessment, discussion with family, other specialists and medical decision making of high complexity. I spent 40 minutes of neurocritical care time in the care of this patient. I had long discussion with wife and daughter and pt at bedside, updated pt current condition, treatment plan and potential prognosis, and answered all the questions. They expressed understanding and appreciation.    Rosalin Hawking, MD PhD Stroke Neurology 12/21/2020 3:10 PM    To contact Stroke Continuity provider, please refer to [email or web address] After hours, contact General Neurology

## 2020-12-21 NOTE — TOC Transition Note (Addendum)
Transition of Care [facility]) - CM/SW Discharge Note   Patient Details  Name: Edward Pittman MRN: [id number] Date of Birth: 1940/09/21  Transition of Care [facility]) CM/SW Contact:  Ella Bodo, RN Phone Number: 12/21/2020, 4:31 PM   Clinical Narrative:   Pt admitted on 12/19/20 after Lt brain infract with TPA given.  PTA, pt independent, lives at home with spouse.  PT/OT orginally recommending CIR, but patient has progressed to OP therapy.  Referrals made to [facility] for follow up.  Benefits check done for Xarelto, as pt states he is paying $300/month for med.  Benefits check shows that copay is $138.33.  Patient given [employer] patient assistance information; pt will need to call [employer] to request assistance with copay.   1749 Referral to [facility] for recommended DME. Plan delivery to bedside in AM.       Final next level of care: OP Rehab Barriers to Discharge: Barriers Resolved   Patient Goals and CMS Choice Patient states their goals for this hospitalization and ongoing recovery are:: to go home                          Discharge Plan and Services   Discharge Planning Services: CM Consult, Medication Assistance                                 Social Determinants of Health (SDOH) Interventions     Readmission Risk Interventions Readmission Risk Prevention Plan 12/21/2020  Post Dischage Appt Complete  Medication Screening Complete  Transportation Screening Complete  Some recent data might be hidden   Reinaldo Raddle, RN, BSN  Trauma/Neuro ICU Case Manager [phone]

## 2020-12-21 NOTE — Evaluation (Signed)
Speech Language Pathology Evaluation Patient Details Name: COUGAR IMEL MRN: [id number] DOB: 10/08/1940 Today's Date: 12/21/2020 Time: 9211-9417 SLP Time Calculation (min) (ACUTE ONLY): 13 min  Problem List:  Patient Active Problem List   Diagnosis Date Noted   Stroke determined by clinical assessment ([place]) 12/19/2020   Pain due to onychomycosis of toenails of both feet 11/30/2020   Chronic atrial fibrillation ([place]) 06/01/2019   Stable angina pectoris ([place]) 05/28/2019   Type 2 diabetes mellitus with complication, without long-term current use of insulin ([place]) 11/16/2017   BPH (benign prostatic hyperplasia) 12/12/2013   Routine general medical examination at a health care facility 12/12/2013   Cardiomyopathy, nonischemic ([place]) 08/09/2011   Hyperlipidemia with target LDL less than 100 07/14/2007   MITRAL REGURGITATION, MILD 07/14/2007   Essential hypertension 07/14/2007   Persistent atrial fibrillation ([place]) 07/14/2007   DEGENERATIVE JOINT DISEASE, HIPS 07/14/2007   Past Medical History:  Past Medical History:  Diagnosis Date   Chronic systolic dysfunction of left ventricle    Dyslipidemia    HTN (hypertension)    Hx of adenomatous colonic polyps    Hx of colonoscopy    Keloid    Mild mitral regurgitation by prior echocardiogram    Nonischemic cardiomyopathy (HCC)    Persistent atrial fibrillation (HCC)    s/p ablation 4/16  Dr. Rayann Heman   Past Surgical History:  Past Surgical History:  Procedure Laterality Date   ATRIAL FIBRILLATION ABLATION N/A 10/11/2011   Procedure: ATRIAL FIBRILLATION ABLATION;  Surgeon: Thompson Grayer, MD;  Location: [facility] CATH LAB;  Service: Cardiovascular;  Laterality: N/A;   COLONOSCOPY     FEMORAL HERNIA REPAIR     at age 42   HERNIA REPAIR     TEE WITHOUT CARDIOVERSION  10/10/2011   Procedure: TRANSESOPHAGEAL ECHOCARDIOGRAM (TEE);  Surgeon: Fay Records, MD;  Location: [facility] ENDOSCOPY;  Service: Cardiovascular;  Laterality: N/A;   HPI:  This 80 y.o.  male admitted 6/24 with new onset dizziness and Rt sided weakness and facial droop.  Pt received tPA.  CT of head negative for intracranial abnormality, CTA > no LVO.  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  Slight edema in the left cerebellum without mass effect. CT of abdomen and pelvis showed infarcts of bil. superior and inferior renal poles. PMH Includes: Chronic systolic dysfunction of Lt ventricle, HTN, keloid, mild mitral regurgitation, nonischemic cardiomyopathy, A-FIb > s/p ablation,   Assessment / Plan / Recommendation Clinical Impression  Pt presents with normal speech, fluency, and cognitive-communication with normal attention, orientation, short-term recall and problem solving. No dysarthria.  No SLP f/u is needed.    SLP Assessment  SLP Recommendation/Assessment: Patient does not need any further Speech Lanaguage Pathology Services SLP Visit Diagnosis: Cognitive communication deficit (R41.841)    Follow Up Recommendations  None    Frequency and Duration   N/a        SLP Evaluation Cognition  Overall Cognitive Status: Within Functional Limits for tasks assessed Arousal/Alertness: Awake/alert Orientation Level: Oriented X4 Attention: Selective Selective Attention: Appears intact Memory: Appears intact Awareness: Appears intact Problem Solving: Appears intact Executive Function: Organizing Organizing: Appears intact       Comprehension  Auditory Comprehension Overall Auditory Comprehension: Appears within functional limits for tasks assessed Visual Recognition/Discrimination Discrimination: Within Function Limits Reading Comprehension Reading Status: Within funtional limits    Expression Expression Primary Mode of Expression: Verbal Written Expression Dominant Hand: Right  Oral / Motor  Oral Motor/Sensory Function Overall Oral Motor/Sensory Function: Within  functional limits Motor Speech Overall Motor Speech: Appears within functional limits for tasks assessed   GO                    Juan Quam Laurice 12/21/2020, 11:41 AM  Estill Bamberg L. Tivis Ringer, [place] Office number [phone] Pager [phone]

## 2020-12-21 NOTE — TOC Benefit Eligibility Note (Signed)
Transition of Care [facility]) Benefit Eligibility Note    Patient Details  Name: Edward Pittman MRN: [id number] Date of Birth: 01-16-1941   Medication/Dose: [place] Reasons  20 MG DAILY  Covered?: Yes  Tier: 3 Drug  Prescription Coverage Preferred Pharmacy: [facility] with Person/Company/Phone Number:: [facility]  @  [place] [employer] #  [phone]  Co-Pay: $585.92  Prior Approval: No  Deductible: Met (OUT-OF-POCKET:UNMET)       Memory Argue Phone Number: 12/21/2020, 4:16 PM

## 2020-12-21 NOTE — Discharge Summary (Deleted)
Stroke Discharge Summary  Patient ID: Edward Pittman       MRN: [id number]      DOB: 1940/08/20  Date of Admission: 12/19/2020 Date of Discharge: 12/21/2020  Attending Physician:  Dr. Erlinda Hong  Consultant(s):   None  Patient's PCP:  [name], MD  DISCHARGE DIAGNOSIS:  Principal Problem:   Embolic stroke [facility])   Allergies as of 12/21/2020       Reactions   Benazepril Cough   Metformin And Related Nausea Only        Medication List     STOP taking these medications    simvastatin 40 MG tablet Commonly known as: ZOCOR       TAKE these medications    atorvastatin 40 MG tablet Commonly known as: LIPITOR Take 1 tablet (40 mg total) by mouth daily. Start taking on: December 22, 2020   B Complete Tabs Take 1 tablet by mouth daily with breakfast.   dapagliflozin propanediol 10 MG Tabs tablet Commonly known as: Farxiga Take 1 tablet (10 mg total) by mouth daily before breakfast.   diltiazem 240 MG 24 hr capsule Commonly known as: CARDIZEM CD Take 1 capsule (240 mg total) by mouth daily.   furosemide 40 MG tablet Commonly known as: LASIX Take 40 mg by mouth daily as needed for fluid.   Garlique 400 MG Tbec Generic drug: Garlic Take 716 mg by mouth daily.   latanoprost 0.005 % ophthalmic solution Commonly known as: XALATAN Place 1 drop into both eyes at bedtime.   NON FORMULARY Take 1-2 tablets by mouth See admin instructions. HumanN SuperBeets Heart Chews- Chew 1-2 squares daily   OMEGA-3 PO Take 1 capsule by mouth daily with breakfast.   VITAMIN A PO Take 1 capsule by mouth daily with breakfast.       ASK your doctor about these medications    metoprolol tartrate 100 MG tablet Commonly known as: LOPRESSOR Take 1 tablet (100 mg total) by mouth 2 (two) times daily. NEED OV.   Xarelto 20 MG Tabs tablet Generic drug: rivaroxaban TAKE 1 TABLET(20 MG) BY MOUTH DAILY WITH SUPPER        Pertinent Imaging  12/19/20 CT Head  1. No evidence of acute  large vascular territory infarct or acute hemorrhage. ASPECTS is 10. 2. Moderate chronic microvascular ischemic disease.  12/19/20 CTA Head and Neck  CTA Head: 1. No emergent large vessel occlusion or proximal hemodynamically significant stenosis. 2. Limited evaluation of the distal vasculature due to venous contamination. 3. Small vertebrobasilar system with bilateral fetal type PCAs anatomic variant.   CTA Neck: 1. 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 2. 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 3. Mild carotid bifurcation atherosclerosis without significant (greater than 50%) carotid stenosis in the neck.  12/20/20 MRI Brain WO Contrast  1. 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 Slight edema in the left cerebellum without mass effect. 2. Moderate chronic microvascular ischemic disease and mild atrophy.  12/19/20 Echo Complete  1. Left ventricular ejection fraction, by estimation, is 60 to 65%. The left ventricle has normal function. The left ventricle has no regional wall motion abnormalities. There is mild concentric left ventricular hypertrophy. Left ventricular diastolic function could not be evaluated.   2.  Right ventricular systolic function is mildly reduced. The right ventricular size is mildly enlarged.   3. Left atrial size was mildly dilated.   4. Right atrial size was mildly dilated.   5. The mitral valve is grossly normal. Mild mitral valve regurgitation. No evidence of mitral stenosis.   6. The aortic valve is tricuspid. There is mild calcification of the aortic valve. Aortic valve regurgitation is not visualized. Mild aortic valve sclerosis is present, with no evidence of aortic valve  stenosis.    History of Present Edward Pittman is a 80 y.o. male with PMH significant for Afibb who ran out of Xarelto a week ago, hx of cardiomyopathy, HLD, HTN who woke up with some dizziness in the morning which resolved. Around 1100, he had sudden onset R sided weakness and R facial droop for which EMS was called. Noted to have R neglect and brought in as a stroke code.   CT Head without contrast with no ICH.   mRS: 0 tPA: offered and patient opted for tPA at 1228. I revisited the discussion with wife later and she agreed to keep it going. Thrombectomy: No LVO, may be ?L PCA P2 stenosis with flow distally.   Physical Examination  General - Well nourished, well developed, in no apparent distress. Ophthalmologic - fundi not visualized due to noncooperation. Cardiovascular - Regular rhythm and rate.  Mental Status -  Level of arousal and orientation to time, place, and person were intact. Language including expression, naming, repetition, comprehension was assessed and found intact. Fund of Knowledge was assessed and was intact.  Cranial Nerves II - XII - II - Visual field intact OU. III, IV, VI - Extraocular movements intact. V - Facial sensation intact bilaterally. VII - mild right nasolabial fold flattening. VIII - Hearing & vestibular intact bilaterally. X - Palate elevates symmetrically. XI - Chin turning & shoulder shrug intact bilaterally. XII - Tongue protrusion intact.  Motor Strength - The patient's strength was normal in all extremities and pronator drift was absent.  Bulk was normal and fasciculations were absent.   Motor Tone - Muscle tone was assessed at the neck and appendages and was normal.  Reflexes - The patient's reflexes were symmetrical in all extremities and he had no pathological reflexes.  Sensory - Light touch, temperature/pinprick were assessed and were symmetrical.    Coordination - The patient had normal movements in the hands with no  ataxia or dysmetria.  Tremor was absent.  Gait and Station - deferred.  Assessment and Plan   Edward Pittman is a 80 y.o. male with history of afib off Xarelto for a week, cardiomyopathy, HTN and HLD admitted for dizziness, right facial droop and right arm weakness. Received IVTPA. Thrombectomy not done due to lack of LVO.   Stroke:  Left brain infarct s/p tPA likely embolic secondary to afib out of AC CT head no acute finding CTA head and neck b/l VA origin stenosis MRI showed small acute stroke in bilateral cerebellar hemispheres, junction of the pons and right middle cerebellar peduncle  2D Echo EF 60-65% LDL 83 HgbA1c 6.9 SCDs for VTE prophylaxis Xarelto (rivaroxaban) daily prior to admission but off for the last one week, restarted on 6/26 and discharged on Xarelto. Provided with patient assistance information given difficulty obtaining Xarelto due to cost  Patient counseled to be compliant with his antithrombotic medications Ongoing aggressive stroke risk factor management Therapy recommendations: Home health PT  Disposition:  Discharged to home  with home health PT and OT  Will follow up in stroke clinic, referral placed at discharge   Afib, chronic Home med including cardizem and metoprolol resumed this admission  Resumed home meds Rate controlled now Xarelto re initiated on 12/20/20  Follow-up with Dr. Percival Spanish cardiology on 12/24/2020 as scheduled.  CHF/cardiomyopathy Home meds including metoprolol, lasix PRN, farxiga- will continue at discharge  EF 60-65% Follows with Dr. Percival Spanish on 12/24/2020 and scheduled.  Hyperlipidemia Home meds:  Simvastatin 40 mg QD transitioned to Atorvastatin 40 mg this admission, will continue at discharge  LDL 83, goal < 70 Continue statin at discharge  Other Stroke Risk Factors Advanced age Former smoker Obesity, Body mass index is 30.87 kg/m.    Ruta Hinds, NP  Stroke Service Nurse Practitioner  Patient seen and  discussed with attending physician Dr. Erlinda Hong   35 minutes were spent preparing this discharge   Hospital day # 2  ATTENDING NOTE: I reviewed above note and agree with the assessment and plan. Pt was seen and examined.   Wife at the bedside.  Patient doing well overnight, no acute event, heart rate much improved after resume home dose of metoprolol.  Appetite improved, eating better, no more nausea vomiting episode.  Mild neuro deficit unchanged.  PT/OT recommend outpatient PT/OT at this time.  Xarelto restarted, tolerating well.  Will discharge home today.  He has appointment with Dr. Percival Spanish cardiology on 12/24/2020.  He will also follow-up with stroke clinic in [place] in 4 weeks.  For detailed assessment and plan, please refer to above as I have made changes wherever appropriate.   Rosalin Hawking, MD PhD Stroke Neurology 12/21/2020 4:04 PM    To contact Stroke Continuity provider, please refer to [email or web address] After hours, contact General Neurology

## 2020-12-22 DIAGNOSIS — I63443 Cerebral infarction due to embolism of bilateral cerebellar arteries: Principal | ICD-10-CM

## 2020-12-22 LAB — CBC
HCT: 43.2 % (ref 39.0–52.0)
Hemoglobin: 14.9 g/dL (ref 13.0–17.0)
MCH: 33.6 pg (ref 26.0–34.0)
MCHC: 34.5 g/dL (ref 30.0–36.0)
MCV: 97.3 fL (ref 80.0–100.0)
Platelets: 179 10*3/uL (ref 150–400)
RBC: 4.44 MIL/uL (ref 4.22–5.81)
RDW: 12.6 % (ref 11.5–15.5)
WBC: 11.2 10*3/uL — ABNORMAL HIGH (ref 4.0–10.5)
nRBC: 0 % (ref 0.0–0.2)

## 2020-12-22 LAB — BASIC METABOLIC PANEL
Anion gap: 8 (ref 5–15)
BUN: 20 mg/dL (ref 8–23)
CO2: 27 mmol/L (ref 22–32)
Calcium: 9.3 mg/dL (ref 8.9–10.3)
Chloride: 100 mmol/L (ref 98–111)
Creatinine, Ser: 0.95 mg/dL (ref 0.61–1.24)
GFR, Estimated: >60 mL/min (ref >60)
Glucose, Bld: 119 mg/dL — ABNORMAL HIGH (ref 70–99)
Potassium: 3.9 mmol/L (ref 3.5–5.1)
Sodium: 135 mmol/L (ref 135–145)

## 2020-12-22 NOTE — Progress Notes (Signed)
Cardiology Office Note   Date:  12/24/2020   ID:  Edward Pittman, DOB 17-May-1941, MRN [id number]  PCP:  [name], MD  Cardiologist:   Minus Breeding, MD    Chief Complaint  Patient presents with   Atrial Fibrillation       History of Present Illness: Edward Pittman is a 80 y.o. male who presents for follow up of atrial fib.   He had atrial fib and had ablation but had recurrent fib.  Since I last saw him he was in the hospital couple of days ago with a stroke.  He had been out of his Xarelto for about 5 or 6 days.  I reviewed the hospital records for this visit.  MRI showed small acute stroke in the bilateral cerebellar hemispheres.  He seems to have had recovery from this except he still having some balance issues and now walking with a walker.  He is about to start physical therapy.  Echocardiography was unremarkable.  Carotid Doppler showed no significant abnormalities.  He had some mild plaque.  He is back on his Xarelto.  The patient denies any new symptoms such as chest discomfort, neck or arm discomfort. There has been no new shortness of breath, PND or orthopnea. There have been no reported palpitations, presyncope or syncope.   Past Medical History:  Diagnosis Date   Chronic systolic dysfunction of left ventricle    Dyslipidemia    HTN (hypertension)    Hx of adenomatous colonic polyps    Hx of colonoscopy    Keloid    Mild mitral regurgitation by prior echocardiogram    Nonischemic cardiomyopathy ([place])    Persistent atrial fibrillation [facility])    s/p ablation 4/16  Dr. Rayann Heman    Past Surgical History:  Procedure Laterality Date   ATRIAL FIBRILLATION ABLATION N/A 10/11/2011   Procedure: ATRIAL FIBRILLATION ABLATION;  Surgeon: Thompson Grayer, MD;  Location: [facility] CATH LAB;  Service: Cardiovascular;  Laterality: N/A;   COLONOSCOPY     FEMORAL HERNIA REPAIR     at age 55   HERNIA REPAIR     TEE WITHOUT CARDIOVERSION  10/10/2011   Procedure: TRANSESOPHAGEAL  ECHOCARDIOGRAM (TEE);  Surgeon: Fay Records, MD;  Location[facility] ENDOSCOPY;  Service: Cardiovascular;  Laterality: N/A;     Current Outpatient Medications  Medication Sig Dispense Refill   atorvastatin (LIPITOR) 40 MG tablet Take 1 tablet (40 mg total) by mouth daily. 30 tablet 3   B Complex-Biotin-FA (B COMPLETE) TABS Take 1 tablet by mouth daily with breakfast.     dapagliflozin propanediol (FARXIGA) 10 MG TABS tablet Take 1 tablet (10 mg total) by mouth daily before breakfast. 90 tablet 1   diltiazem (CARDIZEM CD) 240 MG 24 hr capsule Take 1 capsule (240 mg total) by mouth daily. 90 capsule 1   metoprolol tartrate (LOPRESSOR) 100 MG tablet Take 1 tablet (100 mg total) by mouth 2 (two) times daily. NEED OV. (Patient taking differently: Take 100 mg by mouth 2 (two) times daily.) 60 tablet 1   rivaroxaban (XARELTO) 20 MG TABS tablet TAKE 1 TABLET(20 MG) BY MOUTH DAILY WITH SUPPER (Patient taking differently: Take 20 mg by mouth daily with supper.) 90 tablet 0   No current facility-administered medications for this visit.    Allergies:   Benazepril and Metformin and related     ROS:  Please see the history of present illness.   Otherwise, review of systems are positive for none.  All other systems are reviewed and negative.    PHYSICAL EXAM: VS:  BP 138/80   Pulse 84   Ht 6\' 1"  (1.854 m)   Wt 220 lb 9.6 oz (100.1 kg)   BMI 29.10 kg/m  , BMI Body mass index is 29.1 kg/m. GENERAL:  Well appearing NECK:  No jugular venous distention, waveform within normal limits, carotid upstroke brisk and symmetric, no bruits, no thyromegaly LUNGS:  Clear to auscultation bilaterally CHEST:  Unremarkable HEART:  PMI not displaced or sustained,S1 and S2 within normal limits, no S3, no clicks, no rubs, no murmurs, irregular ABD:  Flat, positive bowel sounds normal in frequency in pitch, no bruits, no rebound, no guarding, no midline pulsatile mass, no hepatomegaly, no splenomegaly EXT:  2 plus pulses  throughout, no edema, no cyanosis no clubbing  EKG:  EKG is not ordered today.    Recent Labs: 11/30/2020: TSH 2.13 12/19/2020: ALT 42 12/22/2020: BUN 20; Creatinine, Ser 0.95; Hemoglobin 14.9; Platelets 179; Potassium 3.9; Sodium 135    Lipid Panel    Component Value Date/Time   CHOL 125 12/20/2020 0235   TRIG 40 12/20/2020 0235   HDL 34 (L) 12/20/2020 0235   CHOLHDL 3.7 12/20/2020 0235   VLDL 8 12/20/2020 0235   LDLCALC 83 12/20/2020 0235      Wt Readings from Last 3 Encounters:  12/24/20 220 lb 9.6 oz (100.1 kg)  12/19/20 234 lb (106.1 kg)  11/30/20 237 lb (107.5 kg)      Other studies Reviewed: Additional studies/ records that were reviewed today include: Hospital records Review of the above records demonstrates: See elsewhere   ASSESSMENT AND PLAN:   ATRIAL FIB:    [name] has a CHA2DS2 - VASc score of 2.  He is back on his anticoagulation.  He had good rate control.  We talked about getting a pulse oximeter so he can watch his rate control at home.  No change in therapy.   HTN:   The blood pressure is at target.  No change in therapy.  DYSLIPIDEMIA:   LDL was 83 with an HDL of 34.  He will continue the meds as listed.   He was not quite at target and he is statin was changed to Crestor.  I will put him in for a lipid profile in 2 or 3 months.  CVA: Records reviewed as above.  He is going to continue his physical therapy.  No further work-up is planned.  Current medicines are reviewed at length with the patient today.  The patient does not have concerns regarding medicines.  The following changes have been made:  None  Labs/ tests ordered today include:  None  Orders Placed This Encounter  Procedures   Lipid panel      Disposition:   FU with me in me in 6 months.    Signed, Minus Breeding, MD  12/24/2020 9:34 AM    [facility]

## 2020-12-22 NOTE — Progress Notes (Signed)
Occupational Therapy Treatment Patient Details Name: Edward Pittman MRN: [id number] DOB: 1941/01/25 Today's Date: 12/22/2020    History of present illness This 80 y.o. male admitted 6/24 with new onset dizziness and Rt sided weakness and facial droop.  Pt received tPA.  CT of head negative for intracranial abnormality, CTA > no LVO.  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 CT of abdomen and pelvis showed infarcts of bil. superior and inferior renal poles.  PMH Includes: Chronic systolic dysfunction of Lt ventricle, HTN, keloid, mild mitral regurgitation, nonischemic cardiomyopathy, A-FIb > s/p ablation   OT comments  Pt progressing well. Pt reports worsening with vision in R eye (has glaucoma at baseline), but pt using compensatory strategies to look to R. Pt nearly modified independence with ADL and mobility with RW. Pt cognitively, appears intact with problem solving, sequencing and multi-step commands.  Pt would benefit from continued OT skilled services acutely.   Follow Up Recommendations  No OT follow up;Supervision - Intermittent    Equipment Recommendations  None recommended by OT    Recommendations for Other Services      Precautions / Restrictions Precautions Precautions: Fall Restrictions Weight Bearing Restrictions: No       Mobility Bed Mobility Overal bed mobility: Modified Independent             General bed mobility comments: getting off on L side of bed    Transfers Overall transfer level: Needs assistance Equipment used: Rolling walker (2 wheeled) Transfers: Sit to/from Stand Sit to Stand: Supervision              Balance Overall balance assessment: Needs assistance Sitting-balance support: No upper extremity supported;Feet supported Sitting balance-Leahy Scale: Good     Standing balance support: Bilateral upper extremity supported;During functional  activity Standing balance-Leahy Scale: Poor Standing balance comment: reliant on RW                           ADL either performed or assessed with clinical judgement   ADL Overall ADL's : Needs assistance/impaired                                     Functional mobility during ADLs: Supervision/safety;Rolling walker General ADL Comments: SupervisionA for mobility with RW     Vision   Vision Assessment?: Yes Additional Comments: Pt reports worsening with vision in R eye (has glaucoma at baseline), but pt using compensatory strategies to look to R.   Perception     Praxis      Cognition Arousal/Alertness: Awake/alert Behavior During Therapy: WFL for tasks assessed/performed Overall Cognitive Status: Within Functional Limits for tasks assessed                                 General Comments: Pt performing OOB ADL and multi-step sequencing tasks without physical assist or cues to assist. Pt appears WFLs.        Exercises     Shoulder Instructions       General Comments O2  >94% on RA with exertion    Pertinent Vitals/ Pain       Pain Assessment: 0-10 Pain Score: 0-No pain Pain Intervention(s): Monitored during session  Home Living  Prior Functioning/Environment              Frequency  Min 2X/week        Progress Toward Goals  OT Goals(current goals can now be found in the care plan section)  Progress towards OT goals: Progressing toward goals  Acute Rehab OT Goals Patient Stated Goal: to improve OT Goal Formulation: With patient/family Time For Goal Achievement: 01/03/21 Potential to Achieve Goals: Good  Plan Discharge plan needs to be updated    Co-evaluation                 AM-PAC OT "6 Clicks" Daily Activity     Outcome Measure   Help from another person eating meals?: None Help from another person taking care of personal grooming?:  None Help from another person toileting, which includes using toliet, bedpan, or urinal?: A Little Help from another person bathing (including washing, rinsing, drying)?: None Help from another person to put on and taking off regular upper body clothing?: None Help from another person to put on and taking off regular lower body clothing?: None 6 Click Score: 23    End of Session Equipment Utilized During Treatment: Gait belt  OT Visit Diagnosis: Unsteadiness on feet (R26.81)   Activity Tolerance Patient tolerated treatment well   Patient Left Other (comment) (standing with PT in hallway)   Nurse Communication Mobility status;Other (comment) (no need for O2)        Time: 6546-5035 OT Time Calculation (min): 21 min  Charges: OT General Charges $OT Visit: 1 Visit OT Treatments $Self Care/Home Management : 8-22 mins  Jefferey Pica, OTR/L Acute Rehabilitation Services Pager: [phone] Office: [phone]    Augustin Schooling 12/22/2020, 9:24 AM

## 2020-12-22 NOTE — Progress Notes (Addendum)
STROKE TEAM PROGRESS NOTE   SUBJECTIVE (INTERVAL HISTORY) Patient is doing well, resting in bed with wife at bedside. She states that he has been eating more and has more energy to work with physical therapy.  Wife and patient educated about stroke, stroke etiology and need to take Xarelto on a consistent basis.   Neurological exam is stable    OBJECTIVE Temp:  [97.8 F (36.6 C)-98.7 F (37.1 C)] 97.8 F (36.6 C) (06/28 0800) Pulse Rate:  [62-88] 72 (06/28 0800) Resp:  [12-23] 23 (06/28 0800) BP: (122-150)/(75-98) 139/82 (06/28 0800) SpO2:  [88 %-97 %] 94 % (06/28 0800)  Recent Labs  Lab 12/19/20 1221  GLUCAP 179*   Recent Labs  Lab 12/19/20 1217 12/19/20 1325 12/20/20 0923 12/21/20 0422 12/22/20 0320  NA 132* 137 135 135 135  K 3.3* 3.4* 3.7 4.0 3.9  CL 97* 101 102 100 100  CO2 22  --  24 28 27   GLUCOSE 179* 173* 153* 137* 119*  BUN 12 12 15 18 20   CREATININE 1.12 0.90 0.97 0.91 0.95  CALCIUM 9.2  --  9.4 9.3 9.3   Recent Labs  Lab 12/19/20 1217  AST 70*  ALT 42  ALKPHOS 195*  BILITOT 1.5*  PROT 7.4  ALBUMIN 3.9   Recent Labs  Lab 12/19/20 1217 12/19/20 1325 12/20/20 0923 12/21/20 0422 12/22/20 0320  WBC 10.9*  --  13.8* 13.4* 11.2*  NEUTROABS 8.8*  --   --   --   --   HGB 15.0 15.6 15.4 15.1 14.9  HCT 43.2 46.0 44.6 44.1 43.2  MCV 97.3  --  96.1 96.9 97.3  PLT 165  --  179 184 179   No results for input(s): CKTOTAL, CKMB, CKMBINDEX, TROPONINI in the last 168 hours. Recent Labs    12/19/20 1217  LABPROT 16.3*  INR 1.3*   No results for input(s): COLORURINE, LABSPEC, PHURINE, GLUCOSEU, HGBUR, BILIRUBINUR, KETONESUR, PROTEINUR, UROBILINOGEN, NITRITE, LEUKOCYTESUR in the last 72 hours.  Invalid input(s): APPERANCEUR     Component Value Date/Time   CHOL 125 12/20/2020 0235   TRIG 40 12/20/2020 0235   HDL 34 (L) 12/20/2020 0235   CHOLHDL 3.7 12/20/2020 0235   VLDL 8 12/20/2020 0235   LDLCALC 83 12/20/2020 0235   Lab Results  Component  Value Date   HGBA1C 6.9 (H) 12/20/2020   No results found for: LABOPIA, COCAINSCRNUR, LABBENZ, AMPHETMU, THCU, LABBARB  No results for input(s): ETH in the last 168 hours.  I have personally reviewed the radiological images below and agree with the radiology interpretations.  CT HEAD WO CONTRAST  Result Date: 12/19/2020 CLINICAL DATA:  Stroke follow-up, nausea vomiting post tPA. Worsened facial droop. EXAM: CT HEAD WITHOUT CONTRAST TECHNIQUE: Contiguous axial images were obtained from the base of the skull through the vertex without intravenous contrast. COMPARISON:  CT angiography head 12/19/2020, CT head 12/19/2020 1:35 p.m. 5 FINDINGS: Brain: Cerebral ventricle sizes are concordant with the degree of cerebral volume loss. No evidence of large-territorial acute infarction. No parenchymal hemorrhage. No mass lesion. No extra-axial collection. No mass effect or midline shift. No hydrocephalus. Basilar cisterns are patent. Vascular: No hyperdense vessel. Atherosclerotic calcifications are present within the cavernous internal carotid arteries. Skull: No acute fracture or focal lesion. Sinuses/Orbits: Paranasal sinuses and mastoid air cells are clear. The orbits are unremarkable. Other: None. IMPRESSION: No acute intracranial abnormality. Electronically Signed   By: Iven Finn M.D.   On: 12/19/2020 15:05   CT CHEST W CONTRAST  Result Date: 12/19/2020 CLINICAL DATA:  Nausea/vomiting, diarrhea.  Stroke. EXAM: CT CHEST, ABDOMEN, AND PELVIS WITH CONTRAST TECHNIQUE: Multidetector CT imaging of the chest, abdomen and pelvis was performed following the standard protocol during bolus administration of intravenous contrast. CONTRAST:  57mL OMNIPAQUE IOHEXOL 350 MG/ML SOLN COMPARISON:  None. FINDINGS: CT CHEST FINDINGS Cardiovascular: Enlarged bilateral atria. No significant pericardial effusion. The thoracic aorta is normal in caliber. Mild atherosclerotic plaque of the thoracic aorta. Four-vessel coronary  artery calcifications. Mediastinum/Nodes: No enlarged mediastinal, hilar, or axillary lymph nodes. Thyroid gland, trachea, and esophagus demonstrate no significant findings. Lungs/Pleura: No focal consolidation. No pulmonary nodule. No pulmonary mass. No pleural effusion or pneumothorax. Musculoskeletal: No chest wall abnormality. No suspicious lytic or blastic osseous lesions. No acute displaced fracture. Multilevel degenerative changes of the spine. CT ABDOMEN PELVIS FINDINGS Hepatobiliary: No focal liver abnormality. No gallstones, gallbladder wall thickening, or pericholecystic fluid. No biliary dilatation. Pancreas: No focal lesion. Normal pancreatic contour. No surrounding inflammatory changes. No main pancreatic ductal dilatation. Spleen: Normal in size without focal abnormality. Adrenals/Urinary Tract: No adrenal nodule bilaterally. The right kidney enhances homogeneous Lea. The left kidney demonstrates both superior and inferior pole hypodense wedge-shaped densities. Subcentimeter hypodensities are too small to characterize. No hydronephrosis. No hydroureter. The urinary bladder is unremarkable. On delayed imaging, there is no urothelial wall thickening and there are no filling defects in the opacified portions of the bilateral collecting systems or ureters. Stomach/Bowel: Stomach is within normal limits. No evidence of bowel wall thickening or dilatation. Appendix appears normal. Vascular/Lymphatic: At least moderate to severe calcified and noncalcified atherosclerotic plaque. No abdominal aorta. Aneurysmal dilatation of the proximal left common iliac artery measuring up to 1.7 cm extending for approximately 2 cm in the craniocaudal dimension with associated large volume eccentric noncalcified plaque. Mild atherosclerotic plaque of the aorta and its branches. No abdominal, pelvic, or inguinal lymphadenopathy. Reproductive: Prostate is unremarkable. Other: No intraperitoneal free fluid. No intraperitoneal  free gas. No organized fluid collection. Musculoskeletal: No abdominal wall hernia or abnormality. Cortical thickening and diffuse sclerotic appearance of the pelvis, right greater than left. No acute displaced fracture. Multilevel degenerative changes of the spine. IMPRESSION: 1. 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 2. 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 3. Aortic Atherosclerosis (ICD10-I70.0) including four-vessel coronary artery calcifications. 4. Cardiomegaly. 5. Diffuse sclerotic pelvic appearance suggestive of Paget's disease. Recommend correlation with prior x-ray cross-sectional imaging of the pelvis. Electronically Signed   By: Iven Finn M.D.   On: 12/19/2020 15:32   MR BRAIN WO CONTRAST  Result Date: 12/20/2020 CLINICAL DATA:  Neuro deficit, acute stroke suspected EXAM: MRI HEAD WITHOUT CONTRAST TECHNIQUE: Multiplanar, multiecho pulse sequences of the brain and surrounding structures were obtained without intravenous contrast. COMPARISON:  December 19, 2020 CT head. 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 Slight edema in the left cerebellum without significant mass effect. Areas of apparent restricted diffusion along the paramidline high frontal lobes are likely artifactual, related to falcine calcification. Moderate scattered T2/FLAIR hyperintensities within the white matter, likely related to chronic microvascular ischemic disease. Mild atrophy with ex vacuo ventricular dilation. No hydrocephalus. No evidence of acute hemorrhage, extra-axial fluid collection, mass lesion or midline shift. Vascular: Major arterial flow voids are maintained at the skull base. Small left vertebral artery, further evaluated on recent CTA. Skull  and upper cervical spine: Normal  marrow signal. Sinuses/Orbits: Mild ethmoid air cell mucosal thickening. Unremarkable orbits. Other: No mastoid effusions. IMPRESSION: 1. 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 Slight edema in the left cerebellum without mass effect. 2. Moderate chronic microvascular ischemic disease and mild atrophy. Electronically Signed   By: Margaretha Sheffield MD   On: 12/20/2020 12:18   CT ABDOMEN PELVIS W CONTRAST  Result Date: 12/19/2020 CLINICAL DATA:  Nausea/vomiting, diarrhea.  Stroke. EXAM: CT CHEST, ABDOMEN, AND PELVIS WITH CONTRAST TECHNIQUE: Multidetector CT imaging of the chest, abdomen and pelvis was performed following the standard protocol during bolus administration of intravenous contrast. CONTRAST:  39mL OMNIPAQUE IOHEXOL 350 MG/ML SOLN COMPARISON:  None. FINDINGS: CT CHEST FINDINGS Cardiovascular: Enlarged bilateral atria. No significant pericardial effusion. The thoracic aorta is normal in caliber. Mild atherosclerotic plaque of the thoracic aorta. Four-vessel coronary artery calcifications. Mediastinum/Nodes: No enlarged mediastinal, hilar, or axillary lymph nodes. Thyroid gland, trachea, and esophagus demonstrate no significant findings. Lungs/Pleura: No focal consolidation. No pulmonary nodule. No pulmonary mass. No pleural effusion or pneumothorax. Musculoskeletal: No chest wall abnormality. No suspicious lytic or blastic osseous lesions. No acute displaced fracture. Multilevel degenerative changes of the spine. CT ABDOMEN PELVIS FINDINGS Hepatobiliary: No focal liver abnormality. No gallstones, gallbladder wall thickening, or pericholecystic fluid. No biliary dilatation. Pancreas: No focal lesion. Normal pancreatic contour. No surrounding inflammatory changes. No main pancreatic ductal dilatation. Spleen: Normal in size without focal abnormality. Adrenals/Urinary Tract: No adrenal nodule bilaterally. The right  kidney enhances homogeneous Lea. The left kidney demonstrates both superior and inferior pole hypodense wedge-shaped densities. Subcentimeter hypodensities are too small to characterize. No hydronephrosis. No hydroureter. The urinary bladder is unremarkable. On delayed imaging, there is no urothelial wall thickening and there are no filling defects in the opacified portions of the bilateral collecting systems or ureters. Stomach/Bowel: Stomach is within normal limits. No evidence of bowel wall thickening or dilatation. Appendix appears normal. Vascular/Lymphatic: At least moderate to severe calcified and noncalcified atherosclerotic plaque. No abdominal aorta. Aneurysmal dilatation of the proximal left common iliac artery measuring up to 1.7 cm extending for approximately 2 cm in the craniocaudal dimension with associated large volume eccentric noncalcified plaque. Mild atherosclerotic plaque of the aorta and its branches. No abdominal, pelvic, or inguinal lymphadenopathy. Reproductive: Prostate is unremarkable. Other: No intraperitoneal free fluid. No intraperitoneal free gas. No organized fluid collection. Musculoskeletal: No abdominal wall hernia or abnormality. Cortical thickening and diffuse sclerotic appearance of the pelvis, right greater than left. No acute displaced fracture. Multilevel degenerative changes of the spine. IMPRESSION: 1. 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 2. 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 3. Aortic Atherosclerosis (ICD10-I70.0) including four-vessel coronary artery calcifications. 4. Cardiomegaly. 5. Diffuse sclerotic pelvic appearance suggestive of Paget's disease. Recommend correlation with prior x-ray cross-sectional imaging of the pelvis. Electronically Signed   By: Iven Finn M.D.   On: 12/19/2020 15:32   ECHOCARDIOGRAM COMPLETE  Result Date: 12/19/2020     ECHOCARDIOGRAM REPORT   Patient Name:   Edward Pittman Date of Exam: 12/19/2020 Medical Rec #:  [id number]        Height:       73.0 in Accession #:    [id number]       Weight:       234.0 lb Date of Birth:  20-Jan-1941  BSA:          2.300 m Patient Age:    43 years         BP:           165/85 mmHg Patient Gender: M                HR:           96 bpm. Exam Location:  Inpatient Procedure: 2D Echo, Cardiac Doppler, Color Doppler and Intracardiac            Opacification Agent Indications:    Stroke  History:        Patient has prior history of Echocardiogram examinations, most                 recent 03/12/2012. Arrythmias:Atrial Fibrillation; Risk                 Factors:Dyslipidemia and Hypertension. S/P ablation. Hx NICM.  Sonographer:    Clayton Lefort RDCS (AE) Referring Phys: [id number] [place]  1. Left ventricular ejection fraction, by estimation, is 60 to 65%. The left ventricle has normal function. The left ventricle has no regional wall motion abnormalities. There is mild concentric left ventricular hypertrophy. Left ventricular diastolic function could not be evaluated.  2. Right ventricular systolic function is mildly reduced. The right ventricular size is mildly enlarged.  3. Left atrial size was mildly dilated.  4. Right atrial size was mildly dilated.  5. The mitral valve is grossly normal. Mild mitral valve regurgitation. No evidence of mitral stenosis.  6. The aortic valve is tricuspid. There is mild calcification of the aortic valve. Aortic valve regurgitation is not visualized. Mild aortic valve sclerosis is present, with no evidence of aortic valve stenosis. Conclusion(s)/Recommendation(s): No intracardiac source of embolism detected on this transthoracic study. A transesophageal echocardiogram is recommended to exclude cardiac source of embolism if clinically indicated. FINDINGS  Left Ventricle: Left ventricular ejection fraction, by estimation, is 60 to 65%. The left  ventricle has normal function. The left ventricle has no regional wall motion abnormalities. Definity contrast agent was given IV to delineate the left ventricular  endocardial borders. The left ventricular internal cavity size was normal in size. There is mild concentric left ventricular hypertrophy. Left ventricular diastolic function could not be evaluated due to atrial fibrillation. Left ventricular diastolic function could not be evaluated. Right Ventricle: The right ventricular size is mildly enlarged. No increase in right ventricular wall thickness. Right ventricular systolic function is mildly reduced. Left Atrium: Left atrial size was mildly dilated. Right Atrium: Right atrial size was mildly dilated. Pericardium: There is no evidence of pericardial effusion. Mitral Valve: The mitral valve is grossly normal. Mild mitral valve regurgitation. No evidence of mitral valve stenosis. Tricuspid Valve: The tricuspid valve is grossly normal. Tricuspid valve regurgitation is mild . No evidence of tricuspid stenosis. Aortic Valve: The aortic valve is tricuspid. There is mild calcification of the aortic valve. Aortic valve regurgitation is not visualized. Mild aortic valve sclerosis is present, with no evidence of aortic valve stenosis. Aortic valve mean gradient measures 4.6 mmHg. Aortic valve peak gradient measures 7.4 mmHg. Aortic valve area, by VTI measures 2.27 cm. Pulmonic Valve: The pulmonic valve was grossly normal. Pulmonic valve regurgitation is not visualized. No evidence of pulmonic stenosis. Aorta: The aortic root and ascending aorta are structurally normal, with no evidence of dilitation. Venous: The inferior vena cava was not well visualized. IAS/Shunts: The atrial septum is grossly normal.  LEFT VENTRICLE PLAX 2D LVIDd:         5.20 cm LVIDs:         3.20 cm LV PW:         1.20 cm LV IVS:        1.20 cm LVOT diam:     2.10 cm LV SV:         58 LV SV Index:   25 LVOT Area:     3.46 cm  RIGHT VENTRICLE  RV Basal diam:  4.10 cm RV Mid diam:    3.70 cm RV S prime:     10.40 cm/s TAPSE (M-mode): 1.6 cm LEFT ATRIUM              Index       RIGHT ATRIUM           Index LA diam:        4.60 cm  2.00 cm/m  RA Area:     37.80 cm LA Vol (A2C):   84.6 ml  36.78 ml/m RA Volume:   142.00 ml 61.74 ml/m LA Vol (A4C):   102.3 ml 44.45 ml/m LA Biplane Vol: 94.3 ml  41.00 ml/m  AORTIC VALVE AV Area (Vmax):    2.28 cm AV Area (Vmean):   2.18 cm AV Area (VTI):     2.27 cm AV Vmax:           136.40 cm/s AV Vmean:          101.100 cm/s AV VTI:            0.257 m AV Peak Grad:      7.4 mmHg AV Mean Grad:      4.6 mmHg LVOT Vmax:         89.60 cm/s LVOT Vmean:        63.680 cm/s LVOT VTI:          0.168 m LVOT/AV VTI ratio: 0.65  AORTA Ao Root diam: 3.80 cm Ao Asc diam:  3.60 cm MR Peak grad: 104.9 mmHg  TRICUSPID VALVE MR Mean grad: 74.0 mmHg   TR Peak grad:   32.0 mmHg MR Vmax:      512.00 cm/s TR Vmax:        283.00 cm/s MR Vmean:     411.0 cm/s                           SHUNTS                           Systemic VTI:  0.17 m                           Systemic Diam: 2.10 cm Eleonore Chiquito MD Electronically signed by Eleonore Chiquito MD Signature Date/Time: 12/19/2020/4:39:55 PM    Final    CT HEAD CODE STROKE WO CONTRAST  Result Date: 12/19/2020 CLINICAL DATA:  Code stroke.  Neuro deficit, acute stroke suspected. EXAM: CT HEAD WITHOUT CONTRAST TECHNIQUE: Contiguous axial images were obtained from the base of the skull through the vertex without intravenous contrast. COMPARISON:  None. FINDINGS: Brain: No evidence of acute large vascular territory infarction, acute hemorrhage, hydrocephalus, extra-axial collection or mass lesion/mass effect. Patchy white matter hypoattenuation, most likely related to moderate chronic microvascular ischemic disease. Ossification along the falx. Vascular: No hyperdense vessel identified. Calcific intracranial atherosclerosis. Skull: No acute  fracture. Sinuses/Orbits: Visualized sinuses are clear.  No acute orbital abnormality. Other: No mastoid effusions. ASPECTS [facility] Stroke Program Early CT Score) Total score (0-10 with 10 being normal): 10. IMPRESSION: 1. No evidence of acute large vascular territory infarct or acute hemorrhage. ASPECTS is 10. 2. Moderate chronic microvascular ischemic disease. Code stroke imaging results were communicated on 12/19/2020 at 12:28 pm to provider [place] via telephone, who verbally acknowledged these results. Electronically Signed   By: Margaretha Sheffield MD   On: 12/19/2020 12:35   CT ANGIO HEAD CODE STROKE  Result Date: 12/19/2020 CLINICAL DATA:  Code stroke. Right-sided facial droop, numbness and slurred speech. EXAM: CT ANGIOGRAPHY HEAD AND NECK TECHNIQUE: Multidetector CT imaging of the head and neck was performed using the standard protocol during bolus administration of intravenous contrast. Multiplanar CT image reconstructions and MIPs were obtained to evaluate the vascular anatomy. Carotid stenosis measurements (when applicable) are obtained utilizing NASCET criteria, using the distal internal carotid diameter as the denominator. CONTRAST:  53mL OMNIPAQUE IOHEXOL 350 MG/ML SOLN COMPARISON:  Same day code stroke CT head FINDINGS: CTA NECK FINDINGS Aortic arch: Great vessel origins are patent. Calcific and noncalcific atherosclerosis of the visualized aortic arch. Right carotid system: No evidence of dissection, stenosis (50% or greater) or occlusion.Mild calcific and noncalcific atherosclerosis at the carotid bifurcation. Left carotid system: No evidence of dissection, stenosis (50% or greater) or occlusion. Mild calcific and noncalcific atherosclerosis at the carotid bifurcation. 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 Skeleton:  Moderate multilevel cervical degenerative disease with disc height loss, endplate sclerosis and endplate spurring. Other neck: No acute abnormality. Upper chest: Visualized lung apices are clear. Review of the MIP images confirms the above findings CTA HEAD FINDINGS Anterior circulation: No definite large vessel occlusion or proximal hemodynamically significant stenosis. Hypoplastic or absent right A1 ACA, likely congenital. The right A2 ACA appears to arise from the left A1 ACA. No aneurysm identified. Posterior circulation: Diminutive opacification of a small intradural left vertebral artery. Right intradural vertebral artery is patent. Basilar artery is small with bilateral fetal type PCAs, anatomic variant. Bilateral PCAs are patent proximally without proximal flow limiting stenosis. The distal PCAs are poorly characterized due to venous contamination. No aneurysm identified. Venous sinuses: As permitted by contrast timing, patent. Anatomic variants: See above. Review of the MIP images confirms the above findings IMPRESSION: CTA head: 1. No emergent large vessel occlusion or proximal hemodynamically significant stenosis. 2. Limited evaluation of the distal vasculature due to venous contamination. 3. Small vertebrobasilar system with bilateral fetal type PCAs, anatomic variant. CTA Neck: 1. 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 2. 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 3. Mild carotid bifurcation atherosclerosis without significant (greater than 50%) carotid stenosis in the neck. Findings discussed with Dr. Lorrin Goodell via telephone 1:29 p.m. Electronically Signed   By: Margaretha Sheffield MD   On: 12/19/2020 13:39   CT ANGIO NECK CODE STROKE  Result Date: 12/19/2020 CLINICAL DATA:  Code stroke. Right-sided  facial droop, numbness and slurred speech. EXAM: CT ANGIOGRAPHY HEAD AND NECK TECHNIQUE: Multidetector CT imaging of the head and neck was performed using the  standard protocol during bolus administration of intravenous contrast. Multiplanar CT image reconstructions and MIPs were obtained to evaluate the vascular anatomy. Carotid stenosis measurements (when applicable) are obtained utilizing NASCET criteria, using the distal internal carotid diameter as the denominator. CONTRAST:  24mL OMNIPAQUE IOHEXOL 350 MG/ML SOLN COMPARISON:  Same day code stroke CT head FINDINGS: CTA NECK FINDINGS Aortic arch: Great vessel origins are patent. Calcific and noncalcific atherosclerosis of the visualized aortic arch. Right carotid system: No evidence of dissection, stenosis (50% or greater) or occlusion.Mild calcific and noncalcific atherosclerosis at the carotid bifurcation. Left carotid system: No evidence of dissection, stenosis (50% or greater) or occlusion. Mild calcific and noncalcific atherosclerosis at the carotid bifurcation. 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 Skeleton: Moderate multilevel cervical degenerative disease with disc height loss, endplate sclerosis and endplate spurring. Other neck: No acute abnormality. Upper chest: Visualized lung apices are clear. Review of the MIP images confirms the above findings CTA HEAD FINDINGS Anterior circulation: No definite large vessel occlusion or proximal hemodynamically significant stenosis. Hypoplastic or absent right A1 ACA, likely congenital. The right A2 ACA appears to arise from the left A1 ACA. No aneurysm identified. Posterior circulation: Diminutive opacification of a small intradural left vertebral artery. Right intradural vertebral  artery is patent. Basilar artery is small with bilateral fetal type PCAs, anatomic variant. Bilateral PCAs are patent proximally without proximal flow limiting stenosis. The distal PCAs are poorly characterized due to venous contamination. No aneurysm identified. Venous sinuses: As permitted by contrast timing, patent. Anatomic variants: See above. Review of the MIP images confirms the above findings IMPRESSION: CTA head: 1. No emergent large vessel occlusion or proximal hemodynamically significant stenosis. 2. Limited evaluation of the distal vasculature due to venous contamination. 3. Small vertebrobasilar system with bilateral fetal type PCAs, anatomic variant. CTA Neck: 1. 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 2. 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 3. Mild carotid bifurcation atherosclerosis without significant (greater than 50%) carotid stenosis in the neck. Findings discussed with Dr. Lorrin Goodell via telephone 1:29 p.m. Electronically Signed   By: Margaretha Sheffield MD   On: 12/19/2020 13:39     PHYSICAL EXAM  Temp:  [97.8 F (36.6 C)-98.7 F (37.1 C)] 97.8 F (36.6 C) (06/28 0800) Pulse Rate:  [62-88] 72 (06/28 0800) Resp:  [12-23] 23 (06/28 0800) BP: (122-150)/(75-98) 139/82 (06/28 0800) SpO2:  [88 %-97 %] 94 % (06/28 0800)  General - Well nourished, well developed, in no apparent distress.  Ophthalmologic - fundi not visualized due to noncooperation.  Cardiovascular - Regular rhythm and rate.  Mental Status -  Level of arousal and orientation to time, place, and person were intact. Language including expression, naming, repetition, comprehension was assessed and found intact. Fund of Knowledge was assessed and was intact.  Cranial Nerves II - XII - II - Visual field intact  OU. III, IV, VI - Extraocular movements intact. V - Facial sensation intact bilaterally. VII - mild right nasolabial fold flattening. VIII - Hearing & vestibular intact bilaterally. X - Palate elevates symmetrically. XI - Chin turning & shoulder shrug intact bilaterally. XII - Tongue protrusion intact.  Motor Strength - The patient's strength was normal in all extremities and pronator drift was absent.  Bulk was normal and fasciculations were absent.   Motor Tone - Muscle tone was assessed at the neck and appendages and was normal.  Reflexes - The patient's reflexes were symmetrical in all extremities and he had no pathological reflexes.  Sensory - Light touch, temperature/pinprick were assessed and were symmetrical.    Coordination - The patient had normal movements in the hands with no ataxia or dysmetria.  Tremor was absent.  Gait and Station - deferred.   ASSESSMENT/PLAN Edward Pittman is a 80 y.o. male with history of afib off Xarelto for a week, cardiomyopathy, HTN and HLD admitted for dizziness, right facial droop and right arm weakness. 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 secondary to afib out of AC CT head no acute finding CTA head and neck b/l VA origin stenosis MRI showed small acute stroke in bilateral cerebellar hemispheres, junction of the pons and right middle cerebellar peduncle 2D Echo EF 60-65% LDL 83 HgbA1c 6.9  SCDs for VTE prophylaxis Xarelto (rivaroxaban) daily prior to admission but off for the last one week, now on No antithrombotic within 24h of tPA Patient counseled to be compliant with his antithrombotic medications Ongoing aggressive stroke risk factor management Therapy recommendations: PT recommending home with outpatient PT  Disposition:  pending  Afib, chronic Home med including cardizem and metoprolol resumed this admission  HR 90s-120s BP monitoring Off Xarelto for a week, did not  refill   CHF/cardiomyopathy Home meds including metoprolol, lasix PRN, farxiga  EF 60-65% On low dose metoprolol to avoid low BP and bradycardia Resume farxiga Follows with Dr. Percival Spanish   Episode of bradycardia in the setting of nausea dry heaving Likely vasovagal effect On zofran Encourage gradually resume diet (pt not eating for 2 days) Tele monitoring  Hypertension Stable BP goal < 180/105 Long term BP goal normotensive   Hyperlipidemia Home meds:  zocor 40  LDL 83, goal < 70 Now on lipitor 40 Continue statin at discharge  Other Stroke Risk Factors Advanced age Former smoker Obesity, Body mass index is 30.87 kg/m.   Other Active Problems Leukocytosis WBC 10.9->13.8 Hypokalemia K 3.3->3.7  Ruta Hinds, NP  Stroke Nurse Practitioner  Patient seen and discussed with attending physician   Hospital day # 3  Wife at the bedside.  Patient doing well overnight, no acute event, heart rate much improved after resume home dose of metoprolol.  Appetite improved, eating better, no more nausea vomiting episode.  Mild neuro deficit unchanged.  PT/OT recommend outpatient PT/OT at this time.  Xarelto restarted, tolerating well.  He has appointment with Dr. Percival Spanish cardiology on 12/24/2020.  He will also follow-up with stroke clinic in [place] in 4 weeks.   For detailed assessment and plan, please refer to above as I have made changes wherever appropriate.   Rosalin Hawking, MD PhD Stroke Neurology 12/21/2020 4:04 PM     To contact Stroke Continuity provider, please refer to [email or web address] After hours, contact General Neurology

## 2020-12-22 NOTE — Progress Notes (Signed)
Physical Therapy Treatment Patient Details Name: Edward Pittman MRN: [id number] DOB: 04-13-1941 Today's Date: 12/22/2020    History of Present Illness This 80 y.o. male admitted 6/24 with new onset dizziness and Rt sided weakness and facial droop.  Pt received tPA.  CT of head negative for intracranial abnormality, CTA > no LVO.  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 CT of abdomen and pelvis showed infarcts of bil. superior and inferior renal poles.  PMH Includes: Chronic systolic dysfunction of Lt ventricle, HTN, keloid, mild mitral regurgitation, nonischemic cardiomyopathy, A-FIb > s/p ablation    PT Comments    Pt making steady progress towards his physical therapy goals. Session focused on continued gait and stair training prior to discharge home. Pt ambulating hallway unit with a walker and negotiated a half flight of steps with a right railing. Continue to recommend RW currently for increased stability in addition to OPPT follow up to address balance training and strengthening.    Follow Up Recommendations  Supervision for mobility/OOB;Outpatient PT (neuro)     Equipment Recommendations  Rolling walker with 5" wheels    Recommendations for Other Services Rehab consult     Precautions / Restrictions Precautions Precautions: Fall Restrictions Weight Bearing Restrictions: No    Mobility  Bed Mobility Overal bed mobility: Modified Independent             General bed mobility comments: OOB with OT upon entry    Transfers Overall transfer level: Needs assistance Equipment used: Rolling walker (2 wheeled) Transfers: Sit to/from Stand Sit to Stand: Supervision            Ambulation/Gait Ambulation/Gait assistance: Min guard Gait Distance (Feet): 250 Feet Assistive device: Rolling walker (2 wheeled) Gait Pattern/deviations: Step-through pattern;Decreased stride  length Gait velocity: reduced   General Gait Details: Mild dynamic instability, but no overt LOB with bilateral handheld support on walker. Min guard for safety. Able to perform stops/starts, head turns without loss of balance. One mild lateral LOB with turning to left abruptly. Unable to significantly alter pace when cued   Stairs Stairs: Yes Stairs assistance: Min guard Stair Management: One rail Right Number of Stairs: 12 General stair comments: Cues for step by step and slowing pace for safety   Wheelchair Mobility    Modified Rankin (Stroke Patients Only) Modified Rankin (Stroke Patients Only) Pre-Morbid Rankin Score: No symptoms Modified Rankin: Moderately severe disability     Balance Overall balance assessment: Needs assistance Sitting-balance support: No upper extremity supported;Feet supported Sitting balance-Leahy Scale: Good     Standing balance support: During functional activity;No upper extremity supported Standing balance-Leahy Scale: Fair Standing balance comment: reliant on RW                            Cognition Arousal/Alertness: Awake/alert Behavior During Therapy: WFL for tasks assessed/performed Overall Cognitive Status: Within Functional Limits for tasks assessed                                 General Comments: Pt performing OOB ADL and multi-step sequencing tasks without physical assist or cues to assist. Pt appears WFLs.      Exercises      General Comments General comments (skin integrity, edema, etc.): O2  >94% on RA with exertion      Pertinent  Vitals/Pain Pain Assessment: Faces Pain Score: 0-No pain Faces Pain Scale: No hurt Pain Intervention(s): Monitored during session    Home Living                      Prior Function            PT Goals (current goals can now be found in the care plan section) Acute Rehab PT Goals Patient Stated Goal: to improve PT Goal Formulation: With  patient/family Time For Goal Achievement: 01/03/21 Potential to Achieve Goals: Good Progress towards PT goals: Progressing toward goals    Frequency    Min 4X/week      PT Plan Current plan remains appropriate    Co-evaluation              AM-PAC PT "6 Clicks" Mobility   Outcome Measure  Help needed turning from your back to your side while in a flat bed without using bedrails?: None Help needed moving from lying on your back to sitting on the side of a flat bed without using bedrails?: None Help needed moving to and from a bed to a chair (including a wheelchair)?: A Little Help needed standing up from a chair using your arms (e.g., wheelchair or bedside chair)?: A Little Help needed to walk in hospital room?: A Little Help needed climbing 3-5 steps with a railing? : A Little 6 Click Score: 20    End of Session Equipment Utilized During Treatment: Gait belt Activity Tolerance: Patient tolerated treatment well Patient left: with call bell/phone within reach;in chair;with chair alarm set;with family/visitor present Nurse Communication: Mobility status PT Visit Diagnosis: Unsteadiness on feet (R26.81);Other abnormalities of gait and mobility (R26.89);Difficulty in walking, not elsewhere classified (R26.2);Other symptoms and signs involving the nervous system (R29.898);Dizziness and giddiness (R42)     Time: 7366-8159 PT Time Calculation (min) (ACUTE ONLY): 11 min  Charges:  $Therapeutic Activity: 8-22 mins                       Edward Pittman, PT, DPT Acute Rehabilitation Services Pager [phone] Office [phone]    Deno Etienne 12/22/2020, 10:07 AM

## 2020-12-22 NOTE — Progress Notes (Signed)
CSW received request to assist patient in obtaining a handicap parking pass. CSW printed form and had MD sign it stating 6 month eligibility; patient to take it to the [facility] office.   Gilmore Laroche, MSW, [facility]

## 2020-12-22 NOTE — Discharge Summary (Addendum)
Stroke Discharge Summary  Patient ID:         Edward Pittman                                                                   MRN: [id number]                                                                DOB: Mar 23, 1941   Date of Admission: 12/19/2020 Date of Discharge: 12/22/2020   Attending Physician:  Dr. Erlinda Hong Consultant(s):   None Patient's PCP:  [name], MD   DISCHARGE DIAGNOSIS:  Principal Problem:   Embolic stroke [facility])     Allergies as of 12/21/2020         Reactions    Benazepril Cough    Metformin And Related Nausea Only            Medication List       STOP taking these medications     simvastatin 40 MG tablet Commonly known as: ZOCOR           TAKE these medications     atorvastatin 40 MG tablet Commonly known as: LIPITOR Take 1 tablet (40 mg total) by mouth daily. Start taking on: December 22, 2020    B Complete Tabs Take 1 tablet by mouth daily with breakfast.    dapagliflozin propanediol 10 MG Tabs tablet Commonly known as: Farxiga Take 1 tablet (10 mg total) by mouth daily before breakfast.    diltiazem 240 MG 24 hr capsule Commonly known as: CARDIZEM CD Take 1 capsule (240 mg total) by mouth daily.    furosemide 40 MG tablet Commonly known as: LASIX Take 40 mg by mouth daily as needed for fluid.    Garlique 400 MG Tbec Generic drug: Garlic Take 338 mg by mouth daily.    latanoprost 0.005 % ophthalmic solution Commonly known as: XALATAN Place 1 drop into both eyes at bedtime.    NON FORMULARY Take 1-2 tablets by mouth See admin instructions. HumanN SuperBeets Heart Chews- Chew 1-2 squares daily    OMEGA-3 PO Take 1 capsule by mouth daily with breakfast.    VITAMIN A PO Take 1 capsule by mouth daily with breakfast.           ASK your doctor about these medications     metoprolol tartrate 100 MG tablet Commonly known as: LOPRESSOR Take 1 tablet (100 mg total) by mouth 2 (two) times daily. NEED OV.    Xarelto 20 MG  Tabs tablet Generic drug: rivaroxaban TAKE 1 TABLET(20 MG) BY MOUTH DAILY WITH SUPPER             Pertinent Imaging   12/19/20 CT Head 1. No evidence of acute large vascular territory infarct or acute hemorrhage. ASPECTS is 10. 2. Moderate chronic microvascular ischemic disease.   12/19/20 CTA Head and Neck CTA Head: 1. No emergent large vessel occlusion or proximal hemodynamically significant stenosis. 2. Limited evaluation of the distal vasculature due to venous contamination. 3. Small vertebrobasilar system  with bilateral fetal type PCAs anatomic variant.   CTA Neck: 1. 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 2. 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 3. Mild carotid bifurcation atherosclerosis without significant (greater than 50%) carotid stenosis in the neck.   12/20/20 MRI Brain WO Contrast 1. 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 Slight edema in the left cerebellum without mass effect. 2. Moderate chronic microvascular ischemic disease and mild atrophy.   12/19/20 Echo Complete 1. Left ventricular ejection fraction, by estimation, is 60 to 65%. The left ventricle has normal function. The left ventricle has no regional wall motion abnormalities. There is mild concentric left ventricular hypertrophy. Left ventricular diastolic function could not be evaluated.   2. Right ventricular systolic function is mildly reduced. The right ventricular size is mildly enlarged.   3. Left atrial size was mildly dilated.   4. Right atrial size was mildly dilated.   5. The mitral valve is grossly normal. Mild mitral valve regurgitation. No evidence of mitral stenosis.   6. The aortic valve is tricuspid. There  is mild calcification of the aortic valve. Aortic valve regurgitation is not visualized. Mild aortic valve sclerosis is present, with no evidence of aortic valve stenosis.     History of Present Edward Pittman is a 80 y.o. male with PMH significant for Afibb who ran out of Xarelto a week ago, hx of cardiomyopathy, HLD, HTN who woke up with some dizziness in the morning which resolved. Around 1100, he had sudden onset R sided weakness and R facial droop for which EMS was called. Noted to have R neglect and brought in as a stroke code.   CT Head without contrast with no ICH.   mRS: 0 tPA: offered and patient opted for tPA at 1228. I revisited the discussion with wife later and she agreed to keep it going. Thrombectomy: No LVO, may be ?L PCA P2 stenosis with flow distally.   Physical Examination General - Well nourished, well developed, in no apparent distress. Ophthalmologic - fundi not visualized due to noncooperation. Cardiovascular - Regular rhythm and rate.   Mental Status - Level of arousal and orientation to time, place, and person were intact. Language including expression, naming, repetition, comprehension was assessed and found intact. Fund of Knowledge was assessed and was intact.   Cranial Nerves II - XII - II - Visual field intact OU. III, IV, VI - Extraocular movements intact. V - Facial sensation intact bilaterally. VII - mild right nasolabial fold flattening. VIII - Hearing & vestibular intact bilaterally. X - Palate elevates symmetrically. XI - Chin turning & shoulder shrug intact bilaterally. XII - Tongue protrusion intact.   Motor Strength - The patient's strength was normal in all extremities and pronator drift was absent.  Bulk was normal and fasciculations were absent.   Motor Tone - Muscle tone was assessed at the neck and appendages and was normal.   Reflexes - The patient's reflexes were symmetrical in all extremities and he had no pathological  reflexes.   Sensory - Light touch, temperature/pinprick were assessed and were symmetrical.     Coordination - The patient had normal movements in the hands with no ataxia or dysmetria.  Tremor was absent.   Gait and Station -  deferred.   Assessment and Plan   [name] is a 80 y.o. male with history of afib off Xarelto for a week, cardiomyopathy, HTN and HLD admitted for dizziness, right facial droop and right arm weakness. Received IVTPA. Thrombectomy not done due to lack of LVO.   Stroke:  Left brain infarct s/p tPA likely embolic secondary to afib out of AC CT head no acute finding CTA head and neck b/l VA origin stenosis MRI showed small acute stroke in bilateral cerebellar hemispheres, junction of the pons and right middle cerebellar peduncle 2D Echo EF 60-65% LDL 83 HgbA1c 6.9 SCDs for VTE prophylaxis Xarelto (rivaroxaban) daily prior to admission but off for the last one week, restarted on 6/26 and discharged on Xarelto. Provided with patient assistance information given difficulty obtaining Xarelto due to cost Patient counseled to be compliant with his antithrombotic medications Ongoing aggressive stroke risk factor management Therapy recommendations: Home health PT Disposition:  Discharged to home with home health PT and OT. Also recommended rolling walker.  Will follow up in stroke clinic, referral placed at discharge   Afib, chronic Home med including cardizem and metoprolol resumed this admission  Resumed home meds Rate controlled now Xarelto re initiated on 12/20/20  Follow-up with Dr. Percival Spanish cardiology on 12/24/2020 as scheduled.   CHF/cardiomyopathy Home meds including metoprolol, lasix PRN, farxiga- will continue at discharge EF 60-65% Follows with Dr. Percival Spanish on 12/24/2020 and scheduled.   Hyperlipidemia Home meds:  Simvastatin 40 mg QD transitioned to Atorvastatin 40 mg this admission, will continue at discharge LDL 83, goal < 70 Continue statin  at discharge   Other Stroke Risk Factors Advanced age Former smoker Obesity, Body mass index is 30.87 kg/m.     Ruta Hinds, NP Stroke Service Nurse Practitioner Patient seen and discussed with attending physician Dr. Erlinda Hong   35 minutes were spent preparing this discharge   Hospital day # 2   ATTENDING NOTE: I reviewed above note and agree with the assessment and plan. Pt was seen and examined.    No family at bedside, patient sitting in chair, worked with OT today no recommendations.  Still need outpatient PT on discharge.  He has no complaints, no acute event overnight, no significant neuro deficit.  Xarelto restarted, tolerating well.  Will discharge home today.  He has appointment with Dr. Percival Spanish cardiology on 12/24/2020.  He will also follow-up with stroke clinic in [place] in 4 weeks.   For detailed assessment and plan, please refer to above as I have made changes wherever appropriate.   Rosalin Hawking, MD PhD Stroke Neurology 12/22/2020 [phone]

## 2020-12-22 NOTE — Therapy (Signed)
Pt has made remarkable improvement. He is modified independence with ADL and ADL functional mobility in room with RW. Pt appears WFLs for cognition. Pt does not require follow-up OT skilled services. 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  OT note to follow.   Jefferey Pica, OTR/L Acute Rehabilitation Services Pager: [phone] Office: [phone]

## 2020-12-24 ENCOUNTER — Ambulatory Visit: Payer: Medicare HMO | Admitting: Cardiology

## 2020-12-24 ENCOUNTER — Other Ambulatory Visit: Payer: Self-pay

## 2020-12-24 ENCOUNTER — Encounter: Payer: Self-pay | Admitting: Cardiology

## 2020-12-24 VITALS — BP 138/80 | HR 84 | Ht 73.0 in | Wt 220.6 lb

## 2020-12-24 DIAGNOSIS — R0789 Other chest pain: Secondary | ICD-10-CM | POA: Diagnosis not present

## 2020-12-24 DIAGNOSIS — E785 Hyperlipidemia, unspecified: Secondary | ICD-10-CM | POA: Diagnosis not present

## 2020-12-24 DIAGNOSIS — Z1152 Encounter for screening for COVID-19: Secondary | ICD-10-CM | POA: Diagnosis not present

## 2020-12-24 DIAGNOSIS — I10 Essential (primary) hypertension: Secondary | ICD-10-CM | POA: Diagnosis not present

## 2020-12-24 DIAGNOSIS — I482 Chronic atrial fibrillation, unspecified: Secondary | ICD-10-CM

## 2020-12-24 DIAGNOSIS — E118 Type 2 diabetes mellitus with unspecified complications: Secondary | ICD-10-CM | POA: Diagnosis not present

## 2020-12-24 NOTE — Patient Instructions (Signed)
  Lab Work:  Your physician recommends that you return for lab work in:3 [place] If you have labs (blood work) drawn today and your tests are completely normal, you will receive your results only by: [place] (if you have [place]) OR A paper copy in the mail If you have any lab test that is abnormal or we need to change your treatment, we will call you to review the results.  Follow-Up: At [facility], you and your health needs are our priority.  As part of our continuing mission to provide you with exceptional heart care, we have created designated Provider Care Teams.  These Care Teams include your primary Cardiologist (physician) and Advanced Practice Providers (APPs -  Physician Assistants and Nurse Practitioners) who all work together to provide you with the care you need, when you need it.  We recommend signing up for the patient portal called "MyChart".  Sign up information is provided on this After Visit Summary.  MyChart is used to connect with patients for Virtual Visits (Telemedicine).  Patients are able to view lab/test results, encounter notes, upcoming appointments, etc.  Non-urgent messages can be sent to your provider as well.   To learn more about what you can do with MyChart, go to [email or web address].    Your next appointment:   6 month(s)  The format for your next appointment:   In Person  Provider:   Minus Breeding, MD

## 2020-12-25 ENCOUNTER — Ambulatory Visit: Payer: Medicare HMO | Attending: Neurology

## 2020-12-25 DIAGNOSIS — R2689 Other abnormalities of gait and mobility: Secondary | ICD-10-CM | POA: Diagnosis not present

## 2020-12-25 DIAGNOSIS — R2681 Unsteadiness on feet: Secondary | ICD-10-CM | POA: Diagnosis not present

## 2020-12-25 NOTE — Therapy (Signed)
[facility] [address] [place], [place], [address] Phone: [phone]   Fax:  [phone]  Physical Therapy Evaluation  Patient Details  Name: Edward Pittman MRN: [id number] Date of Birth: 25-Jun-1941 Referring Provider (PT): Ruta Hinds NP   Encounter Date: 12/25/2020   PT End of Session - 12/25/20 0853     Visit Number 1    Number of Visits 17    Date for PT Re-Evaluation 02/26/21    Authorization Type [employer]    Progress Note Due on Visit 10    PT Start Time 0845    PT Stop Time 0930    PT Time Calculation (min) 45 min    Equipment Utilized During Treatment Gait belt    Activity Tolerance Patient tolerated treatment well    Behavior During Therapy WFL for tasks assessed/performed             Past Medical History:  Diagnosis Date   Chronic systolic dysfunction of left ventricle    Dyslipidemia    HTN (hypertension)    Hx of adenomatous colonic polyps    Hx of colonoscopy    Keloid    Mild mitral regurgitation by prior echocardiogram    Nonischemic cardiomyopathy ([place])    Persistent atrial fibrillation ([place])    s/p ablation 4/16  Dr. Rayann Heman    Past Surgical History:  Procedure Laterality Date   ATRIAL FIBRILLATION ABLATION N/A 10/11/2011   Procedure: ATRIAL FIBRILLATION ABLATION;  Surgeon: Thompson Grayer, MD;  Location: [facility] CATH LAB;  Service: Cardiovascular;  Laterality: N/A;   COLONOSCOPY     FEMORAL HERNIA REPAIR     at age 24   HERNIA REPAIR     TEE WITHOUT CARDIOVERSION  10/10/2011   Procedure: TRANSESOPHAGEAL ECHOCARDIOGRAM (TEE);  Surgeon: Fay Records, MD;  Location: [facility] ENDOSCOPY;  Service: Cardiovascular;  Laterality: N/A;    There were no vitals filed for this visit.    Subjective Assessment - 12/25/20 0848     Subjective Referred to OPPT following CVA, short hospital stay, denies pain, describes balance issues, and dizziness with head turns, mild in nature and improving, transfer  under S and using RW for mobility    Pertinent History This 80 y.o. male admitted 6/24 with new onset dizziness and Rt sided weakness and facial droop.  Pt received tPA.  CT of head negative for intracranial abnormality, CTA > no LVO.  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 CT of abdomen and pelvis showed infarcts of bil. superior and inferior renal poles. MRI of the brain showed  Small acute infarcts in bilateral cerebellar hemispheres and at the junction of the pons and right middle cerebellar peduncle. Slight edema in the left cerebellum without mass effect.PMH Includes: Chronic systolic dysfunction of Lt ventricle, HTN, keloid, mild mitral regurgitation, nonischemic cardiomyopathy, A-FIb > s/p ablation,    How long can you sit comfortably? no issues    How long can you stand comfortably? >15 min    How long can you walk comfortably? >5 min    Patient Stated Goals wean off of walker and improve mobility.    Currently in Pain? No/denies                [facility] PT Assessment - 12/25/20 0001       Assessment   Medical Diagnosis CVA    Referring Provider (PT) Ruta Hinds NP  Onset Date/Surgical Date 12/19/20    Hand Dominance Right    Prior Therapy CIR      Precautions   Precautions Fall      Balance Screen   Has the patient fallen in the past 6 months No      [place] residence    Living Arrangements Spouse/significant other    Available Help at Discharge Family    Type of [place] to enter    Entrance Stairs-Number of Steps 5    Entrance Stairs-Rails Can reach both    [place] One level      Prior Function   Level of Independence Independent      Sensation   Light Touch Appears Intact      Coordination   Gross Motor Movements are Fluid and Coordinated Yes    Heel Shin Test WFL      ROM / Strength   AROM /  PROM / Strength Strength      Strength   Overall Strength Within functional limits for tasks performed      Transfers   Transfers Sit to Stand;Stand to Sit    Five time sit to stand comments  13.5    Transfer Cueing cues to use UEs    Comments arms crossed      Ambulation/Gait   Ambulation/Gait Yes    Ambulation/Gait Assistance 5: Supervision;4: Min guard    Ambulation Distance (Feet) 115 Feet    Assistive device Rolling walker    Gait Pattern Step-through pattern    Ambulation Surface Level;Indoor    Gait velocity 0.58 m/s    Stairs Yes    Stairs Assistance 5: Supervision;4: Min guard    Number of Stairs 4    Height of Stairs 6      Dynamic Gait Index   Level Surface Mild Impairment    Change in Gait Speed Mild Impairment    Gait with Horizontal Head Turns Mild Impairment    Gait with Vertical Head Turns Mild Impairment    Gait and Pivot Turn Mild Impairment    Step Over Obstacle Mild Impairment    Step Around Obstacles Mild Impairment    Steps Mild Impairment    Total Score 16      High Level Balance   High Level Balance Comments able to maintain all 4 positions on MCTSIB for 30s                        Objective measurements completed on examination: See above findings.               PT Education - 12/25/20 1130     Education Details Patient and spouse educated on Eval findings, rehab potential and POC and are in agreement              PT Short Term Goals - 12/25/20 1136       PT SHORT TERM GOAL #1   Title Patient to demo I in HEP    Baseline See [place] notes    Time 4    Period Weeks    Status New    Target Date 01/29/21      PT SHORT TERM GOAL #2   Title Patient to ambulate 545ft with S and LRAD across outdoor surfaces    Baseline 111ft using RW and S in clinic    Time 4  Period Weeks    Status New    Target Date 01/29/21      PT SHORT TERM GOAL #3   Title Patient to negotiate full flight of steps with single  rail and step through pattern    Baseline 4 steps with single rail and step through pattern    Time 4    Period Weeks    Status New    Target Date 01/29/21      PT SHORT TERM GOAL #4   Title Assess floor transfers    Baseline TBD    Time 4    Period Weeks    Status New    Target Date 01/29/21               PT Long Term Goals - 12/25/20 1148       PT LONG TERM GOAL #1   Title independeant in final HEP    Baseline Access Code: [email or web address]  URL: [email or web address]  Date: 12/25/2020  Prepared by: Sharlynn Oliphant    Exercises  Sit to Stand with Arms Crossed - 2 x daily - 7 x weekly - 1 sets - 5 reps    Time 9    Period Weeks    Status New    Target Date 02/26/21      PT LONG TERM GOAL #2   Title 1058ft ambulation with LRAD across outdoor surfaces    Baseline 157ft in clinic with RW and S    Time 8    Period Weeks    Status New    Target Date 02/26/21      PT LONG TERM GOAL #3   Title Achieve a FOTO score of 80    Baseline 64    Time 8    Period Weeks    Status New    Target Date 02/26/21      PT LONG TERM GOAL #4   Title Increase DGI score to 20    Baseline 16 with RW    Time 8    Period Weeks    Status New    Target Date 02/26/21      PT LONG TERM GOAL #5   Title Increase gait speed to >0.8 m/s with LRAD    Baseline 0.58 m/s with RW    Time 8    Period Weeks    Status New    Target Date 02/26/21                    Plan - 12/25/20 1125     Clinical Impression Statement patient referred to [facility] following short hospital stey related to CVA on 12/18/20 resulting in mild R side weakness.  He lives with spouse who assists as needed, he is S for transfers and ambulation with RW, he presents with good LE strength B, gait velocity deficits noted as well as deficiencies on DGI and gait distance.  Patient is a good candidate for OPPT.    Personal Factors and Comorbidities Comorbidity 1    Comorbidities CVA    Examination-Activity  Limitations Locomotion Level;Transfers    Examination-Participation Restrictions Occupation    Stability/Clinical Decision Making Stable/Uncomplicated    Clinical Decision Making Low    Rehab Potential Good    PT Frequency 2x / week    PT Duration 8 weeks    PT Treatment/Interventions ADLs/Self Care Home Management;Aquatic Therapy;DME Instruction;Gait training;Stair training;Functional mobility training;Therapeutic activities;Therapeutic exercise;Balance training;Neuromuscular re-education    PT Next Visit Plan Establish strength and  balance program, wean off of cane if able    PT Home Exercise Plan [email or web address]    Recommended Other Services OT consult pending    Consulted and Agree with Plan of Care Patient;Family member/caregiver             Patient will benefit from skilled therapeutic intervention in order to improve the following deficits and impairments:  Abnormal gait, Difficulty walking, Decreased endurance, Decreased activity tolerance, Decreased balance, Decreased mobility, Decreased strength  Visit Diagnosis: Other abnormalities of gait and mobility  Balance disorder     Problem List Patient Active Problem List   Diagnosis Date Noted   Embolic stroke ([place]) 83/29/1916   Pain due to onychomycosis of toenails of both feet 11/30/2020   Chronic atrial fibrillation ([place]) 06/01/2019   Stable angina pectoris ([place]) 05/28/2019   Type 2 diabetes mellitus with complication, without long-term current use of insulin ([place]) 11/16/2017   BPH (benign prostatic hyperplasia) 12/12/2013   Routine general medical examination at a health care facility 12/12/2013   Cardiomyopathy, nonischemic ([place]) 08/09/2011   Hyperlipidemia with target LDL less than 100 07/14/2007   MITRAL REGURGITATION, MILD 07/14/2007   Essential hypertension 07/14/2007   Persistent atrial fibrillation ([place]) 07/14/2007   DEGENERATIVE JOINT DISEASE, HIPS 07/14/2007    Lanice Shirts 12/25/2020, 11:57 AM  [facility] [address] [place], [place], [address] Phone: [phone]   Fax:  [phone]  Name: Edward Pittman MRN: [id number] Date of Birth: 07-09-1940

## 2020-12-25 NOTE — Patient Instructions (Signed)
Access Code: [email or web address] URL: [email or web address] Date: 12/25/2020 Prepared by: Sharlynn Oliphant  Exercises Sit to Stand with Arms Crossed - 2 x daily - 7 x weekly - 1 sets - 5 reps

## 2020-12-29 ENCOUNTER — Other Ambulatory Visit: Payer: Self-pay

## 2020-12-29 ENCOUNTER — Encounter: Payer: Self-pay | Admitting: Physical Therapy

## 2020-12-29 ENCOUNTER — Ambulatory Visit: Payer: Medicare HMO | Admitting: Physical Therapy

## 2020-12-29 DIAGNOSIS — R2689 Other abnormalities of gait and mobility: Secondary | ICD-10-CM | POA: Diagnosis not present

## 2020-12-29 DIAGNOSIS — R2681 Unsteadiness on feet: Secondary | ICD-10-CM | POA: Diagnosis not present

## 2020-12-30 NOTE — Therapy (Signed)
[facility] [address] [place], [place], [address] Phone: [phone]   Fax:  [phone]  Physical Therapy Treatment  Patient Details  Name: Edward Pittman MRN: [id number] Date of Birth: March 28, 1941 Referring Provider (PT): Ruta Hinds NP   Encounter Date: 12/29/2020   PT End of Session - 12/29/20 1407     Visit Number 2    Number of Visits 17    Date for PT Re-Evaluation 02/26/21    Authorization Type [employer]    Progress Note Due on Visit 10    PT Start Time 1404    PT Stop Time 1660    PT Time Calculation (min) 39 min    Equipment Utilized During Treatment Gait belt    Activity Tolerance Patient tolerated treatment well    Behavior During Therapy WFL for tasks assessed/performed             Past Medical History:  Diagnosis Date   Chronic systolic dysfunction of left ventricle    Dyslipidemia    HTN (hypertension)    Hx of adenomatous colonic polyps    Hx of colonoscopy    Keloid    Mild mitral regurgitation by prior echocardiogram    Nonischemic cardiomyopathy ([place])    Persistent atrial fibrillation ([place])    s/p ablation 4/16  Dr. Rayann Heman    Past Surgical History:  Procedure Laterality Date   ATRIAL FIBRILLATION ABLATION N/A 10/11/2011   Procedure: ATRIAL FIBRILLATION ABLATION;  Surgeon: Thompson Grayer, MD;  Location: [facility] CATH LAB;  Service: Cardiovascular;  Laterality: N/A;   COLONOSCOPY     FEMORAL HERNIA REPAIR     at age 61   HERNIA REPAIR     TEE WITHOUT CARDIOVERSION  10/10/2011   Procedure: TRANSESOPHAGEAL ECHOCARDIOGRAM (TEE);  Surgeon: Fay Records, MD;  Location: [facility] ENDOSCOPY;  Service: Cardiovascular;  Laterality: N/A;    There were no vitals filed for this visit.   Subjective Assessment - 12/29/20 1406     Subjective No new complaints. No falls or pain to report. Reports HEP is going well with no issues.    Pertinent History This 80 y.o. male admitted 6/24 with new onset dizziness  and Rt sided weakness and facial droop.  Pt received tPA.  CT of head negative for intracranial abnormality, CTA > no LVO.  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 CT of abdomen and pelvis showed infarcts of bil. superior and inferior renal poles. MRI of the brain showed  Small acute infarcts in bilateral cerebellar hemispheres and at the junction of the pons and right middle cerebellar peduncle. Slight edema in the left cerebellum without mass effect.PMH Includes: Chronic systolic dysfunction of Lt ventricle, HTN, keloid, mild mitral regurgitation, nonischemic cardiomyopathy, A-FIb > s/p ablation,    How long can you sit comfortably? no issues    How long can you stand comfortably? >15 min    Patient Stated Goals wean off of walker and improve mobility.    Currently in Pain? No/denies    Pain Score 0-No pain                     [facility] Adult PT Treatment/Exercise - 12/29/20 1410       Transfers   Transfers Sit to Stand;Stand to Sit    Sit to Stand 5: Supervision;With upper extremity assist;From bed;From chair/3-in-1    Stand to Sit 5:  Supervision;With upper extremity assist;To bed;To chair/3-in-1      Ambulation/Gait   Ambulation/Gait Yes    Ambulation/Gait Assistance 4: Min guard    Ambulation/Gait Assistance Details min guard assist for safety with no AD, no loss of balance noted indoors or outdoors. minor veering at times outdoors with no overt loss of balance.    Ambulation Distance (Feet) 230 Feet   x1, ~300 feet in/outdoors combined, plus around clinic with session   Assistive device None    Gait Pattern Step-through pattern;Decreased stride length;Decreased dorsiflexion - right;Decreased dorsiflexion - left;Decreased arm swing - right;Decreased arm swing - left    Ambulation Surface Level;Indoor                 Balance Exercises - 12/29/20 1434       Balance Exercises:  Standing   Rockerboard Anterior/posterior;Lateral;EO;EC;30 seconds;Other reps (comment);Limitations    Rockerboard Limitations performed both ways on balance board with no UE support: rocking the board with  EO, progressing EC with min guard to min assist, cues on posture and weiht shifting; holding the board steady for EC 30 sec's x 3 reps with cues on posture, min guard to min assist for balance.    Tandem Gait Forward;Retro;Intermittent upper extremity support;Foam/compliant surface;4 reps;Limitations    Tandem Gait Limitations on blue foam beam in parallel bars with light touch to bars, cues on step placement and posture.    Sidestepping Foam/compliant support;4 reps;Limitations    Sidestepping Limitations on blue foam beam for 4 laps each way with light to no UE support on bars. cues on posture and to lift feet with stepping, not slide them .    Sit to Stand Standard surface;Without upper extremity support;Foam/compliant surface;Limitations    Sit to Stand Limitations seated with feet across red foam beam, hands on knees: 10 reps with min guard to min assist for balance.                 PT Short Term Goals - 12/25/20 1136       PT SHORT TERM GOAL #1   Title Patient to demo I in HEP    Baseline See [place] notes    Time 4    Period Weeks    Status New    Target Date 01/29/21      PT SHORT TERM GOAL #2   Title Patient to ambulate 574ft with S and LRAD across outdoor surfaces    Baseline 160ft using RW and S in clinic    Time 4    Period Weeks    Status New    Target Date 01/29/21      PT SHORT TERM GOAL #3   Title Patient to negotiate full flight of steps with single rail and step through pattern    Baseline 4 steps with single rail and step through pattern    Time 4    Period Weeks    Status New    Target Date 01/29/21      PT SHORT TERM GOAL #4   Title Assess floor transfers    Baseline TBD    Time 4    Period Weeks    Status New    Target Date 01/29/21                PT Long Term Goals - 12/29/20 1409       PT LONG TERM GOAL #1   Title independeant in final HEP (all STGs due 02/26/21)    Baseline  Access Code: [email or web address]    Time 9    Period Weeks    Status New      PT LONG TERM GOAL #2   Title 1065ft ambulation with LRAD across outdoor surfaces    Baseline 116ft in clinic with RW and S    Time 8    Period Weeks    Status New      PT LONG TERM GOAL #3   Title Achieve a FOTO score of 80    Baseline 64    Time 8    Period Weeks    Status New      PT LONG TERM GOAL #4   Title Increase DGI score to 20    Baseline 16 with RW    Time 8    Period Weeks    Status New      PT LONG TERM GOAL #5   Title Increase gait speed to >0.8 m/s with LRAD    Baseline 0.58 m/s with RW    Time 8    Period Weeks    Status New                   Plan - 12/29/20 1408     Clinical Impression Statement Today's skilled session initally focused on gait with no device on various surfaces. Pt advised no device indoors on his own appears safet at this time. If going outdoors he should have someone with him or use his walker. Pt and spouse verbalized understanding. Remainder of session continued to focus on balance training with no issues noted or reported. The pt is making steady progress toward goals and should benefit from continued PT to progress toward unmet goals.    Personal Factors and Comorbidities Comorbidity 1    Comorbidities CVA    Examination-Activity Limitations Locomotion Level;Transfers    Examination-Participation Restrictions Occupation    Stability/Clinical Decision Making Stable/Uncomplicated    Rehab Potential Good    PT Frequency 2x / week    PT Duration 8 weeks    PT Treatment/Interventions ADLs/Self Care Home Management;Aquatic Therapy;DME Instruction;Gait training;Stair training;Functional mobility training;Therapeutic activities;Therapeutic exercise;Balance training;Neuromuscular re-education    PT Next Visit  Plan continue to work on gait with no device on various surfaces, dynamic gait activities and balance training    PT Home Exercise Plan [email or web address]    Consulted and Agree with Plan of Care Patient;Family member/caregiver             Patient will benefit from skilled therapeutic intervention in order to improve the following deficits and impairments:  Abnormal gait, Difficulty walking, Decreased endurance, Decreased activity tolerance, Decreased balance, Decreased mobility, Decreased strength  Visit Diagnosis: Other abnormalities of gait and mobility  Balance disorder     Problem List Patient Active Problem List   Diagnosis Date Noted   Embolic stroke ([place]) 09/38/1829   Pain due to onychomycosis of toenails of both feet 11/30/2020   Chronic atrial fibrillation ([place]) 06/01/2019   Stable angina pectoris ([place]) 05/28/2019   Type 2 diabetes mellitus with complication, without long-term current use of insulin ([place]) 11/16/2017   BPH (benign prostatic hyperplasia) 12/12/2013   Routine general medical examination at a health care facility 12/12/2013   Cardiomyopathy, nonischemic ([place]) 08/09/2011   Hyperlipidemia with target LDL less than 100 07/14/2007   MITRAL REGURGITATION, MILD 07/14/2007   Essential hypertension 07/14/2007   Persistent atrial fibrillation ([place]) 07/14/2007   DEGENERATIVE JOINT DISEASE, HIPS 07/14/2007    Willow Ora, PTA, CLT Outpatient  [facility] [address] [place], [place] [address] [phone] 12/30/20, 6:06 PM   Name: JAQUANN GUARISCO MRN: [id number] Date of Birth: 1941-03-08

## 2020-12-31 ENCOUNTER — Other Ambulatory Visit: Payer: Self-pay

## 2020-12-31 ENCOUNTER — Ambulatory Visit: Payer: Medicare HMO

## 2020-12-31 ENCOUNTER — Other Ambulatory Visit: Payer: Self-pay | Admitting: Internal Medicine

## 2020-12-31 DIAGNOSIS — R2689 Other abnormalities of gait and mobility: Secondary | ICD-10-CM | POA: Diagnosis not present

## 2020-12-31 DIAGNOSIS — I428 Other cardiomyopathies: Secondary | ICD-10-CM

## 2020-12-31 DIAGNOSIS — E118 Type 2 diabetes mellitus with unspecified complications: Secondary | ICD-10-CM

## 2020-12-31 DIAGNOSIS — R2681 Unsteadiness on feet: Secondary | ICD-10-CM

## 2020-12-31 NOTE — Therapy (Signed)
[facility] [address] [place], [place], [address] Phone: [phone]   Fax:  [phone]  Physical Therapy Treatment  Patient Details  Name: Edward Pittman MRN: [id number] Date of Birth: [birth year] Referring Provider (PT): Ruta Hinds NP   Encounter Date: 12/31/2020    Past Medical History:  Diagnosis Date   Chronic systolic dysfunction of left ventricle    Dyslipidemia    HTN (hypertension)    Hx of adenomatous colonic polyps    Hx of colonoscopy    Keloid    Mild mitral regurgitation by prior echocardiogram    Nonischemic cardiomyopathy ([place])    Persistent atrial fibrillation [facility])    s/p ablation 4/16  Dr. Rayann Heman    Past Surgical History:  Procedure Laterality Date   ATRIAL FIBRILLATION ABLATION N/A 10/11/2011   Procedure: ATRIAL FIBRILLATION ABLATION;  Surgeon: Thompson Grayer, MD;  Location: [facility] CATH LAB;  Service: Cardiovascular;  Laterality: N/A;   COLONOSCOPY     FEMORAL HERNIA REPAIR     at age 70   HERNIA REPAIR     TEE WITHOUT CARDIOVERSION  10/10/2011   Procedure: TRANSESOPHAGEAL ECHOCARDIOGRAM (TEE);  Surgeon: Fay Records, MD;  Location: [facility] ENDOSCOPY;  Service: Cardiovascular;  Laterality: N/A;    There were no vitals filed for this visit.   Subjective Assessment - 12/31/20 1749     Subjective No falls or med changes, ambulating in home w/o need of AD    Patient is accompained by: Family member    How long can you sit comfortably? no issues    How long can you stand comfortably? >15 min    How long can you walk comfortably? >5 min    Patient Stated Goals wean off of walker and improve mobility.                               [facility] Adult PT Treatment/Exercise - 12/31/20 0001       Knee/Hip Exercises: Aerobic   Other Aerobic Scifit L1 8' arms 8                 Balance Exercises - 12/31/20 0001       Balance Exercises: Standing   Stepping Strategy  Anterior;10 reps;Limitations    Stepping Strategy Limitations tapping floor targets starting with 2 unilateral, 2 alt, 2 cross body then single target cross body    Step Ups Lateral;2 inch;UE support 1;Limitations    Step Ups Limitations in // bars stpe up and over from airex pad.    Tandem Gait Forward;Retro;Upper extremity support;Intermittent upper extremity support;5 reps;Limitations    Tandem Gait Limitations diminishing UE support    Sidestepping 5 reps;Limitations    Sidestepping Limitations No UE support    Marching Upper extremity assist 1;Dynamic;Forwards;10 reps;Limitations    Marching Limitations runners step from airex                 PT Short Term Goals - 12/25/20 1136       PT SHORT TERM GOAL #1   Title Patient to demo I in HEP    Baseline See [place] notes    Time 4    Period Weeks    Status New    Target Date 01/29/21      PT SHORT TERM GOAL #2   Title Patient to ambulate 543ft with S and LRAD across outdoor surfaces    Baseline 141ft using  RW and S in clinic    Time 4    Period Weeks    Status New    Target Date 01/29/21      PT SHORT TERM GOAL #3   Title Patient to negotiate full flight of steps with single rail and step through pattern    Baseline 4 steps with single rail and step through pattern    Time 4    Period Weeks    Status New    Target Date 01/29/21      PT SHORT TERM GOAL #4   Title Assess floor transfers    Baseline TBD    Time 4    Period Weeks    Status New    Target Date 01/29/21               PT Long Term Goals - 12/31/20 1757       PT LONG TERM GOAL #1   Title independeant in final HEP (all STGs due 02/26/21)    Baseline Access Code: [email or web address]    Time 9    Period Weeks    Status New      PT LONG TERM GOAL #2   Title 1060ft ambulation with LRAD across outdoor surfaces    Baseline 131ft in clinic with RW and S    Time 8    Period Weeks    Status New      PT LONG TERM GOAL #3   Title Achieve a FOTO score  of 80    Baseline 64    Time 8    Period Weeks    Status New      PT LONG TERM GOAL #4   Title Increase DGI score to 20    Baseline 16 with RW    Time 8    Period Weeks    Status New      PT LONG TERM GOAL #5   Title Increase gait speed to >0.8 m/s with LRAD    Baseline 0.58 m/s with RW    Time 8    Period Weeks    Status New                   Plan - 12/31/20 1827     Clinical Impression Statement Todays session focused on balance tasks, dynamic balance challenges, SLS tasks and tapping floor targets with cog challenges added sucha as alternating and cross body motions.  Tasks performed wit diminishing UE support w/o LOB noted.  Some VCs needed to remain on tasks with alternating movements    Personal Factors and Comorbidities Comorbidity 1    Comorbidities CVA    Examination-Activity Limitations Locomotion Level;Transfers    Examination-Participation Restrictions Occupation    Stability/Clinical Decision Making Stable/Uncomplicated    Rehab Potential Good    PT Frequency 2x / week    PT Duration 8 weeks    PT Treatment/Interventions ADLs/Self Care Home Management;Aquatic Therapy;DME Instruction;Gait training;Stair training;Functional mobility training;Therapeutic activities;Therapeutic exercise;Balance training;Neuromuscular re-education    PT Next Visit Plan continue to work on gait with no device on various surfaces, dynamic gait activities and balance training, patient to bring cane next session, incorporate cog challenges    PT Home Exercise Plan [email or web address]    Consulted and Agree with Plan of Care Patient;Family member/caregiver             Patient will benefit from skilled therapeutic intervention in order to improve the following deficits and impairments:  Abnormal gait, Difficulty walking,  Decreased endurance, Decreased activity tolerance, Decreased balance, Decreased mobility, Decreased strength  Visit Diagnosis: Unsteadiness on feet  Other  abnormalities of gait and mobility  Balance disorder     Problem List Patient Active Problem List   Diagnosis Date Noted   Embolic stroke ([place]) 16/94/5038   Pain due to onychomycosis of toenails of both feet 11/30/2020   Chronic atrial fibrillation ([place]) 06/01/2019   Stable angina pectoris ([place]) 05/28/2019   Type 2 diabetes mellitus with complication, without long-term current use of insulin ([place]) 11/16/2017   BPH (benign prostatic hyperplasia) 12/12/2013   Routine general medical examination at a health care facility 12/12/2013   Cardiomyopathy, nonischemic ([place]) 08/09/2011   Hyperlipidemia with target LDL less than 100 07/14/2007   MITRAL REGURGITATION, MILD 07/14/2007   Essential hypertension 07/14/2007   Persistent atrial fibrillation ([place]) 07/14/2007   DEGENERATIVE JOINT DISEASE, HIPS 07/14/2007    Lanice Shirts PT 12/31/2020, 6:32 PM  [facility] [address] [place], [place], [address] Phone: [phone]   Fax:  [phone]  Name: Edward Pittman MRN: [id number] Date of Birth: [birth year]

## 2021-01-04 DIAGNOSIS — Z1152 Encounter for screening for COVID-19: Secondary | ICD-10-CM | POA: Diagnosis not present

## 2021-01-05 ENCOUNTER — Ambulatory Visit: Payer: Medicare HMO

## 2021-01-05 ENCOUNTER — Other Ambulatory Visit: Payer: Self-pay

## 2021-01-05 DIAGNOSIS — R2681 Unsteadiness on feet: Secondary | ICD-10-CM

## 2021-01-05 DIAGNOSIS — R2689 Other abnormalities of gait and mobility: Secondary | ICD-10-CM

## 2021-01-05 NOTE — Therapy (Signed)
[facility] [address] [place], [place], [address] Phone: [phone]   Fax:  [phone]  Physical Therapy Treatment  Patient Details  Name: Edward Pittman MRN: [id number] Date of Birth: Jan 04, 1941 Referring Provider (PT): Ruta Hinds NP   Encounter Date: 01/05/2021    Past Medical History:  Diagnosis Date   Chronic systolic dysfunction of left ventricle    Dyslipidemia    HTN (hypertension)    Hx of adenomatous colonic polyps    Hx of colonoscopy    Keloid    Mild mitral regurgitation by prior echocardiogram    Nonischemic cardiomyopathy ([place])    Persistent atrial fibrillation [facility])    s/p ablation 4/16  Dr. Rayann Heman    Past Surgical History:  Procedure Laterality Date   ATRIAL FIBRILLATION ABLATION N/A 10/11/2011   Procedure: ATRIAL FIBRILLATION ABLATION;  Surgeon: Thompson Grayer, MD;  Location: [facility] CATH LAB;  Service: Cardiovascular;  Laterality: N/A;   COLONOSCOPY     FEMORAL HERNIA REPAIR     at age 38   HERNIA REPAIR     TEE WITHOUT CARDIOVERSION  10/10/2011   Procedure: TRANSESOPHAGEAL ECHOCARDIOGRAM (TEE);  Surgeon: Fay Records, MD;  Location: [facility] ENDOSCOPY;  Service: Cardiovascular;  Laterality: N/A;    There were no vitals filed for this visit.   Subjective Assessment - 01/05/21 1237     Subjective No falls, has a borrowed cane to use from a neighbor    Patient is accompained by: Family member    Pertinent History This 80 y.o. male admitted 6/24 with new onset dizziness and Rt sided weakness and facial droop.  Pt received tPA.  CT of head negative for intracranial abnormality, CTA > no LVO.  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 CT of abdomen and pelvis showed infarcts of bil. superior and inferior renal poles. MRI of the brain showed  Small acute infarcts in bilateral cerebellar hemispheres and at the  junction of the pons and right middle cerebellar peduncle. Slight edema in the left cerebellum without mass effect.PMH Includes: Chronic systolic dysfunction of Lt ventricle, HTN, keloid, mild mitral regurgitation, nonischemic cardiomyopathy, A-FIb > s/p ablation,    How long can you sit comfortably? no issues    How long can you stand comfortably? >15 min    How long can you walk comfortably? >5 min    Patient Stated Goals wean off of walker and improve mobility.                               [place] Adult PT Treatment/Exercise - 01/05/21 0001       Transfers   Transfers Sit to Stand    Sit to Stand 5: Supervision    Stand to Sit 5: Supervision;4: Min guard      Ambulation/Gait   Ambulation/Gait Yes    Ambulation/Gait Assistance 5: Supervision    Ambulation Distance (Feet) 230 Feet    Assistive device Straight cane    Gait Pattern Step-through pattern;Decreased stride length;Decreased dorsiflexion - right;Decreased dorsiflexion - left;Decreased arm swing - right;Decreased arm swing - left    Ambulation Surface Level;Indoor    Gait Comments good cadence      Lumbar Exercises: Seated   Other Seated Lumbar Exercises latissimus press      Knee/Hip Exercises: Aerobic   Other Aerobic Scifit L1.5 8' arms  8      Knee/Hip Exercises: Seated   Long Arc Quad Both;1 set;15 reps;Weights;Limitations    Long Arc Quad Weight 2 lbs.    Long [employer] Limitations performed with latissimus press    Heel Slides Strengthening;Both;1 set;15 reps;Limitations    Heel Slides Limitations towel slide    Marching Strengthening;Both;1 set;15 reps;Weights;Limitations    Marching Limitations performed with latissimus press    Marching Weights 2 lbs.                 Balance Exercises - 01/05/21 0001       Balance Exercises: Standing   Tandem Stance Eyes open;Eyes closed;30 secs;5 reps;Limitations    Tandem Stance Time 30s static satnd followed by 5 head turns/nods in ea.  position                 PT Short Term Goals - 12/25/20 1136       PT SHORT TERM GOAL #1   Title Patient to demo I in HEP    Baseline See [place] notes    Time 4    Period Weeks    Status New    Target Date 01/29/21      PT SHORT TERM GOAL #2   Title Patient to ambulate 564ft with S and LRAD across outdoor surfaces    Baseline 157ft using RW and S in clinic    Time 4    Period Weeks    Status New    Target Date 01/29/21      PT SHORT TERM GOAL #3   Title Patient to negotiate full flight of steps with single rail and step through pattern    Baseline 4 steps with single rail and step through pattern    Time 4    Period Weeks    Status New    Target Date 01/29/21      PT SHORT TERM GOAL #4   Title Assess floor transfers    Baseline TBD    Time 4    Period Weeks    Status New    Target Date 01/29/21               PT Long Term Goals - 12/31/20 1757       PT LONG TERM GOAL #1   Title independeant in final HEP (all STGs due 02/26/21)    Baseline Access Code: [email or web address]    Time 9    Period Weeks    Status New      PT LONG TERM GOAL #2   Title 1060ft ambulation with LRAD across outdoor surfaces    Baseline 135ft in clinic with RW and S    Time 8    Period Weeks    Status New      PT LONG TERM GOAL #3   Title Achieve a FOTO score of 80    Baseline 64    Time 8    Period Weeks    Status New      PT LONG TERM GOAL #4   Title Increase DGI score to 20    Baseline 16 with RW    Time 8    Period Weeks    Status New      PT LONG TERM GOAL #5   Title Increase gait speed to >0.8 m/s with LRAD    Baseline 0.58 m/s with RW    Time 8    Period Weeks    Status New  Plan - 01/05/21 1258     Clinical Impression Statement todays session focused on gait training with cane, adjusting for proper height.  Began core exercises for seated balance tarining and functional strengthening.  Several episodes of redirecting patient to  remain on tasks required.  Performed static balance tasks in tandem standing with EO/EC from solid sueface, including head motions w/o LOB noted.    Personal Factors and Comorbidities Comorbidity 1    Comorbidities CVA    Examination-Activity Limitations Locomotion Level;Transfers    Examination-Participation Restrictions Occupation    Stability/Clinical Decision Making Stable/Uncomplicated    Rehab Potential Good    PT Frequency 2x / week    PT Duration 8 weeks    PT Treatment/Interventions ADLs/Self Care Home Management;Aquatic Therapy;DME Instruction;Gait training;Stair training;Functional mobility training;Therapeutic activities;Therapeutic exercise;Balance training;Neuromuscular re-education    PT Next Visit Plan stairs with cane, challenge balance on foam surface, standing stepping tasks, floor transfers    PT Home Exercise Plan [email or web address]    Consulted and Agree with Plan of Care Patient;Family member/caregiver             Patient will benefit from skilled therapeutic intervention in order to improve the following deficits and impairments:  Abnormal gait, Difficulty walking, Decreased endurance, Decreased activity tolerance, Decreased balance, Decreased mobility, Decreased strength  Visit Diagnosis: Unsteadiness on feet  Other abnormalities of gait and mobility  Balance disorder     Problem List Patient Active Problem List   Diagnosis Date Noted   Embolic stroke ([place]) 80/32/1224   Pain due to onychomycosis of toenails of both feet 11/30/2020   Chronic atrial fibrillation ([place]) 06/01/2019   Stable angina pectoris ([place]) 05/28/2019   Type 2 diabetes mellitus with complication, without long-term current use of insulin ([place]) 11/16/2017   BPH (benign prostatic hyperplasia) 12/12/2013   Routine general medical examination at a health care facility 12/12/2013   Cardiomyopathy, nonischemic ([place]) 08/09/2011   Hyperlipidemia with target LDL less than 100 07/14/2007   MITRAL  REGURGITATION, MILD 07/14/2007   Essential hypertension 07/14/2007   Persistent atrial fibrillation ([place]) 07/14/2007   DEGENERATIVE JOINT DISEASE, HIPS 07/14/2007    Lanice Shirts PT 01/05/2021, 2:10 PM  [place] [facility] [address] [place], [place], [address] Phone: [phone]   Fax:  [phone]  Name: Edward Pittman MRN: [id number] Date of Birth: 1940/08/10

## 2021-01-07 ENCOUNTER — Ambulatory Visit: Payer: Medicare HMO | Admitting: Physical Therapy

## 2021-01-07 ENCOUNTER — Encounter: Payer: Self-pay | Admitting: Physical Therapy

## 2021-01-07 ENCOUNTER — Other Ambulatory Visit: Payer: Self-pay

## 2021-01-07 DIAGNOSIS — R2689 Other abnormalities of gait and mobility: Secondary | ICD-10-CM | POA: Diagnosis not present

## 2021-01-07 DIAGNOSIS — R2681 Unsteadiness on feet: Secondary | ICD-10-CM

## 2021-01-07 DIAGNOSIS — Z1152 Encounter for screening for COVID-19: Secondary | ICD-10-CM | POA: Diagnosis not present

## 2021-01-07 NOTE — Patient Outreach (Signed)
[place] [facility]) Care Management  01/07/2021  Catlin Aycock Letts February 02, 1941 [id number]   EMMI- Stroke RED ON EMMI ALERT Day # 13 Date: 01/05/21 Red Alert Reason:  Had follow-up appointment?   No  Scheduled a follow-up appointment?   No    Outreach attempt: Male answered stating patient cannot come to the phone presently and asked CM to call back.  Advised that CM would attempt again later.  She verbalized understanding.     Plan: RN CM will attempt again later today.    Jone Baseman, RN, MSN [facility] Care Management Care Management Coordinator Direct Line [phone] Toll Free: [phone]  Fax: [phone]

## 2021-01-07 NOTE — Patient Outreach (Signed)
[place] [facility]) Care Management  01/07/2021  Edward Pittman Nov 07, 1940 [id number]   EMMI- Stroke RED ON EMMI ALERT Day # 13 Date: 01/05/21 Red Alert Reason:  Had follow-up appointment?   No  Scheduled a follow-up appointment?   No      Outreach attempt: Call back as requested. No answer.  Unable to leave a voice message.    Plan: RN CM will attempt within 4 business days and send letter.    Jone Baseman, RN, MSN [place] [facility] Care Management Coordinator Direct Line [phone] Cell [phone] Toll Free: [phone]  Fax: [phone]

## 2021-01-08 NOTE — Therapy (Addendum)
[facility] [address] [place], [place], [address] Phone: [phone]   Fax:  [phone]  Physical Therapy Treatment  Patient Details  Name: Edward Pittman MRN: [id number] Date of Birth: 1941/06/12 Referring Provider (PT): Ruta Hinds NP   Encounter Date: 01/07/2021   PT End of Session - 01/07/21 1538     Visit Number 5    Number of Visits 17    Date for PT Re-Evaluation 02/26/21    Authorization Type [employer]    Authorization Time Period 16 visits from 12/25/20 through 03/04/21    Authorization - Number of Visits 16    Progress Note Due on Visit 10    PT Start Time 9509    PT Stop Time 3267    PT Time Calculation (min) 40 min    Equipment Utilized During Treatment Gait belt    Activity Tolerance Patient tolerated treatment well    Behavior During Therapy WFL for tasks assessed/performed             Past Medical History:  Diagnosis Date   Chronic systolic dysfunction of left ventricle    Dyslipidemia    HTN (hypertension)    Hx of adenomatous colonic polyps    Hx of colonoscopy    Keloid    Mild mitral regurgitation by prior echocardiogram    Nonischemic cardiomyopathy ([place])    Persistent atrial fibrillation ([place])    s/p ablation 4/16  Dr. Rayann Heman    Past Surgical History:  Procedure Laterality Date   ATRIAL FIBRILLATION ABLATION N/A 10/11/2011   Procedure: ATRIAL FIBRILLATION ABLATION;  Surgeon: Thompson Grayer, MD;  Location: [facility] CATH LAB;  Service: Cardiovascular;  Laterality: N/A;   COLONOSCOPY     FEMORAL HERNIA REPAIR     at age 66   HERNIA REPAIR     TEE WITHOUT CARDIOVERSION  10/10/2011   Procedure: TRANSESOPHAGEAL ECHOCARDIOGRAM (TEE);  Surgeon: Fay Records, MD;  Location: [facility] ENDOSCOPY;  Service: Cardiovascular;  Laterality: N/A;    There were no vitals filed for this visit.   Subjective Assessment - 01/07/21 1537     Subjective No new complaints. No falls or pain to report. Has cane  with him on arrival to session, carrying it vs using it.    Patient is accompained by: Family member    Pertinent History This 80 y.o. male admitted 6/24 with new onset dizziness and Rt sided weakness and facial droop.  Pt received tPA.  CT of head negative for intracranial abnormality, CTA > no LVO.  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 CT of abdomen and pelvis showed infarcts of bil. superior and inferior renal poles. MRI of the brain showed  Small acute infarcts in bilateral cerebellar hemispheres and at the junction of the pons and right middle cerebellar peduncle. Slight edema in the left cerebellum without mass effect.PMH Includes: Chronic systolic dysfunction of Lt ventricle, HTN, keloid, mild mitral regurgitation, nonischemic cardiomyopathy, A-FIb > s/p ablation,    How long can you sit comfortably? no issues    How long can you stand comfortably? >15 min    How long can you walk comfortably? >5 min    Patient Stated Goals wean off of walker and improve mobility.    Currently in Pain? No/denies    Pain Score 0-No pain  [place] Adult PT Treatment/Exercise - 01/07/21 1539       Transfers   Transfers Sit to Stand;Stand to Sit    Sit to Stand 6: Modified independent (Device/Increase time);With upper extremity assist;Without upper extremity assist;From bed;From chair/3-in-1    Stand to Sit 6: Modified independent (Device/Increase time);With upper extremity assist;Without upper extremity assist;To bed;To chair/3-in-1      Ambulation/Gait   Ambulation/Gait Yes    Ambulation/Gait Assistance 5: Supervision    Ambulation/Gait Assistance Details gait outdoors with pt intermittently using cane, no balance issues noted.    Ambulation Distance (Feet) 1000 Feet   x1, plus around clinic with session   Assistive device Straight cane    Gait Pattern Step-through pattern;Decreased  stride length;Decreased dorsiflexion - right;Decreased dorsiflexion - left;Decreased arm swing - right;Decreased arm swing - left    Ambulation Surface Level;Unlevel;Indoor;Outdoor;Paved;Gravel;Grass                 Balance Exercises - 01/07/21 1551       Balance Exercises: Standing   SLS with Vectors Foam/compliant surface;Intermittent upper extremity assist;Other reps (comment);Limitations    SLS with Vectors Limitations on balance board in anterior/posterior direction with 2 tall cones on floor in front: alternating foot taps forward, cross, double forward, double cross for ~10 reps each with intermittent touch to bars for balance. Cues on posture and weight shifting with min guard to min assist for balance.    Rockerboard Anterior/posterior;EO;10 seconds;30 seconds;Other reps (comment);Limitations    Rockerboard Limitations in anterior/posterior direction only: rocking the board with emphasis on tall posture with EO, progressing to [facility] for 15 reps, then holding the board steady for EC 30 sec's x 3 reps, progressing to EC head movements left<>right, up<>down and diagonal both ways for ~10 reps each. Min guard to min assist for balance.    Tandem Gait Forward;Retro;Foam/compliant surface. On  blue foam beam for 4 reps each way with light UE support on bars. Cues for step placement and weight shifting. Min guard assist for balance   Sidestepping Foam/compliant support. On blue foam beam for 4 laps toward each side with cues on posture and step length/height with occasional touch to bars. Min guard assist for balance.    Sit to Stand Standard surface;Without upper extremity support;Foam/compliant surface;Limitations    Sit to Stand Limitations seated with feet on balance board in anterior/posterior direction. Cues for full upright standing and controlled descent.                 PT Short Term Goals - 12/25/20 1136       PT SHORT TERM GOAL #1   Title Patient to demo I in HEP     Baseline See [name] notes    Time 4    Period Weeks    Status New    Target Date 01/29/21      PT SHORT TERM GOAL #2   Title Patient to ambulate 539ft with S and LRAD across outdoor surfaces    Baseline 145ft using RW and S in clinic    Time 4    Period Weeks    Status New    Target Date 01/29/21      PT SHORT TERM GOAL #3   Title Patient to negotiate full flight of steps with single rail and step through pattern    Baseline 4 steps with single rail and step through pattern    Time 4    Period Weeks    Status New  Target Date 01/29/21      PT SHORT TERM GOAL #4   Title Assess floor transfers    Baseline TBD    Time 4    Period Weeks    Status New    Target Date 01/29/21               PT Long Term Goals - 12/31/20 1757       PT LONG TERM GOAL #1   Title independeant in final HEP (all STGs due 02/26/21)    Baseline Access Code: [email or web address]    Time 9    Period Weeks    Status New      PT LONG TERM GOAL #2   Title 1036ft ambulation with LRAD across outdoor surfaces    Baseline 125ft in clinic with RW and S    Time 8    Period Weeks    Status New      PT LONG TERM GOAL #3   Title Achieve a FOTO score of 80    Baseline 64    Time 8    Period Weeks    Status New      PT LONG TERM GOAL #4   Title Increase DGI score to 20    Baseline 16 with RW    Time 8    Period Weeks    Status New      PT LONG TERM GOAL #5   Title Increase gait speed to >0.8 m/s with LRAD    Baseline 0.58 m/s with RW    Time 8    Period Weeks    Status New               01/07/21 1538  Plan  Clinical Impression Statement Today's skilled session continued to focus on gait on various surfaces with no device and balance training on compliatn surfaces with and without vision removed. The pt continues to be challenged with complaint surfaces for balance training, no issues noted with gait outdoors. The pt is making progress toward goals and should benefit from continued PT to  progress toward unmet goals.  Personal Factors and Comorbidities Comorbidity 1  Comorbidities CVA  Examination-Activity Limitations Locomotion Level;Transfers  Examination-Participation Restrictions Occupation  Pt will benefit from skilled therapeutic intervention in order to improve on the following deficits Abnormal gait;Difficulty walking;Decreased endurance;Decreased activity tolerance;Decreased balance;Decreased mobility;Decreased strength  Stability/Clinical Decision Making Stable/Uncomplicated  Rehab Potential Good  PT Frequency 2x / week  PT Duration 8 weeks  PT Treatment/Interventions ADLs/Self Care Home Management;Aquatic Therapy;DME Instruction;Gait training;Stair training;Functional mobility training;Therapeutic activities;Therapeutic exercise;Balance training;Neuromuscular re-education  PT Next Visit Plan stairs with cane, challenge balance on foam surface, standing stepping tasks, floor transfers  PT Home Exercise Plan [email or web address]  Consulted and Agree with Plan of Care Patient;Family member/caregiver          Patient will benefit from skilled therapeutic intervention in order to improve the following deficits and impairments:  Abnormal gait, Difficulty walking, Decreased endurance, Decreased activity tolerance, Decreased balance, Decreased mobility, Decreased strength  Visit Diagnosis: Unsteadiness on feet  Other abnormalities of gait and mobility     Problem List Patient Active Problem List   Diagnosis Date Noted   Embolic stroke ([place]) 40/98/1191   Pain due to onychomycosis of toenails of both feet 11/30/2020   Chronic atrial fibrillation ([place]) 06/01/2019   Stable angina pectoris ([place]) 05/28/2019   Type 2 diabetes mellitus with complication, without long-term current use of insulin ([place]) 11/16/2017   BPH (benign prostatic  hyperplasia) 12/12/2013   Routine general medical examination at a health care facility 12/12/2013   Cardiomyopathy, nonischemic ([place])  08/09/2011   Hyperlipidemia with target LDL less than 100 07/14/2007   MITRAL REGURGITATION, MILD 07/14/2007   Essential hypertension 07/14/2007   Persistent atrial fibrillation ([place]) 07/14/2007   DEGENERATIVE JOINT DISEASE, HIPS 07/14/2007   Willow Ora, PTA, [facility] [address], [place], [place] [address] [phone] 01/08/21, 7:31 PM   Name: NAVEED HUMPHRES MRN: [id number] Date of Birth: Sep 13, 1940

## 2021-01-11 ENCOUNTER — Other Ambulatory Visit: Payer: Self-pay

## 2021-01-11 NOTE — Patient Instructions (Signed)
Goals Addressed               This Visit's Progress     Keep or Improve My Strength-Stroke (pt-stated)        Timeframe:  Short-Term Goal Priority:  High Start Date:     01/11/21                        Expected End Date:   06/26/21                    Follow Up Date 02/24/21   - attend 90 percent of physical therapy appointments - eat healthy to increase strength - know who to call for help if I fall    Why is this important?   Before the stroke you probably did not think much about being safe when you are up and about.  Now, it may be harder for you to get around.  It may also be easier for you to trip or fall.  It is common to have muscle weakness after a stroke. You may also feel like you cannot control an arm or leg.  It will be helpful to work with a physical therapist to get your strength and muscle control back.  It is good to stay as active as you can. Walking and stretching help you stay strong and flexible.  The physical therapist will develop an exercise program just for you.     Notes: 01/11/21 Patient in outpatient therapy. Patient reports making good progress. Using cane for ambulation but can do most aciivites without cane.        Track and Manage Heart Rate and Rhythm-Atrial Fibrillation        Barriers: Knowledge Health Behaviors Timeframe:  Long-Range Goal Priority:  High Start Date:    01/11/21                         Expected End Date:  06/26/21                     Follow Up Date 02/24/21    - check pulse (heart) rate once a day - make a plan to eat healthy - take medicine as prescribed    Why is this important?   Atrial fibrillation may have no symptoms. Sometimes the symptoms get worse or happen more often.  It is important to keep track of what your symptoms are and when they happen.  A change in symptoms is important to discuss with your doctor or nurse.  Being active and healthy eating will also help you manage your heart condition.     Notes:  01/11/21 Patient with persistent Atrial Fibrillation.  On xarelto.  Discussed medication adherence.  Atrial Fibrillation Management Discussed Take medication as prescribed See physicians as scheduled Monitor for chest pain, constant flutters in chest, shortness of breath, weakness, and feeling of passing out.   Monitor heart rate

## 2021-01-11 NOTE — Patient Outreach (Signed)
[facility]) Care Management  [place]  01/11/2021   Edward Pittman 11-18-1940 [id number]  EMMI- Stroke RED ON EMMI ALERT Day # 13 Date: 01/05/21 Red Alert Reason:  Had follow-up appointment?   No  Scheduled a follow-up appointment?   No   Spoke with patient and addressed red alert.  Patient has had follow up with cardiology as scheduled and has appointment with neurology this week.    Subjective: Patient with recent hospitalization for stroke.  Also has medical history of A. Fib, HTN and HLD.  Patient reports he is a lot better from his stroke and reports he feels normal.  He did have some issues with weakness and balance, which has gone away. Patient is participating with outpatient therapy and states he is doing very well and only expects therapy a couple of more weeks. He reports using a cane at times but most ambulates independently. He lives in the home with his spouse and is independent with all care aspects.    Discussed [facility] education and support.  He is agreeable to RN CM outreach at this time. Patient is also involved with embedded pharmacist. Will let her know of my involvement and recent stroke.   Objective:   Encounter Medications:  Outpatient Encounter Medications as of 01/11/2021  Medication Sig   atorvastatin (LIPITOR) 40 MG tablet Take 1 tablet (40 mg total) by mouth daily.   B Complex-Biotin-FA (B COMPLETE) TABS Take 1 tablet by mouth daily with breakfast.   diltiazem (CARDIZEM CD) 240 MG 24 hr capsule Take 1 capsule (240 mg total) by mouth daily.   FARXIGA 10 MG TABS tablet TAKE 1 TABLET(10 MG) BY MOUTH DAILY BEFORE BREAKFAST   metoprolol tartrate (LOPRESSOR) 100 MG tablet Take 1 tablet (100 mg total) by mouth 2 (two) times daily. NEED OV. (Patient taking differently: Take 100 mg by mouth 2 (two) times daily.)   rivaroxaban (XARELTO) 20 MG TABS tablet TAKE 1 TABLET(20 MG) BY MOUTH DAILY WITH SUPPER (Patient taking differently: Take 20 mg  by mouth daily with supper.)   No facility-administered encounter medications on file as of 01/11/2021.    Functional Status:  In your present state of health, do you have any difficulty performing the following activities: 01/11/2021  Hearing? N  Vision? N  Difficulty concentrating or making decisions? N  Walking or climbing stairs? N  Dressing or bathing? N  Doing errands, shopping? N  Preparing Food and eating ? N  Using the Toilet? N  In the past six months, have you accidently leaked urine? N  Do you have problems with loss of bowel control? N  Managing your Medications? N  Managing your Finances? N  Housekeeping or managing your Housekeeping? N  Some recent data might be hidden    Fall/Depression Screening: Fall Risk  01/11/2021 12/09/2019 05/28/2019  Falls in the past year? 0 0 0  Number falls in past yr: - 0 0  Injury with Fall? - 0 0  Risk for fall due to : - No Fall Risks -  Follow up - Falls evaluation completed Falls evaluation completed   PHQ 2/9 Scores 01/11/2021 12/09/2019 05/28/2019 11/23/2018 11/16/2017 05/15/2016 12/13/2013  PHQ - 2 Score 0 0 0 0 0 0 0    Assessment:   Care Plan Care Plan : Atrial Fibrillation (Adult)  Updates made by Jon Billings, RN since 01/11/2021 12:00 AM     Problem: Stroke Risk (Atrial Fibrillation)      Long-Range Goal:  Heart Rate and Rhythm Monitored and Managed as evidenced by patient reporting heart rate   Start Date: 01/11/2021  Expected End Date: 06/26/2021  Priority: High  Note:   Evidence-based guidance:  Assess heart rate, rhythm and presence of symptoms at each encounter using pulse palpation, blood pressure monitor and review of symptom diary.  Consider pulse check plus an electrocardiogram (ECG) in women over the age of 65 years.  Prepare patient for diagnostic studies, such as 24-hour ambulatory monitoring, echocardiogram or implantable loop recorder, based on presentation and risk factors, such as cryptogenic stroke.   Prepare patient for use of pharmacologic therapy, such as beta-blocker, calcium channel blocker, digoxin, antiarrhythmic, based on presentation, risk factors and patient preferences.  Monitor side effects and expect periodic adjustments.  Provide an opportunity for shared decision-making when uncontrolled rate and rhythm persist and invasive therapy is considered, such as left atrial ablation, pacemaker, cardioversion or cardiac resynchronization therapy.  Provide reassurance, as initial symptoms and potential recurrence are frightening to patient and family/caregiver and may impact quality of life.  Provide prompt follow-up after hospitalization to support transition of care; consider referral to home care or community support program, especially in presence of comorbidity.  Encourage exercise 2 to 3 times per week, based on ability and tolerance, to improve physical and cardiac function and quality of life.   Notes:     Task: Alleviate Barriers to Dysrhythmia Management   Due Date: 06/26/2021  Priority: Routine  Responsible User: Jon Billings, RN  Note:   Care Management Activities:    - medication-adherence assessment completed - reassurance provided - rescue (action) plan developed    Notes: 01/11/21 Patient with persistent Atrial Fibrillation.  On xarelto.  Discussed medication adherence.  Atrial Fibrillation Management Discussed Take medication as prescribed See physicians as scheduled Monitor for chest pain, constant flutters in chest, shortness of breath, weakness, and feeling of passing out.   Monitor heart rate    Care Plan : Stroke (Adult)  Updates made by Jon Billings, RN since 01/11/2021 12:00 AM     Problem: Recurrence (Stroke)      Goal: Stroke Recurrence Prevented or Minimized as evidenced by no readmission related to stroke.   Start Date: 01/11/2021  Expected End Date: 02/24/2021  Note:   Evidence-based guidance:  Establish baseline by comparing pre and  poststroke level of function using age-appropriate criteria; consider history of transient ischemia attack.  Assess for sudden or new changes in attention, vision changes, language pattern, motor control and mobility, as well as facial drooping, unilateral weakness, confusion and fatigue that may indicate recurrence.  Promote long-term management of risk factors, such as tobacco use, sedentary lifestyle, obesity, sleep disordered breathing, unhealthy diet and high alcohol consumption.  Prepare patient for cardiac monitoring (invasive or noninvasive) when cause of stroke is unknown; consider ankle-brachial index measurement as a means of predicting recurrence risk.  Monitor and manage comorbidity, such as atrial fibrillation, hypertension, carotid artery disease, cardiomyopathy, heart valve disease, sickle cell disease, coronary artery disease, sleep disordered breathing or depression.  Provide anticipatory guidance to women of childbearing age regarding potential discontinuation of oral contraceptives and alternative family planning method.   Assess and address adherence to pharmacologic therapy that may include antihypertensive, antiplatelet, anticoagulant, statin, fibrate and vitamin supplement; monitor and manage side effects.  Consider providing or referring to group-based support and/or education program; assess for cognitive impairment that may impact ability to follow therapeutic regimen.  Prepare patient for periodic checking of anticoagulant or antiplatelet therapy  effectiveness.   Notes:     Task: Optimize Stroke Recovery   Due Date: 02/24/2021  Priority: Routine  Responsible User: Jon Billings, RN  Note:   Care Management Activities:    - depression screen reviewed - self or caregiver awareness of signs/symptoms of repeat stroke promoted    Notes: 01/11/21 Patient able to recognize signs of stroke.  Patient also adds that spouse is able to recognize symptoms and actually called 911  for his recent stroke.  Discussed medication adherence importance and follow up with physicians.   Stroke Symptoms Discussed Sudden numbness or weakness in the face, arm, or leg, especially on one side of the body. Sudden confusion, trouble speaking, or difficulty understanding speech. Sudden trouble seeing in one or both eyes. Sudden trouble walking, dizziness, loss of balance, or lack of coordination. Sudden severe headache with no known cause. Call 9-1-1 right away if you or someone else has any of these symptoms. During a stroke, every minute counts! Fast treatment Fast treatment can lessen the brain damage that stroke can cause. By knowing the signs and symptoms of stroke, you can take quick action and perhaps save a life.      Goals Addressed               This Visit's Progress     Keep or Improve My Strength-Stroke (pt-stated)        Timeframe:  Short-Term Goal Priority:  High Start Date:     01/11/21                        Expected End Date:   06/26/21                    Follow Up Date 02/24/21   - attend 90 percent of physical therapy appointments - eat healthy to increase strength - know who to call for help if I fall    Why is this important?   Before the stroke you probably did not think much about being safe when you are up and about.  Now, it may be harder for you to get around.  It may also be easier for you to trip or fall.  It is common to have muscle weakness after a stroke. You may also feel like you cannot control an arm or leg.  It will be helpful to work with a physical therapist to get your strength and muscle control back.  It is good to stay as active as you can. Walking and stretching help you stay strong and flexible.  The physical therapist will develop an exercise program just for you.     Notes: 01/11/21 Patient in outpatient therapy. Patient reports making good progress. Using cane for ambulation but can do most aciivites without cane.         Track and Manage Heart Rate and Rhythm-Atrial Fibrillation        Barriers: Knowledge Health Behaviors Timeframe:  Long-Range Goal Priority:  High Start Date:    01/11/21                         Expected End Date:  06/26/21                     Follow Up Date 02/24/21    - check pulse (heart) rate once a day - make a plan to eat healthy - take medicine as prescribed  Why is this important?   Atrial fibrillation may have no symptoms. Sometimes the symptoms get worse or happen more often.  It is important to keep track of what your symptoms are and when they happen.  A change in symptoms is important to discuss with your doctor or nurse.  Being active and healthy eating will also help you manage your heart condition.     Notes: 01/11/21 Patient with persistent Atrial Fibrillation.  On xarelto.  Discussed medication adherence.  Atrial Fibrillation Management Discussed Take medication as prescribed See physicians as scheduled Monitor for chest pain, constant flutters in chest, shortness of breath, weakness, and feeling of passing out.   Monitor heart rate        Plan: RN CM will provide ongoing education and support to patient through phone calls.   RN CM will send welcome packet with consent to patient.   RN CM will send initial barriers letter, assessment, and care plan to primary care physician.   Follow-up: Patient agrees to Care Plan and Follow-up. Follow-up in 2-3 week(s)  Jone Baseman, RN, MSN [place] [facility] Management Care Management Coordinator Direct Line [phone] Cell [phone] Toll Free: [phone]  Fax: [phone]

## 2021-01-12 ENCOUNTER — Encounter: Payer: Self-pay | Admitting: Occupational Therapy

## 2021-01-12 ENCOUNTER — Other Ambulatory Visit: Payer: Self-pay

## 2021-01-12 ENCOUNTER — Ambulatory Visit: Payer: Medicare HMO

## 2021-01-12 ENCOUNTER — Ambulatory Visit: Payer: Medicare HMO | Admitting: Occupational Therapy

## 2021-01-12 DIAGNOSIS — R2689 Other abnormalities of gait and mobility: Secondary | ICD-10-CM

## 2021-01-12 DIAGNOSIS — R2681 Unsteadiness on feet: Secondary | ICD-10-CM

## 2021-01-12 NOTE — Therapy (Signed)
Edward Pitt[place], [place], [address] Phone: [phone]   Fax:  [phone]  Occupational Therapy Evaluation Only  Patient Details  Name: Edward Pittman MRN: [id number] Date of Birth: [birth year] No data recorded  Encounter Date: 01/12/2021   OT End of Session - 01/12/21 1449     Visit Number 1    Number of Visits 1    Authorization Type humana medicare    Authorization Time Period $20 copay    OT Start Time 1320    OT Stop Time 1345    OT Time Calculation (min) 25 min    Activity Tolerance Patient tolerated treatment well    Behavior During Therapy WFL for tasks assessed/performed             Past Medical History:  Diagnosis Date   Chronic systolic dysfunction of left ventricle    Dyslipidemia    HTN (hypertension)    Hx of adenomatous colonic polyps    Hx of colonoscopy    Keloid    Mild mitral regurgitation by prior echocardiogram    Nonischemic cardiomyopathy ([place])    Persistent atrial fibrillation ([place])    s/p ablation 4/16  Dr. Rayann Heman    Past Surgical History:  Procedure Laterality Date   ATRIAL FIBRILLATION ABLATION N/A 10/11/2011   Procedure: ATRIAL FIBRILLATION ABLATION;  Surgeon: Thompson Grayer, MD;  [facility] C[facility] LAB;  Service: Cardiovascular;  Laterality: N/A;   COLONOSCOPY     FEMORAL HERNIA REPAIR     at age 43   HERNIA REPAIR     TEE WITHOUT CARDIOVERSION  10/10/2011   Procedure: TRANSESOPHAGEAL ECHOCARDIOGRAM (TEE);  Surgeon: Fay Records, MD;  Location: [facility] ENDOSCOPY;  Service: Cardiovascular;  Laterality: N/A;    There were no vitals filed for this visit.   Subjective Assessment - 01/12/21 1450     Subjective  Pt is a 81 year old male that presents to Neuro OPOT s/p CVA on 12/19/20. Pt reports only concern is balance. Pt reports getting back to PLOF with ADLs and IADLs.    Patient is accompanied by: Family member   spouse   Pertinent History PMH: chronic systolic  dysfunction of left ventricle, HTN, Keloid, Mild Mitral Regurgitation, nonischemic cardiomyopathy, AFib>s/p ablation    Limitations Fall Risk    Patient Stated Goals "get straightened out 100% and back to where I was before"    Currently in Pain? No/denies               [facility] OT Assessment - 01/12/21 1325       Assessment   Medical Diagnosis CVA    Onset Date/Surgical Date 12/19/20    Hand Dominance Right    Prior Therapy CIR      Precautions   Precautions Fall      Balance Screen   Has the patient fallen in the past 6 months No      Home  Environment   Family/patient expects to be discharged to: Private residence    Living Arrangements Spouse/significant other   1 dog + 1 bird   Type of [place] to live on main level with bedroom/bathroom    Bathroom Shower/Tub Tub/Shower unit    [place] - single point      Prior Function   Level of Independence Independent    Vocation Full time employment    Vocation Requirements auto body and paint  Leisure fishing      ADL   Eating/Feeding Modified independent    Grooming Independent    Lower Body Bathing Modified independent    Upper Body Dressing Independent    Lower Body Dressing Modified independent    Toilet Transfer Modified independent    Toileting - Clothing Manipulation Modified independent    [place]    Tub/Shower Transfer Modified independent      IADL   Prior Level of Function Shopping a little bit    Prior Level of Function Light Housekeeping did not do prior    Prior Level of Function Meal Prep at times    Meal Prep Plans, prepares and serves adequate meals independently    Prior Level of Function Community Mobility driving prior    Programmer, applications own vehicle    Medication Management Is responsible for taking medication in correct dosages at correct time    Physiological scientist financial matters independently (budgets,  writes checks, pays rent, bills goes to bank), collects and keeps track of income      Written Expression   Dominant Hand Right      Vision - History   Baseline Vision Wears glasses only for reading      Vision Assessment   Comment pt reports some increased blurriness with distance      Observation/Other Assessments   Focus on Therapeutic Outcomes (FOTO)  98%      Sensation   Light Touch Appears Intact    Hot/Cold Appears Intact      Coordination   9 Hole Peg Test Right;Left    Right 9 Hole Peg Test 32.13s    Left 9 Hole Peg Test 30.78s      ROM / Strength   AROM / PROM / Strength AROM;Strength      AROM   Overall AROM  Within functional limits for tasks performed      Strength   Overall Strength Within functional limits for tasks performed      Hand Function   Right Hand Gross Grasp Functional    Right Hand Grip (lbs) 98.1    Left Hand Gross Grasp Functional    Left Hand Grip (lbs) 86.8                                  OT Long Term Goals - 01/12/21 1505       OT LONG TERM GOAL #1   Title No goals. Eval only.                   Plan - 01/12/21 1501     Clinical Impression Statement Pt is a 80 year old male that presents to Neuro OPOT s/p CVA. Pt with PMH significant for chronic systolic dysfunction of left ventricle, HTN, Keloid, Mild Mitral Regurgitation, nonischemic cardiomyopathy, AFib>s/p ablation . Pt presents with primary concern of balance and unsteadiness on feet. Pt is currently seeing physical therapy for addressing gait and mobility. Skilled occupational therapy is not recommended at this time as patient is back to PLOF completing ADLs and IADLs.    OT Occupational Profile and History Problem Focused Assessment - Including review of records relating to presenting problem    Occupational performance deficits (Please refer to evaluation for details): --    Clinical Decision Making Limited treatment options, no task  modification necessary    Comorbidities Affecting Occupational Performance:  None    Modification or Assistance to Complete Evaluation  No modification of tasks or assist necessary to complete eval    OT Frequency One time visit    Plan one time visit.    Consulted and Agree with Plan of Care Family member/caregiver;Patient    Family Member Consulted spouse               Visit Diagnosis: Unsteadiness on feet  Other abnormalities of gait and mobility    Problem List Patient Active Problem List   Diagnosis Date Noted   Embolic stroke ([place]) 00/34/9179   Pain due to onychomycosis of toenails of both feet 11/30/2020   Chronic atrial fibrillation ([place]) 06/01/2019   Stable angina pectoris ([place]) 05/28/2019   Type 2 diabetes mellitus with complication, without long-term current use of insulin ([place]) 11/16/2017   BPH (benign prostatic hyperplasia) 12/12/2013   Routine general medical examination at a health care facility 12/12/2013   Cardiomyopathy, nonischemic ([place]) 08/09/2011   Hyperlipidemia with target LDL less than 100 07/14/2007   MITRAL REGURGITATION, MILD 07/14/2007   Essential hypertension 07/14/2007   Persistent atrial fibrillation ([place]) 07/14/2007   DEGENERATIVE JOINT DISEASE, HIPS 07/14/2007    Zachery Conch MOT, OTR/L  01/12/2021, 6:21 PM  [facility] [address] [place], [place], [address] Phone: [phone]   Fax:  [phone]  Name: JAEDON SILER MRN: [id number] Date of Birth: December 06, 1940

## 2021-01-12 NOTE — Therapy (Signed)
[facility] [address] [place], [place], [address] Phone: [phone]   Fax:  [phone]  Physical Therapy Treatment  Patient Details  Name: Edward Pittman MRN: [id number] Date of Birth: [birth year] Referring Provider (PT): Ruta Hinds NP   Encounter Date: 01/12/2021   PT End of Session - 01/12/21 1244     Visit Number 6    Number of Visits 17    Date for PT Re-Evaluation 02/26/21    Authorization Type [employer]    Authorization Time Period 16 visits from 12/25/20 through 03/04/21    Authorization - Number of Visits 16    Progress Note Due on Visit 10    PT Start Time 66    PT Stop Time 1315    PT Time Calculation (min) 45 min    Equipment Utilized During Treatment Gait belt    Activity Tolerance Patient tolerated treatment well    Behavior During Therapy WFL for tasks assessed/performed             Past Medical History:  Diagnosis Date   Chronic systolic dysfunction of left ventricle    Dyslipidemia    HTN (hypertension)    Hx of adenomatous colonic polyps    Hx of colonoscopy    Keloid    Mild mitral regurgitation by prior echocardiogram    Nonischemic cardiomyopathy ([place])    Persistent atrial fibrillation ([place])    s/p ablation 4/16  Dr. Rayann Heman    Past Surgical History:  Procedure Laterality Date   ATRIAL FIBRILLATION ABLATION N/A 10/11/2011   Procedure: ATRIAL FIBRILLATION ABLATION;  Surgeon: Thompson Grayer, MD;  Location: [facility] CATH LAB;  Service: Cardiovascular;  Laterality: N/A;   COLONOSCOPY     FEMORAL HERNIA REPAIR     at age 22   HERNIA REPAIR     TEE WITHOUT CARDIOVERSION  10/10/2011   Procedure: TRANSESOPHAGEAL ECHOCARDIOGRAM (TEE);  Surgeon: Fay Records, MD;  Location: [facility] ENDOSCOPY;  Service: Cardiovascular;  Laterality: N/A;    There were no vitals filed for this visit.       [facility] PT Assessment - 01/12/21 0001       Transfers   Number of Reps 10 reps    Comments from [employer]irex                            [facility] Adult PT Treatment/Exercise - 01/12/21 0001       Transfers   Transfers Sit to Stand    Sit to Stand 6: Modified independent (Device/Increase time)    Stand to Sit 6: Modified independent (Device/Increase time)      Ambulation/Gait   Stairs Yes    Stairs Assistance 6: Modified independent (Device/Increase time)    Stair Management Technique Two rails;Alternating pattern    Number of Stairs 16    Gait Comments no LOB      Knee/Hip Exercises: Aerobic   Other Aerobic Scifit L 2 8' arms 8                 Balance Exercises - 01/12/21 0001       Balance Exercises: Standing   SLS with Vectors Foam/compliant surface;Intermittent upper extremity assist;Other reps (comment);Limitations    SLS with Vectors Limitations Tapping floor targets, starting with 2 unilateral, alt, cross body then single target alt cross body    Stepping Strategy Anterior;Lateral;Foam/compliant surface;10 reps;Limitations    Rockerboard Anterior/posterior;Limitations    Rockerboard Limitations no  UE support, 2 min hold time.    Tandem Gait Forward;Intermittent upper extremity support;5 reps;Limitations    Tandem Gait Limitations at countertop    Sidestepping 5 reps;Limitations    Sidestepping Limitations at countertop    Sit to Stand Foam/compliant surface;Limitations    Sit to Stand Limitations from airex, 10x arms crossed    Other Standing Exercises Runner step from airex                 PT Short Term Goals - 01/12/21 1420       PT SHORT TERM GOAL #1   Title Patient to demo I in HEP    Baseline See [place] notes    Time 4    Period Weeks    Status Partially Met    Target Date 01/29/21      PT SHORT TERM GOAL #2   Title Patient to ambulate 547f with S and LRAD across outdoor surfaces    Baseline 1148fusing RW and S in clinic    Time 4    Period Weeks    Status New    Target Date 01/29/21      PT SHORT TERM GOAL #3   Title  Patient to negotiate full flight of steps with single rail and step through pattern    Baseline 4 steps with single rail and step through pattern    Time 4    Period Weeks    Status New    Target Date 01/29/21      PT SHORT TERM GOAL #4   Title Assess floor transfers    Baseline TBD    Time 4    Period Weeks    Status New    Target Date 01/29/21               PT Long Term Goals - 12/31/20 1757       PT LONG TERM GOAL #1   Title independeant in final HEP (all STGs due 02/26/21)    Baseline Access Code: [email or web address]    Time 9    Period Weeks    Status New      PT LONG TERM GOAL #2   Title 100037fmbulation with LRAD across outdoor surfaces    Baseline 115f27f clinic with RW and S    Time 8    Period Weeks    Status New      PT LONG TERM GOAL #3   Title Achieve a FOTO score of 80    Baseline 64    Time 8    Period Weeks    Status New      PT LONG TERM GOAL #4   Title Increase DGI score to 20    Baseline 16 with RW    Time 8    Period Weeks    Status New      PT LONG TERM GOAL #5   Title Increase gait speed to >0.8 m/s with LRAD    Baseline 0.58 m/s with RW    Time 8    Period Weeks    Status New                   Plan - 01/12/21 1411     Clinical Impression Statement Focus of treatment was SLS tasks from foam suface emphasizing balance and accuate foot placement to floor targets. Patient contiues to struggle with single leg tasks and loses balance.  He is able to negotiate a full flight  of stairs with step through pattern but need of both rails.  Has cane but is reluctant to use it and tends to carry it.    Personal Factors and Comorbidities Comorbidity 1    Comorbidities CVA    Examination-Activity Limitations Locomotion Level;Transfers    Examination-Participation Restrictions Occupation    Stability/Clinical Decision Making Stable/Uncomplicated    Rehab Potential Good    PT Frequency 2x / week    PT Duration 8 weeks    PT  Treatment/Interventions ADLs/Self Care Home Management;Aquatic Therapy;DME Instruction;Gait training;Stair training;Functional mobility training;Therapeutic activities;Therapeutic exercise;Balance training;Neuromuscular re-education    PT Next Visit Plan challenge balance on foam surface, standing stepping tasks, floor transfers, floor targets for accuracy    PT Home Exercise Plan [email or web address]    Consulted and Agree with Plan of Care Patient;Family member/caregiver             Patient will benefit from skilled therapeutic intervention in order to improve the following deficits and impairments:  Abnormal gait, Difficulty walking, Decreased endurance, Decreased activity tolerance, Decreased balance, Decreased mobility, Decreased strength  Visit Diagnosis: Unsteadiness on feet  Other abnormalities of gait and mobility  Balance disorder     Problem List Patient Active Problem List   Diagnosis Date Noted   Embolic stroke ([place]) 50/51/0712   Pain due to onychomycosis of toenails of both feet 11/30/2020   Chronic atrial fibrillation ([place]) 06/01/2019   Stable angina pectoris ([place]) 05/28/2019   Type 2 diabetes mellitus with complication, without long-term current use of insulin ([place]) 11/16/2017   BPH (benign prostatic hyperplasia) 12/12/2013   Routine general medical examination at a health care facility 12/12/2013   Cardiomyopathy, nonischemic ([place]) 08/09/2011   Hyperlipidemia with target LDL less than 100 07/14/2007   MITRAL REGURGITATION, MILD 07/14/2007   Essential hypertension 07/14/2007   Persistent atrial fibrillation ([place]) 07/14/2007   DEGENERATIVE JOINT DISEASE, HIPS 07/14/2007    Lanice Shirts PT 01/12/2021, 2:24 PM  [facility] [address] [place], [place], [address] Phone: [phone]   Fax:  [phone]  Name: KURTIS ANASTASIA MRN: [id number] Date of Birth: 1940/08/10

## 2021-01-14 ENCOUNTER — Other Ambulatory Visit: Payer: Self-pay | Admitting: Cardiology

## 2021-01-14 ENCOUNTER — Ambulatory Visit: Payer: Medicare HMO | Admitting: Rehabilitative and Restorative Service Providers"

## 2021-01-14 ENCOUNTER — Ambulatory Visit: Payer: Medicare HMO | Admitting: Occupational Therapy

## 2021-01-15 ENCOUNTER — Telehealth: Payer: Medicare HMO

## 2021-01-15 ENCOUNTER — Telehealth: Payer: Self-pay | Admitting: Pharmacist

## 2021-01-15 DIAGNOSIS — Z1152 Encounter for screening for COVID-19: Secondary | ICD-10-CM | POA: Diagnosis not present

## 2021-01-15 NOTE — Telephone Encounter (Signed)
  Chronic Care Management   Outreach Note  01/15/2021 Name: Edward Pittman MRN: [id number] DOB: 29-Jul-1940  Referred by: [name], MD  Patient had a phone appointment scheduled with clinical pharmacist today.  An unsuccessful telephone outreach was attempted today. The patient was referred to the pharmacist for assistance with medications, care management and care coordination.   Patient will NOT be penalized in any way for missing a CCM appointment. The no-show fee does not apply.  If possible, a message was left to return call to: [phone] or to [facility]: Coco, PharmD, Para March, CPP Clinical Pharmacist [facility] [phone]

## 2021-01-15 NOTE — Progress Notes (Deleted)
Chronic Care Management Pharmacy Note  01/15/2021 Name:  Edward Pittman MRN:  [id number] DOB:  1941/06/26  Summary: ***  Recommendations/Changes made from today's visit: ***  Plan: ***  Subjective: Edward Pittman is an 80 y.o. year old male who is a primary patient of [name], MD.  The CCM team was consulted for assistance with disease management and care coordination needs.    Engaged with patient by telephone for follow up visit in response to provider referral for pharmacy case management and/or care coordination services.   Consent to Services:  The patient was given information about Chronic Care Management services, agreed to services, and gave verbal consent prior to initiation of services.  Please see initial visit note for detailed documentation.   Patient Care Team: [name], MD as PCP - General (Internal Medicine) Minus Breeding, MD as PCP - Cardiology (Cardiology) [facility], [facility] (Ophthalmology) Charlton Haws, [facility] as Pharmacist (Pharmacist) Jon Billings, RN as [place] Management  Patient walks on track several times per week.  He work as Psychologist, prison and probation services.  Recent office visits: 11/30/20 Dr Ronnald Ramp OV: chronic f/u; no med changes 02/04/20 Dr Ronnald Ramp OV: chronic f/u. Not taking Xigduo due to nausea. Changed to [place].  Recent consult visits: 03/30/20 Dr Alanda Slim (ophthalmology): f/u glaucoma  06/03/19 Dr Percival Spanish (cardiology): f/u Afib. Advised to monitor BP at home. No med changes.  Hospital visits: Medication Reconciliation was completed by comparing discharge summary, patient's EMR and Pharmacy list, and upon discussion with patient.  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 Discharge date was 12/22/20. Discharged from [facility].   Stroke due to being out of Xarelto for a week.  New?Medications Started at [facility] Discharge:?? -started atorvastatin 40 mg   Medications  Discontinued at Hospital Discharge: -Stopped simvastatin  Medications that remain the same after Hospital Discharge:??  -All other medications will remain the same.    Objective:  Lab Results  Component Value Date   CREATININE 0.95 12/22/2020   BUN 20 12/22/2020   GFR 78.00 11/30/2020   GFRNONAA >60 12/22/2020   GFRAA 90 02/04/2020   NA 135 12/22/2020   K 3.9 12/22/2020   CALCIUM 9.3 12/22/2020   CO2 27 12/22/2020    Lab Results  Component Value Date/Time   HGBA1C 6.9 (H) 12/20/2020 02:35 AM   HGBA1C 7.0 (H) 11/30/2020 02:07 PM   GFR 78.00 11/30/2020 02:07 PM   GFR 105.94 05/28/2019 09:42 AM   MICROALBUR 2.7 (H) 11/30/2020 02:07 PM   MICROALBUR 2.3 (H) 05/28/2019 09:42 AM    Last diabetic Eye exam:  Lab Results  Component Value Date/Time   HMDIABEYEEXA No Retinopathy 03/30/2020 12:00 AM    Last diabetic Foot exam: No results found for: HMDIABFOOTEX   Lab Results  Component Value Date   CHOL 125 12/20/2020   HDL 34 (L) 12/20/2020   LDLCALC 83 12/20/2020   TRIG 40 12/20/2020   CHOLHDL 3.7 12/20/2020    Hepatic Function Latest Ref Rng & Units 12/19/2020 11/30/2020 05/28/2019  Total Protein 6.5 - 8.1 g/dL 7.4 8.0 7.8  Albumin 3.5 - 5.0 g/dL 3.9 4.4 4.4  AST 15 - 41 U/L 70(H) 28 35  ALT 0 - 44 U/L 42 22 30  Alk Phosphatase 38 - 126 U/L 195(H) 235(H) 299(H)  Total Bilirubin 0.3 - 1.2 mg/dL 1.5(H) 1.0 1.0  Bilirubin, Direct 0.0 - 0.3 mg/dL - 0.2 0.2    Lab Results  Component Value  Date/Time   TSH 2.13 11/30/2020 02:07 PM   TSH 2.44 05/28/2019 09:42 AM    CBC Latest Ref Rng & Units 12/22/2020 12/21/2020 12/20/2020  WBC 4.0 - 10.5 K/uL 11.2(H) 13.4(H) 13.8(H)  Hemoglobin 13.0 - 17.0 g/dL 14.9 15.1 15.4  Hematocrit 39.0 - 52.0 % 43.2 44.1 44.6  Platelets 150 - 400 K/uL 179 184 179    No results found for: VD25OH  Clinical ASCVD: Yes - stable angina The ASCVD Risk score Mikey Bussing DC Jr., et al., 2013) failed to calculate for the following reasons:   The patient has  a prior MI or stroke diagnosis    Depression screen [facility] 2/9 01/11/2021 12/09/2019 05/28/2019  Decreased Interest 0 0 0  Down, Depressed, Hopeless 0 0 0  PHQ - 2 Score 0 0 0     CHA2DS2-VASc Score = 5  The patient's score is based upon: CHF History: No HTN History: Yes Diabetes History: Yes Stroke History: No Vascular Disease History: Yes Age Score: 2 Gender Score: 0     Social History   Tobacco Use  Smoking Status Former   Types: Cigarettes   Quit date: 08/09/1999   Years since quitting: 21.4  Smokeless Tobacco Never  Tobacco Comments   quit 9 years ago   BP Readings from Last 3 Encounters:  12/24/20 138/80  12/22/20 118/67  12/18/20 (!) 151/90   Pulse Readings from Last 3 Encounters:  12/24/20 84  12/22/20 71  12/18/20 90   Wt Readings from Last 3 Encounters:  12/24/20 220 lb 9.6 oz (100.1 kg)  12/19/20 234 lb (106.1 kg)  11/30/20 237 lb (107.5 kg)   BMI Readings from Last 3 Encounters:  12/24/20 29.10 kg/m  12/19/20 30.87 kg/m  11/30/20 31.27 kg/m    Assessment/Interventions: Review of patient past medical history, allergies, medications, health status, including review of consultants reports, laboratory and other test data, was performed as part of comprehensive evaluation and provision of chronic care management services.   SDOH:  (Social Determinants of Health) assessments and interventions performed: Yes  SDOH Screenings   Alcohol Screen: Not on file  Depression (PHQ2-9): Low Risk    PHQ-2 Score: 0  Financial Resource Strain: Medium Risk   Difficulty of Paying Living Expenses: Somewhat hard  Food Insecurity: No Food Insecurity   Worried About Charity fundraiser in the Last Year: Never true   Ran Out of Food in the Last Year: Never true  Housing: [place] Risk Score: 0  Physical Activity: Not on file  Social Connections: Not on file  Stress: Not on file  Tobacco Use: Medium Risk   Smoking Tobacco Use: Former   Smokeless  Tobacco Use: Never  Transportation Needs: Not on file     [place]  Allergies  Allergen Reactions   Benazepril Cough   Metformin And Related Nausea Only    Medications Reviewed Today     Reviewed by Zachery Conch, OT (Occupational Therapist) on 01/12/21 at 1440  Med List Status: <None>   Medication Order Taking? Sig Documenting Provider Last Dose Status Informant  atorvastatin (LIPITOR) 40 MG tablet [id number] No Take 1 tablet (40 mg total) by mouth daily. Einar Pheasant, NP Taking Active   B Complex-Biotin-FA (B COMPLETE) TABS [id number] No Take 1 tablet by mouth daily with breakfast. [provider] Taking Active Spouse/Significant Other  diltiazem (CARDIZEM CD) 240 MG 24 hr capsule [id number] No Take 1 capsule (240 mg total) by mouth  daily. [name], MD Taking Active Spouse/Significant Other  FARXIGA 10 MG TABS tablet [id number] No TAKE 1 TABLET(10 MG) BY MOUTH DAILY BEFORE Elita Boone [name], MD Taking Active   metoprolol tartrate (LOPRESSOR) 100 MG tablet [id number] No Take 1 tablet (100 mg total) by mouth 2 (two) times daily. NEED OV.  Patient taking differently: Take 100 mg by mouth 2 (two) times daily.   Minus Breeding, MD Taking Active Spouse/Significant Other  rivaroxaban (XARELTO) 20 MG TABS tablet [id number] No TAKE 1 TABLET(20 MG) BY MOUTH DAILY WITH SUPPER  Patient taking differently: Take 20 mg by mouth daily with supper.   Minus Breeding, MD Taking Active Spouse/Significant Other            Patient Active Problem List   Diagnosis Date Noted   Embolic stroke ([place]) 73/71/0626   Pain due to onychomycosis of toenails of both feet 11/30/2020   Chronic atrial fibrillation ([place]) 06/01/2019   Stable angina pectoris ([place]) 05/28/2019   Type 2 diabetes mellitus with complication, without long-term current use of insulin ([place]) 11/16/2017   BPH (benign prostatic hyperplasia) 12/12/2013   Routine general medical examination at a health  care facility 12/12/2013   Cardiomyopathy, nonischemic ([place]) 08/09/2011   Hyperlipidemia with target LDL less than 100 07/14/2007   MITRAL REGURGITATION, MILD 07/14/2007   Essential hypertension 07/14/2007   Persistent atrial fibrillation ([place]) 07/14/2007   DEGENERATIVE JOINT DISEASE, HIPS 07/14/2007    Immunization History  Administered Date(s) Administered   Fluad Quad(high Dose 65+) 05/28/2019   Moderna Sars-Covid-2 Vaccination 07/30/2019, 08/27/2019   Pneumococcal Conjugate-13 12/12/2013   Pneumococcal Polysaccharide-23 05/11/2016   Tdap 12/12/2013    Conditions to be addressed/monitored:  Hypertension, Hyperlipidemia, Diabetes, Atrial Fibrillation and Glaucoma  There are no care plans that you recently modified to display for this patient.    Medication Assistance:  -Xarelto and [place] are affordable until donut hole. Gave phone number for [employer] (888-XARELTO) for Xarelto assistance during donut hole. Pt declined needing help with [place].  Compliance/Adherence/Medication fill history: Care Gaps: Shingrix Covid booster (due 01/27/20)  Star-Rating Drugs: Farxiga - LF 11/26/20 x 30 ds Simvastatin - LF 11/30/20 x 90 ds  Patient's preferred pharmacy is:  [facility] [place], [address] [place] [address] Phone: [phone] Fax: [phone]  Uses pill box? Yes Pt endorses 100% compliance  We discussed: Current pharmacy is preferred with insurance plan and patient is satisfied with pharmacy services Patient decided to: Continue current medication management strategy  Care Plan and Follow Up Patient Decision:  Patient agrees to Care Plan and Follow-up.  Plan: Telephone follow up appointment with care management team member scheduled for:  6 months  Charlene Brooke, PharmD, BCACP Clinical Pharmacist [facility] [phone]   Current Barriers:  Unable to independently  afford treatment regimen Unable to independently monitor therapeutic efficacy  Pharmacist Clinical Goal(s):  Over the next 180 days, patient will verbalize ability to afford treatment regimen achieve adherence to monitoring guidelines and medication adherence to achieve therapeutic efficacy through collaboration with PharmD and provider.   Interventions: 1:1 collaboration with [name], MD regarding development and update of comprehensive plan of care as evidenced by provider attestation and co-signature Inter-disciplinary care team collaboration (see longitudinal plan of care) Comprehensive medication review performed; medication list updated in electronic medical record  Hypertension / NICM (BP goal <140/90) -{US controlled/uncontrolled:25276} - per patient report of home BP -Current  treatment: Diltiazem CD 240 mg daily Metoprolol tartrate 100 mg BID -Medications previously tried: benazepril, furosemide,  -Current home readings: unknown, however patient's granddaughter is a Marine scientist and checks occasionally, according to her his BP is "good" -Denies hypotensive/hypertensive symptoms -Counseled to monitor BP at home weekly, document, and provide log at future appointments -Recommended to continue current medication  Hyperlipidemia: (LDL goal < 100) -{US controlled/uncontrolled:25276} -Current treatment: Atorvastatin 40 mg daily -Current dietary patterns: drinks lemon-water -Current exercise habits: walks on track several times per week -Educated on Cholesterol goals; Benefits of statin for ASCVD risk reduction; -Recommended to continue current medication  Diabetes (A1c goal <7%) -{US controlled/uncontrolled:25276} -Current medications: Farxiga 10 mg daily -Medications previously tried: Xigduo  -Current home glucose readings: not checking, not indicated -Denies hypoglycemic/hyperglycemic symptoms -Educated on A1c and blood sugar goals; Exercise goal of 150 minutes per  week; -Recommended to continue current medication  Atrial Fibrillation (Goal: prevent stroke and major bleeding) -Controlled  -Hx embolic stroke 12/3565 after being out of Xarelto for a week -CHADSVASC: 5 -Current treatment: Rate control: Diltiazem CD 240 mg, metoprolol tartrate 100 mg BID Anticoagulation: Xarelto 20 mg daily -Medications previously tried: Tikosyn -Counseled on increased risk of stroke due to Afib and benefits of anticoagulation for stroke prevention; importance of adherence to anticoagulant exactly as prescribed; bleeding risk associated with Xarelto and importance of self-monitoring for signs/symptoms of bleeding; -Recommended to continue current medication -Assessed patient finances. Gave number for [employer] for cost savings on Xarelto during donut hole  Health Maintenance -Vaccine gaps: Flu, Covid booster -Current therapy:  Omega-3 fish oil -Counseled on bleeding risk with fish oil and Xarelto; of note patient's triglcyerides are great -Recommended to stop omega-3 supplement due to unnecessary bleeding risk  -Recommend to schedule 79-monthPCP follow up visit  Patient Goals/Self-Care Activities Patient will:  - take medications as prescribed -focus on medication adherence by pill box -check blood pressure weekly, document, and provide at future appointments -target a minimum of 150 minutes of moderate intensity exercise weekly

## 2021-01-18 DIAGNOSIS — Z1152 Encounter for screening for COVID-19: Secondary | ICD-10-CM | POA: Diagnosis not present

## 2021-01-19 ENCOUNTER — Encounter: Payer: Medicare HMO | Admitting: Occupational Therapy

## 2021-01-19 ENCOUNTER — Ambulatory Visit: Payer: Medicare HMO | Admitting: Physical Therapy

## 2021-01-21 ENCOUNTER — Ambulatory Visit: Payer: Medicare HMO | Admitting: Physical Therapy

## 2021-01-21 ENCOUNTER — Other Ambulatory Visit: Payer: Self-pay

## 2021-01-21 ENCOUNTER — Encounter: Payer: Medicare HMO | Admitting: Occupational Therapy

## 2021-01-21 DIAGNOSIS — Z1152 Encounter for screening for COVID-19: Secondary | ICD-10-CM | POA: Diagnosis not present

## 2021-01-21 DIAGNOSIS — R2689 Other abnormalities of gait and mobility: Secondary | ICD-10-CM

## 2021-01-21 DIAGNOSIS — R2681 Unsteadiness on feet: Secondary | ICD-10-CM

## 2021-01-21 NOTE — Therapy (Signed)
[facility] [address] [place], [place], [address] Phone: [phone]   Fax:  [phone]  Physical Therapy Treatment  Patient Details  Name: Edward Pittman MRN: [id number] Date of Birth: [birth year] Referring Provider (PT): Ruta Hinds NP   Encounter Date: 01/21/2021   PT End of Session - 01/21/21 1320     Visit Number 7    Number of Visits 17    Date for PT Re-Evaluation 02/26/21    Authorization Type [employer]    Authorization Time Period 16 visits from 12/25/20 through 03/04/21    Authorization - Visit Number 5    Authorization - Number of Visits 16    Progress Note Due on Visit 10    PT Start Time 1318    PT Stop Time 9470    PT Time Calculation (min) 40 min    Equipment Utilized During Treatment Gait belt    Activity Tolerance Patient tolerated treatment well    Behavior During Therapy WFL for tasks assessed/performed             Past Medical History:  Diagnosis Date   Chronic systolic dysfunction of left ventricle    Dyslipidemia    HTN (hypertension)    Hx of adenomatous colonic polyps    Hx of colonoscopy    Keloid    Mild mitral regurgitation by prior echocardiogram    Nonischemic cardiomyopathy ([place])    Persistent atrial fibrillation ([place])    s/p ablation 4/16  Dr. Rayann Heman    Past Surgical History:  Procedure Laterality Date   ATRIAL FIBRILLATION ABLATION N/A 10/11/2011   Procedure: ATRIAL FIBRILLATION ABLATION;  Surgeon: Thompson Grayer, MD;  Location[facility] C[facility] LAB;  Service: Cardiovascular;  Laterality: N/A;   COLONOSCOPY     FEMORAL HERNIA REPAIR     at age 75   HERNIA REPAIR     TEE WITHOUT CARDIOVERSION  10/10/2011   Procedure: TRANSESOPHAGEAL ECHOCARDIOGRAM (TEE);  Surgeon: Fay Records, MD;  Location[facility] ENDOSCOPY;  Service: Cardiovascular;  Laterality: N/A;    There were no vitals filed for this visit.   Subjective Assessment - 01/21/21 1318     Subjective No new complaints. No  falls or pain to report. Missed last Thursday due to a time mix up and Tuesday was cancelled because of clinic power outtage. HEP is going well, "I'm challenging them". Reports working in garage to put back on a car bumper with no issues.    Patient is accompained by: Family member   spouse   Pertinent History This 80 y.o. male admitted 6/24 with new onset dizziness and Rt sided weakness and facial droop.  Pt received tPA.  CT of head negative for intracranial abnormality, CTA > no LVO.  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 CT of abdomen and pelvis showed infarcts of bil. superior and inferior renal poles. MRI of the brain showed  Small acute infarcts in bilateral cerebellar hemispheres and at the junction of the pons and right middle cerebellar peduncle. Slight edema in the left cerebellum without mass effect.PMH Includes: Chronic systolic dysfunction of Lt ventricle, HTN, keloid, mild mitral regurgitation, nonischemic cardiomyopathy, A-FIb > s/p ablation,    How long can you sit comfortably? no issues    How long can you stand comfortably? >15 min    How long can you walk comfortably? >5 min    Patient Stated Goals wean  off of walker and improve mobility.    Currently in Pain? No/denies    Pain Score 0-No pain                   [facility] Adult PT Treatment/Exercise - 01/21/21 1321       Transfers   Transfers Sit to Stand;Stand to Sit    Sit to Stand 6: Modified independent (Device/Increase time)    Stand to Sit 6: Modified independent (Device/Increase time)      Ambulation/Gait   Ambulation/Gait Yes    Ambulation/Gait Assistance 5: Supervision    Ambulation/Gait Assistance Details had pt naming boy names a-z with gait on outdoors with intermittent use of cane, no change in cadence or loss of balance noted. min guard assist to supervision for safety.    Assistive device Straight cane    Gait  Pattern Step-through pattern;Decreased stride length;Decreased dorsiflexion - right;Decreased dorsiflexion - left;Decreased arm swing - right;Decreased arm swing - left    Ambulation Surface Level;Unlevel;Indoor;Outdoor;Paved;Gravel;Grass      Neuro Re-ed    Neuro Re-ed Details  for balance/NMR: gait around track while self tossing ball to name girl names a-w with supervision. Pt able to maintain pace and ball toss with cues to a name needed at times;                 Balance Exercises - 01/21/21 1341       Balance Exercises: Standing   SLS with Vectors Foam/compliant surface;Other reps (comment);Intermittent upper extremity assist;Limitations    SLS with Vectors Limitations on balance board ant/post direction with 2 cones on floor- alter forward, then cross foot taps for ~10 reps each with min guard to min assist, cues for light taps with no UE support, occasional touch to bars.    Rockerboard Anterior/posterior;Lateral;EO;EC;30 seconds;Other reps (comment);Limitations    Rockerboard Limitations on balance board in both ways with no UE support: rocking the board with EO, progressing to EC with min guard to min assist, cues on posture; holding the board steady for EC 30 sec's x 3 reps with min guard assist.                 PT Short Term Goals - 01/12/21 1420       PT SHORT TERM GOAL #1   Title Patient to demo I in HEP    Baseline See [place] notes    Time 4    Period Weeks    Status Partially Met    Target Date 01/29/21      PT SHORT TERM GOAL #2   Title Patient to ambulate 538f with S and LRAD across outdoor surfaces    Baseline 1174fusing RW and S in clinic    Time 4    Period Weeks    Status New    Target Date 01/29/21      PT SHORT TERM GOAL #3   Title Patient to negotiate full flight of steps with single rail and step through pattern    Baseline 4 steps with single rail and step through pattern    Time 4    Period Weeks    Status New    Target Date  01/29/21      PT SHORT TERM GOAL #4   Title Assess floor transfers    Baseline TBD    Time 4    Period Weeks    Status New    Target Date 01/29/21  PT Long Term Goals - 12/31/20 1757       PT LONG TERM GOAL #1   Title independeant in final HEP (all STGs due 02/26/21)    Baseline Access Code: [email or web address]    Time 9    Period Weeks    Status New      PT LONG TERM GOAL #2   Title 109f ambulation with LRAD across outdoor surfaces    Baseline 1160fin clinic with RW and S    Time 8    Period Weeks    Status New      PT LONG TERM GOAL #3   Title Achieve a FOTO score of 80    Baseline 64    Time 8    Period Weeks    Status New      PT LONG TERM GOAL #4   Title Increase DGI score to 20    Baseline 16 with RW    Time 8    Period Weeks    Status New      PT LONG TERM GOAL #5   Title Increase gait speed to >0.8 m/s with LRAD    Baseline 0.58 m/s with RW    Time 8    Period Weeks    Status New                   Plan - 01/21/21 1321     Clinical Impression Statement Today's skilled session focused on high level balance, multi tasking with gait and balance on compliant surfaces with no issues noted or reported. The pt is making great progress and has potential to meet goals early.    Personal Factors and Comorbidities Comorbidity 1    Comorbidities CVA    Examination-Activity Limitations Locomotion Level;Transfers    Examination-Participation Restrictions Occupation    Stability/Clinical Decision Making Stable/Uncomplicated    Rehab Potential Good    PT Frequency 2x / week    PT Duration 8 weeks    PT Treatment/Interventions ADLs/Self Care Home Management;Aquatic Therapy;DME Instruction;Gait training;Stair training;Functional mobility training;Therapeutic activities;Therapeutic exercise;Balance training;Neuromuscular re-education    PT Next Visit Plan challenge balance on foam surface, standing stepping tasks, floor transfers, floor targets for  accuracy    PT Home Exercise Plan [email or web address]    Consulted and Agree with Plan of Care Patient;Family member/caregiver             Patient will benefit from skilled therapeutic intervention in order to improve the following deficits and impairments:  Abnormal gait, Difficulty walking, Decreased endurance, Decreased activity tolerance, Decreased balance, Decreased mobility, Decreased strength  Visit Diagnosis: Unsteadiness on feet  Other abnormalities of gait and mobility  Balance disorder     Problem List Patient Active Problem List   Diagnosis Date Noted   Embolic stroke ([place]CG[place]0628/36/6294 Pain due to onychomycosis of toenails of both feet 11/30/2020   Chronic atrial fibrillation (HCK[place]12/10/2018   Stable angina pectoris (HCSod[place]12/06/2018   Type 2 diabetes mellitus with complication, without long-term current use of insulin (HCSand [place]05/23/2019   BPH (benign prostatic hyperplasia) 12/12/2013   Routine general medical examination at a health care facility 12/12/2013   Cardiomyopathy, nonischemic (HCPrince George's02/05/2012   Hyperlipidemia with target LDL less than 100 07/14/2007   MITRAL REGURGITATION, MILD 07/14/2007   Essential hypertension 07/14/2007   Persistent atrial fibrillation (HCHarrison01/17/2009   DEGENERATIVE JOINT DISEASE, HIPS 07/14/2007    KaWillow OraPTA, CLT Outpatient [facility] Re[facility] LLC[address]  [place], [place] [address] [phone] 01/22/21, 8:17 AM   Name: Edward Pittman MRN: [id number] Date of Birth: 06/17/1941

## 2021-01-26 ENCOUNTER — Other Ambulatory Visit: Payer: Self-pay

## 2021-01-26 ENCOUNTER — Encounter: Payer: Self-pay | Admitting: Physical Therapy

## 2021-01-26 ENCOUNTER — Ambulatory Visit: Payer: Medicare HMO | Attending: Internal Medicine | Admitting: Physical Therapy

## 2021-01-26 ENCOUNTER — Encounter: Payer: Medicare HMO | Admitting: Occupational Therapy

## 2021-01-26 ENCOUNTER — Telehealth: Payer: Self-pay | Admitting: Pharmacist

## 2021-01-26 DIAGNOSIS — R2681 Unsteadiness on feet: Secondary | ICD-10-CM | POA: Insufficient documentation

## 2021-01-26 DIAGNOSIS — R2689 Other abnormalities of gait and mobility: Secondary | ICD-10-CM | POA: Insufficient documentation

## 2021-01-26 NOTE — Patient Instructions (Signed)
Goals Addressed               This Visit's Progress     Keep or Improve My Strength-Stroke (pt-stated)   On track     Timeframe:  Short-Term Goal Priority:  High Start Date:     01/11/21                        Expected End Date:   06/26/21                    Follow Up Date 02/24/21   - attend 90 percent of physical therapy appointments - eat healthy to increase strength - know who to call for help if I fall    Why is this important?   Before the stroke you probably did not think much about being safe when you are up and about.  Now, it may be harder for you to get around.  It may also be easier for you to trip or fall.  It is common to have muscle weakness after a stroke. You may also feel like you cannot control an arm or leg.  It will be helpful to work with a physical therapist to get your strength and muscle control back.  It is good to stay as active as you can. Walking and stretching help you stay strong and flexible.  The physical therapist will develop an exercise program just for you.     Notes: 01/11/21 Patient in outpatient therapy. Patient reports making good progress. Using cane for ambulation but can do most aciivites without cane.   01/26/21 Patient working with therapy but feels he will be discharged soon. 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  Discussed pacing self not to over do it.        Track and Manage Heart Rate and Rhythm-Atrial Fibrillation   Not on track     Barriers: Knowledge Health Behaviors Timeframe:  Long-Range Goal Priority:  High Start Date:    01/11/21                         Expected End Date:  06/26/21                     Follow Up Date 02/24/21    - make a plan to eat healthy - take medicine as prescribed  Check heart rate at least weekly   Why is this important?   Atrial fibrillation may have no symptoms. Sometimes the symptoms get worse or happen more often.  It is important to keep track of  what your symptoms are and when they happen.  A change in symptoms is important to discuss with your doctor or nurse.  Being active and healthy eating will also help you manage your heart condition.     Notes: 01/11/21 Patient with persistent Atrial Fibrillation.  On xarelto.  Discussed medication adherence.  Atrial Fibrillation Management Discussed Take medication as prescribed See physicians as scheduled Monitor for chest pain, constant flutters in chest, shortness of breath, weakness, and feeling of passing out.   Monitor heart rate. 01/26/21 Patient not checking heart rate at this time. Encouraged patient to check heart rate periodically.  Reiterated A. Fibrillation management.

## 2021-01-26 NOTE — Progress Notes (Signed)
    Chronic Care Management Pharmacy Assistant   Name: DRAKKAR SERGIO  MRN: [id number] DOB: [birth year]   Pharmacist Review Made a call to Mr. Skibicki this morning to see if he had picked up Xarelto since he had been discharged from the hospital. Mr. Deborde stated that he was able to pick it up from the pharmacy and has been taking every since.   [place] Pharmacist Assistant [phone]   Time spent:9

## 2021-01-26 NOTE — Patient Outreach (Signed)
[facility]) Care Management  [place]  01/26/2021   Edward Pittman 02-13-1941 [id number]  Subjective: Telephone call to patient for follow up. Patient reports doing well.  Continues with therapy.  Reiterated stroke symptoms. Patient voices no concerns.   Objective:   Encounter Medications:  Outpatient Encounter Medications as of 01/26/2021  Medication Sig   atorvastatin (LIPITOR) 40 MG tablet Take 1 tablet (40 mg total) by mouth daily.   B Complex-Biotin-FA (B COMPLETE) TABS Take 1 tablet by mouth daily with breakfast.   diltiazem (CARDIZEM CD) 240 MG 24 hr capsule Take 1 capsule (240 mg total) by mouth daily.   FARXIGA 10 MG TABS tablet TAKE 1 TABLET(10 MG) BY MOUTH DAILY BEFORE BREAKFAST   metoprolol tartrate (LOPRESSOR) 100 MG tablet TAKE 1 TABLET(100 MG) BY MOUTH TWICE DAILY   rivaroxaban (XARELTO) 20 MG TABS tablet TAKE 1 TABLET(20 MG) BY MOUTH DAILY WITH SUPPER (Patient taking differently: Take 20 mg by mouth daily with supper.)   No facility-administered encounter medications on file as of 01/26/2021.    Functional Status:  In your present state of health, do you have any difficulty performing the following activities: 01/11/2021  Hearing? N  Vision? N  Difficulty concentrating or making decisions? N  Walking or climbing stairs? N  Dressing or bathing? N  Doing errands, shopping? N  Preparing Food and eating ? N  Using the Toilet? N  In the past six months, have you accidently leaked urine? N  Do you have problems with loss of bowel control? N  Managing your Medications? N  Managing your Finances? N  Housekeeping or managing your Housekeeping? N  Some recent data might be hidden    Fall/Depression Screening: Fall Risk  01/11/2021 12/09/2019 05/28/2019  Falls in the past year? 0 0 0  Number falls in past yr: - 0 0  Injury with Fall? - 0 0  Risk for fall due to : - No Fall Risks -  Follow up - Falls evaluation completed Falls evaluation  completed   PHQ 2/9 Scores 01/11/2021 12/09/2019 05/28/2019 11/23/2018 11/16/2017 05/15/2016 12/13/2013  PHQ - 2 Score 0 0 0 0 0 0 0    Assessment:   Care Plan Care Plan : Atrial Fibrillation (Adult)  Updates made by Jon Billings, RN since 01/26/2021 12:00 AM     Problem: Stroke Risk (Atrial Fibrillation)      Long-Range Goal: Heart Rate and Rhythm Monitored and Managed as evidenced by patient reporting heart rate   Start Date: 01/11/2021  Expected End Date: 06/26/2021  This Visit's Progress: On track  Priority: High  Note:   Evidence-based guidance:  Assess heart rate, rhythm and presence of symptoms at each encounter using pulse palpation, blood pressure monitor and review of symptom diary.  Prepare patient for diagnostic studies, such as 24-hour ambulatory monitoring, echocardiogram or implantable loop recorder, based on presentation and risk factors, such as cryptogenic stroke.  Monitor side effects and expect periodic adjustments.  Provide an opportunity for shared decision-making when uncontrolled rate and rhythm persist and invasive therapy is considered, such as left atrial ablation, pacemaker, cardioversion or cardiac resynchronization therapy.  Provide reassurance, as initial symptoms and potential recurrence are frightening to patient and family/caregiver and may impact quality of life.  Provide prompt follow-up after hospitalization to support transition of care; consider referral to home care or community support program, especially in presence of comorbidity.  Encourage exercise 2 to 3 times per week, based on ability and  tolerance, to improve physical and cardiac function and quality of life.   Notes:     Task: Alleviate Barriers to Dysrhythmia Management   Due Date: 06/26/2021  Priority: Routine  Responsible User: Jon Billings, RN  Note:   Care Management Activities:    - pulse rate and rhythm assessed - reassurance provided - rescue (action) plan use encouraged     Notes: 01/11/21 Patient with persistent Atrial Fibrillation.  On xarelto.  Discussed medication adherence.  Atrial Fibrillation Management Discussed Take medication as prescribed See physicians as scheduled Monitor for chest pain, constant flutters in chest, shortness of breath, weakness, and feeling of passing out.   Monitor heart rate. 01/26/21 Patient reports Edward Pittman has not been checking his blood pressure and heart rate. Encouraged to check periodically.  Discussed A. Fibrillation Management.      Care Plan : Stroke (Adult)  Updates made by Jon Billings, RN since 01/26/2021 12:00 AM     Problem: Recurrence (Stroke)      Goal: Stroke Recurrence Prevented or Minimized as evidenced by no readmission related to stroke.   Start Date: 01/11/2021  Expected End Date: 02/24/2021  This Visit's Progress: On track  Note:   Evidence-based guidance:   Assess for sudden or new changes in attention, vision changes, language pattern, motor control and mobility, as well as facial drooping, unilateral weakness, confusion and fatigue that may indicate recurrence.  Promote long-term management of risk factors, such as tobacco use, sedentary lifestyle, obesity, sleep disordered breathing, unhealthy diet and high alcohol consumption.  Prepare patient for cardiac monitoring (invasive or noninvasive) when cause of stroke is unknown; consider ankle-brachial index measurement as a means of predicting recurrence risk.  Monitor and manage comorbidity, such as atrial fibrillation, hypertension, carotid artery disease, cardiomyopathy, heart valve disease, sickle cell disease, coronary artery disease, sleep disordered breathing or depression.  Assess and address adherence to pharmacologic therapy that may include antihypertensive, antiplatelet, anticoagulant, statin, fibrate and vitamin supplement; monitor and manage side effects.   Notes:     Task: Optimize Stroke Recovery   Due Date: 02/24/2021  Priority: Routine   Responsible User: Jon Billings, RN  Note:   Care Management Activities:    - self or caregiver awareness of signs/symptoms of repeat stroke promoted - signs/symptoms of comorbidities monitored - stroke signs/symptoms assessed and compared to baseline    Notes: 01/11/21 Patient able to recognize signs of stroke.  Patient also adds that spouse is able to recognize symptoms and actually called 911 for his recent stroke.  Discussed medication adherence importance and follow up with physicians.   Stroke Symptoms Discussed Sudden numbness or weakness in the face, arm, or leg, especially on one side of the body. Sudden confusion, trouble speaking, or difficulty understanding speech. Sudden trouble seeing in one or both eyes. Sudden trouble walking, dizziness, loss of balance, or lack of coordination. Sudden severe headache with no known cause. Call 9-1-1 right away if you or someone else has any of these symptoms. During a stroke, every minute counts! Fast treatment Fast treatment can lessen the brain damage that stroke can cause. By knowing the signs and symptoms of stroke, you can take quick action and perhaps save a life. 01/26/21 Patient continues to recover from stroke.  Active with therapy for balance training. Patient feels Edward Pittman will be discharged from therapy soon as Edward Pittman is walking independently at this time. Reiterated signs of stroke.        Goals Addressed  This Visit's Progress     Keep or Improve My Strength-Stroke (pt-stated)   On track     Timeframe:  Short-Term Goal Priority:  High Start Date:     01/11/21                        Expected End Date:   06/26/21                    Follow Up Date 02/24/21   - attend 90 percent of physical therapy appointments - eat healthy to increase strength - know who to call for help if I fall    Why is this important?   Before the stroke you probably did not think much about being safe when you are up and about.  Now, it  may be harder for you to get around.  It may also be easier for you to trip or fall.  It is common to have muscle weakness after a stroke. You may also feel like you cannot control an arm or leg.  It will be helpful to work with a physical therapist to get your strength and muscle control back.  It is good to stay as active as you can. Walking and stretching help you stay strong and flexible.  The physical therapist will develop an exercise program just for you.     Notes: 01/11/21 Patient in outpatient therapy. Patient reports making good progress. Using cane for ambulation but can do most aciivites without cane.   01/26/21 Patient working with therapy but feels Edward Pittman will be discharged soon. 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  Discussed pacing self not to over do it.        Track and Manage Heart Rate and Rhythm-Atrial Fibrillation   Not on track     Barriers: Knowledge Health Behaviors Timeframe:  Long-Range Goal Priority:  High Start Date:    01/11/21                         Expected End Date:  06/26/21                     Follow Up Date 02/24/21    - make a plan to eat healthy - take medicine as prescribed  Check heart rate at least weekly   Why is this important?   Atrial fibrillation may have no symptoms. Sometimes the symptoms get worse or happen more often.  It is important to keep track of what your symptoms are and when they happen.  A change in symptoms is important to discuss with your doctor or nurse.  Being active and healthy eating will also help you manage your heart condition.     Notes: 01/11/21 Patient with persistent Atrial Fibrillation.  On xarelto.  Discussed medication adherence.  Atrial Fibrillation Management Discussed Take medication as prescribed See physicians as scheduled Monitor for chest pain, constant flutters in chest, shortness of breath, weakness, and feeling of passing out.   Monitor heart rate. 01/26/21 Patient  not checking heart rate at this time. Encouraged patient to check heart rate periodically.  Reiterated A. Fibrillation management.          Plan:  Follow-up: Patient agrees to Care Plan and Follow-up. Follow-up in 4-6 week(s)  Jone Baseman, RN, MSN [place] Management Care Management Coordinator Direct Line [phone] Cell [phone]  Toll Free: [phone]  Fax: [phone]

## 2021-01-26 NOTE — Patient Instructions (Signed)
Access Code: [email or web address] URL: [email or web address] Date: 01/26/2021 Prepared by: Willow Ora  Exercises Sit to Stand with Arms Crossed - 2 x daily - 7 x weekly - 1 sets - 5 reps Tandem Walking with Counter Support - 1 x daily - 5 x weekly - 1 sets - 3 reps Romberg Stance Eyes Closed on Foam Pad - 1 x daily - 5 x weekly - 1 sets - 3 reps - 30 hold Wide Stance with Eyes Closed and Head Rotation on Foam Pad - 1 x daily - 5 x weekly - 1 sets - 10 reps

## 2021-01-27 ENCOUNTER — Ambulatory Visit: Payer: Medicare HMO | Admitting: Physical Therapy

## 2021-01-27 NOTE — Therapy (Addendum)
[facility] [address] [place], [place], [address] Phone: [phone]   Fax:  [phone]  Physical Therapy Treatment/DC Summary  Patient Details  Name: Edward Pittman MRN: [id number] Date of Birth: September 08, 1940 Referring Provider (PT): Ruta Hinds NP   Encounter Date: 01/26/2021   PT End of Session - 01/26/21 1407     Visit Number 8    Number of Visits 17    Date for PT Re-Evaluation 02/26/21    Authorization Type [employer]    Authorization Time Period 16 visits from 12/25/20 through 03/04/21    Authorization - Visit Number 6    Authorization - Number of Visits 16    Progress Note Due on Visit 10    PT Start Time 7121    PT Stop Time 9758    PT Time Calculation (min) 42 min    Equipment Utilized During Treatment Gait belt    Activity Tolerance Patient tolerated treatment well    Behavior During Therapy WFL for tasks assessed/performed             Past Medical History:  Diagnosis Date   Chronic systolic dysfunction of left ventricle    Dyslipidemia    HTN (hypertension)    Hx of adenomatous colonic polyps    Hx of colonoscopy    Keloid    Mild mitral regurgitation by prior echocardiogram    Nonischemic cardiomyopathy ([place])    Persistent atrial fibrillation ([place])    s/p ablation 4/16  Dr. Rayann Heman    Past Surgical History:  Procedure Laterality Date   ATRIAL FIBRILLATION ABLATION N/A 10/11/2011   Procedure: ATRIAL FIBRILLATION ABLATION;  Surgeon: Thompson Grayer, MD;  Location: [facility] CATH LAB;  Service: Cardiovascular;  Laterality: N/A;   COLONOSCOPY     FEMORAL HERNIA REPAIR     at age 22   HERNIA REPAIR     TEE WITHOUT CARDIOVERSION  10/10/2011   Procedure: TRANSESOPHAGEAL ECHOCARDIOGRAM (TEE);  Surgeon: Fay Records, MD;  Location: [facility] ENDOSCOPY;  Service: Cardiovascular;  Laterality: N/A;    There were no vitals filed for this visit.   Subjective Assessment - 01/26/21 1406     Subjective No new  complaints. No falls. Feels he is ready to be done with PT soon.    Patient is accompained by: Family member   spouse   Pertinent History This 80 y.o. male admitted 6/24 with new onset dizziness and Rt sided weakness and facial droop.  Pt received tPA.  CT of head negative for intracranial abnormality, CTA > no LVO.  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 CT of abdomen and pelvis showed infarcts of bil. superior and inferior renal poles. MRI of the brain showed  Small acute infarcts in bilateral cerebellar hemispheres and at the junction of the pons and right middle cerebellar peduncle. Slight edema in the left cerebellum without mass effect.PMH Includes: Chronic systolic dysfunction of Lt ventricle, HTN, keloid, mild mitral regurgitation, nonischemic cardiomyopathy, A-FIb > s/p ablation,    How long can you sit comfortably? no issues    How long can you stand comfortably? >15 min    How long can you walk comfortably? >5 min    Patient Stated Goals wean off of walker and improve mobility.    Currently in Pain? No/denies    Pain Score 0-No pain  [facility] PT Assessment - 01/26/21 1409       Dynamic Gait Index   Level Surface Normal    Change in Gait Speed Normal    Gait with Horizontal Head Turns Normal    Gait with Vertical Head Turns Normal    Gait and Pivot Turn Normal    Step Over Obstacle Normal    Step Around Obstacles Normal    Steps Normal    Total Score 24                  [facility] Adult PT Treatment/Exercise - 01/26/21 1409       Transfers   Transfers Sit to Stand;Stand to Sit;Floor to Transfer    Sit to Stand 6: Modified independent (Device/Increase time)    Stand to Sit 6: Modified independent (Device/Increase time)    Floor to Transfer 6: Modified independent (Device/Increase time)    Floor to Transfer Details (indicate cue type and reason)  standing<>kneeling on floor<>standing with UE assist, no physical assistance needed      Ambulation/Gait   Ambulation/Gait Yes    Ambulation/Gait Assistance 6: Modified independent (Device/Increase time)    Ambulation/Gait Assistance Details no balance issues noted    Ambulation Distance (Feet) 1000 Feet    Assistive device None    Gait Pattern Within Functional Limits    Ambulation Surface Level;Unlevel;Indoor;Outdoor;Paved    Gait velocity 8.56 sec's = 1.69 m/s    Stairs Yes    Stairs Assistance 6: Modified independent (Device/Increase time)    Stair Management Technique One rail Right;Alternating pattern;Forwards    Number of Stairs 4   x3 reps   Height of Stairs 6      Self-Care   Self-Care Other Self-Care Comments    Other Self-Care Comments  Reviewed HEP and advanced program this session. Refer to [facility] for full details; Administered FOTO survey with pt scoring Physical FS Primary Measure at 85 today, improved rom 66 at intake.             issued to HEP this session  Access Code: [email or web address] URL: [email or web address] Date: 01/26/2021 Prepared by: Willow Ora  Exercises Sit to Stand with Arms Crossed - 2 x daily - 7 x weekly - 1 sets - 5 reps Tandem Walking with Counter Support - 1 x daily - 5 x weekly - 1 sets - 3 reps Romberg Stance Eyes Closed on Foam Pad - 1 x daily - 5 x weekly - 1 sets - 3 reps - 30 hold Wide Stance with Eyes Closed and Head Rotation on Foam Pad - 1 x daily - 5 x weekly - 1 sets - 10 reps      PT Education - 01/26/21 1700     Education Details progress toward goasl with all STG/LTGs met; updated HEP. Agreeable to pt request to discharge at this time    Person(s) Educated Patient;Spouse    Methods Explanation;Demonstration;Verbal cues;Handout    Comprehension Verbalized understanding;Returned demonstration              PT Short Term Goals - 01/27/21 1430       PT SHORT TERM GOAL #1   Title Patient to demo I in HEP     Baseline 01/26/21: met with HEP    Status Achieved      PT SHORT TERM GOAL #2   Title Patient to ambulate 579f with S and LRAD across outdoor surfaces    Baseline 01/26/21: met today in session    Status Achieved  PT SHORT TERM GOAL #3   Title Patient to negotiate full flight of steps with single rail and step through pattern    Baseline 01/26/21: met in session today    Status Achieved      PT SHORT TERM GOAL #4   Title Assess floor transfers    Baseline 01/26/21: mod I with UE support    Status Achieved               PT Long Term Goals - 01/27/21 1431       PT LONG TERM GOAL #1   Title independeant in final HEP (all STGs due 02/26/21)    Baseline 01/26/21: met with advanced program this session    Status Achieved      PT LONG TERM GOAL #2   Title 1055f ambulation with LRAD across outdoor surfaces    Baseline 01/26/21: met with no AD at Mod I level this session    Status Achieved      PT LONG TERM GOAL #3   Title Achieve a FOTO score of 80    Baseline 01/26/21: pt scored 85 on FOTO this session    Status Achieved      PT LONG TERM GOAL #4   Title Increase DGI score to 20    Baseline 01/26/21: 24/24 scored this session    Status Achieved      PT LONG TERM GOAL #5   Title Increase gait speed to >0.8 m/s with LRAD    Baseline 01/26/21: 1.69 m/sec no AD    Time --    Period --    Status Achieved                   Plan - 01/26/21 1407     Clinical Impression Statement Today's skilled sesion focused on progress toward goals. Focused on both STGs and LTGs as pt requesting to wrap up if possible. Pt has met all goals, both STGs and LTGs. Will plan to discharge today with all goals met.    Personal Factors and Comorbidities Comorbidity 1    Comorbidities CVA    Examination-Activity Limitations Locomotion Level;Transfers    Examination-Participation Restrictions Occupation    Stability/Clinical Decision Making Stable/Uncomplicated    Rehab Potential Good    PT  Frequency 2x / week    PT Duration 8 weeks    PT Treatment/Interventions ADLs/Self Care Home Management;Aquatic Therapy;DME Instruction;Gait training;Stair training;Functional mobility training;Therapeutic activities;Therapeutic exercise;Balance training;Neuromuscular re-education    PT Next Visit Plan discharge    PT Home Exercise Plan [email or web address]    Consulted and Agree with Plan of Care Patient;Family member/caregiver             Patient will benefit from skilled therapeutic intervention in order to improve the following deficits and impairments:  Abnormal gait, Difficulty walking, Decreased endurance, Decreased activity tolerance, Decreased balance, Decreased mobility, Decreased strength  Visit Diagnosis: Unsteadiness on feet  Other abnormalities of gait and mobility     Problem List Patient Active Problem List   Diagnosis Date Noted   Embolic stroke ([place] 016/03/9603  Pain due to onychomycosis of toenails of both feet 11/30/2020   Chronic atrial fibrillation ([place] 06/01/2019   Stable angina pectoris ([place] 05/28/2019   Type 2 diabetes mellitus with complication, without long-term current use of insulin ([place] 11/16/2017   BPH (benign prostatic hyperplasia) 12/12/2013   Routine general medical examination at a health care facility 12/12/2013   Cardiomyopathy, nonischemic ([place] 08/09/2011   Hyperlipidemia with  target LDL less than 100 07/14/2007   MITRAL REGURGITATION, MILD 07/14/2007   Essential hypertension 07/14/2007   Persistent atrial fibrillation ([place]) 07/14/2007   DEGENERATIVE JOINT DISEASE, HIPS 07/14/2007   PHYSICAL THERAPY DISCHARGE SUMMARY  Visits from Start of Care: 9  Current functional level related to goals / functional outcomes: Patient feels he has regained full function   Remaining deficits: Mild strength and endurance deficits   Education / Equipment: HEP   Patient agrees to discharge. Patient goals were partially met. Patient is being discharged  due to being pleased with the current functional level.   Willow Ora, PTA, [facility] [address], [place], [place] [address] [phone] 01/27/21, 3:51 PM   Name: Edward Pittman MRN: [id number] Date of Birth: Jan 15, 1941

## 2021-01-28 ENCOUNTER — Other Ambulatory Visit: Payer: Self-pay | Admitting: Internal Medicine

## 2021-01-28 DIAGNOSIS — R000 Tachycardia, unspecified: Secondary | ICD-10-CM

## 2021-01-28 DIAGNOSIS — I4819 Other persistent atrial fibrillation: Secondary | ICD-10-CM

## 2021-01-28 DIAGNOSIS — I10 Essential (primary) hypertension: Secondary | ICD-10-CM

## 2021-01-28 DIAGNOSIS — I428 Other cardiomyopathies: Secondary | ICD-10-CM

## 2021-01-29 ENCOUNTER — Ambulatory Visit: Payer: Self-pay

## 2021-01-29 DIAGNOSIS — Z1152 Encounter for screening for COVID-19: Secondary | ICD-10-CM | POA: Diagnosis not present

## 2021-02-02 ENCOUNTER — Encounter: Payer: Medicare HMO | Admitting: Occupational Therapy

## 2021-02-02 ENCOUNTER — Ambulatory Visit: Payer: Medicare HMO

## 2021-02-03 DIAGNOSIS — Z1152 Encounter for screening for COVID-19: Secondary | ICD-10-CM | POA: Diagnosis not present

## 2021-02-04 ENCOUNTER — Ambulatory Visit: Payer: Medicare HMO

## 2021-02-04 ENCOUNTER — Encounter: Payer: Medicare HMO | Admitting: Occupational Therapy

## 2021-02-09 ENCOUNTER — Ambulatory Visit: Payer: Medicare HMO

## 2021-02-09 ENCOUNTER — Encounter: Payer: Medicare HMO | Admitting: Occupational Therapy

## 2021-02-10 ENCOUNTER — Telehealth: Payer: Medicare HMO

## 2021-02-11 ENCOUNTER — Encounter: Payer: Medicare HMO | Admitting: Occupational Therapy

## 2021-02-11 ENCOUNTER — Ambulatory Visit: Payer: Medicare HMO

## 2021-02-11 ENCOUNTER — Inpatient Hospital Stay: Payer: Medicare HMO | Admitting: Adult Health

## 2021-02-11 DIAGNOSIS — Z1152 Encounter for screening for COVID-19: Secondary | ICD-10-CM | POA: Diagnosis not present

## 2021-02-16 ENCOUNTER — Ambulatory Visit: Payer: Medicare HMO

## 2021-02-16 ENCOUNTER — Encounter: Payer: Medicare HMO | Admitting: Occupational Therapy

## 2021-02-18 ENCOUNTER — Ambulatory Visit: Payer: Medicare HMO

## 2021-02-18 ENCOUNTER — Encounter: Payer: Medicare HMO | Admitting: Occupational Therapy

## 2021-02-20 DIAGNOSIS — Z1152 Encounter for screening for COVID-19: Secondary | ICD-10-CM | POA: Diagnosis not present

## 2021-02-23 ENCOUNTER — Other Ambulatory Visit: Payer: Self-pay

## 2021-02-23 NOTE — Patient Outreach (Signed)
[facility]) Care Management  [place]  02/23/2021   Edward Pittman 01-Feb-1941 [id number]  Subjective: Telephone call to patient for follow up. Patient reports doing good. Discussed stroke management.  No concerns.    Objective:   Encounter Medications:  Outpatient Encounter Medications as of 02/23/2021  Medication Sig   atorvastatin (LIPITOR) 40 MG tablet Take 1 tablet (40 mg total) by mouth daily.   B Complex-Biotin-FA (B COMPLETE) TABS Take 1 tablet by mouth daily with breakfast.   diltiazem (CARDIZEM CD) 240 MG 24 hr capsule TAKE 1 CAPSULE(240 MG) BY MOUTH DAILY   FARXIGA 10 MG TABS tablet TAKE 1 TABLET(10 MG) BY MOUTH DAILY BEFORE BREAKFAST   metoprolol tartrate (LOPRESSOR) 100 MG tablet TAKE 1 TABLET(100 MG) BY MOUTH TWICE DAILY   rivaroxaban (XARELTO) 20 MG TABS tablet TAKE 1 TABLET(20 MG) BY MOUTH DAILY WITH SUPPER (Patient taking differently: Take 20 mg by mouth daily with supper.)   No facility-administered encounter medications on file as of 02/23/2021.    Functional Status:  In your present state of health, do you have any difficulty performing the following activities: 01/11/2021  Hearing? N  Vision? N  Difficulty concentrating or making decisions? N  Walking or climbing stairs? N  Dressing or bathing? N  Doing errands, shopping? N  Preparing Food and eating ? N  Using the Toilet? N  In the past six months, have you accidently leaked urine? N  Do you have problems with loss of bowel control? N  Managing your Medications? N  Managing your Finances? N  Housekeeping or managing your Housekeeping? N  Some recent data might be hidden    Fall/Depression Screening: Fall Risk  01/11/2021 12/09/2019 05/28/2019  Falls in the past year? 0 0 0  Number falls in past yr: - 0 0  Injury with Fall? - 0 0  Risk for fall due to : - No Fall Risks -  Follow up - Falls evaluation completed Falls evaluation completed   PHQ 2/9 Scores 01/11/2021  12/09/2019 05/28/2019 11/23/2018 11/16/2017 05/15/2016 12/13/2013  PHQ - 2 Score 0 0 0 0 0 0 0    Assessment:   Care Plan Care Plan : Atrial Fibrillation (Adult)  Updates made by Jon Billings, RN since 02/23/2021 12:00 AM     Problem: Stroke Risk (Atrial Fibrillation)      Long-Range Goal: Heart Rate and Rhythm Monitored and Managed as evidenced by patient reporting heart rate   Start Date: 01/11/2021  Expected End Date: 06/26/2021  This Visit's Progress: On track  Recent Progress: On track  Priority: High  Note:   Evidence-based guidance:  Assess heart rate, rhythm and presence of symptoms at each encounter using pulse palpation, blood pressure monitor and review of symptom diary.  Prepare patient for diagnostic studies, such as 24-hour ambulatory monitoring, echocardiogram or implantable loop recorder, based on presentation and risk factors, such as cryptogenic stroke.  Monitor side effects and expect periodic adjustments.  Provide an opportunity for shared decision-making when uncontrolled rate and rhythm persist and invasive therapy is considered, such as left atrial ablation, pacemaker, cardioversion or cardiac resynchronization therapy.  Provide reassurance, as initial symptoms and potential recurrence are frightening to patient and family/caregiver and may impact quality of life.  Provide prompt follow-up after hospitalization to support transition of care; consider referral to home care or community support program, especially in presence of comorbidity.  Encourage exercise 2 to 3 times per week, based on ability and tolerance, to  improve physical and cardiac function and quality of life.   Notes:     Task: Alleviate Barriers to Dysrhythmia Management   Due Date: 06/26/2021  Priority: Routine  Responsible User: Jon Billings, RN  Note:   Care Management Activities:    - pulse rate and rhythm assessed - reassurance provided - rescue (action) plan use encouraged    Notes:  01/11/21 Patient with persistent Atrial Fibrillation.  On xarelto.  Discussed medication adherence.  Atrial Fibrillation Management Discussed Take medication as prescribed See physicians as scheduled Monitor for chest pain, constant flutters in chest, shortness of breath, weakness, and feeling of passing out.   Monitor heart rate. 01/26/21 Patient reports he has not been checking his blood pressure and heart rate. Encouraged to check periodically.  Discussed A. Fibrillation Management.   02/23/21 No reported heart rate. Reviewed A. Fib management. No concerns.      Care Plan : Stroke (Adult)  Updates made by Jon Billings, RN since 02/23/2021 12:00 AM     Problem: Recurrence (Stroke)      Goal: Stroke Recurrence Prevented or Minimized as evidenced by no readmission related to stroke. Completed 02/23/2021  Start Date: 01/11/2021  Expected End Date: 02/24/2021  Recent Progress: On track  Note:   Evidence-based guidance:   Assess for sudden or new changes in attention, vision changes, language pattern, motor control and mobility, as well as facial drooping, unilateral weakness, confusion and fatigue that may indicate recurrence.  Promote long-term management of risk factors, such as tobacco use, sedentary lifestyle, obesity, sleep disordered breathing, unhealthy diet and high alcohol consumption.  Prepare patient for cardiac monitoring (invasive or noninvasive) when cause of stroke is unknown; consider ankle-brachial index measurement as a means of predicting recurrence risk.  Monitor and manage comorbidity, such as atrial fibrillation, hypertension, carotid artery disease, cardiomyopathy, heart valve disease, sickle cell disease, coronary artery disease, sleep disordered breathing or depression.  Assess and address adherence to pharmacologic therapy that may include antihypertensive, antiplatelet, anticoagulant, statin, fibrate and vitamin supplement; monitor and manage side effects.   Notes:      Task: Optimize Stroke Recovery Completed 02/23/2021  Due Date: 02/24/2021  Priority: Routine  Responsible User: Jon Billings, RN  Note:   Care Management Activities:    - self or caregiver awareness of signs/symptoms of repeat stroke promoted - signs/symptoms of comorbidities monitored - stroke signs/symptoms assessed and compared to baseline    Notes: 01/11/21 Patient able to recognize signs of stroke.  Patient also adds that spouse is able to recognize symptoms and actually called 911 for his recent stroke.  Discussed medication adherence importance and follow up with physicians.   Stroke Symptoms Discussed Sudden numbness or weakness in the face, arm, or leg, especially on one side of the body. Sudden confusion, trouble speaking, or difficulty understanding speech. Sudden trouble seeing in one or both eyes. Sudden trouble walking, dizziness, loss of balance, or lack of coordination. Sudden severe headache with no known cause. Call 9-1-1 right away if you or someone else has any of these symptoms. During a stroke, every minute counts! Fast treatment Fast treatment can lessen the brain damage that stroke can cause. By knowing the signs and symptoms of stroke, you can take quick action and perhaps save a life. 01/26/21 Patient continues to recover from stroke.  Active with therapy for balance training. Patient feels he will be discharged from therapy soon as he is walking independently at this time. Reiterated signs of stroke.  Goals Addressed               This Visit's Progress     COMPLETED: Keep or Improve My Strength-Stroke (pt-stated)   On track     Timeframe:  Short-Term Goal Priority:  High Start Date:     01/11/21                        Expected End Date:   06/26/21                    Follow Up Date 02/24/21   - attend 90 percent of physical therapy appointments - eat healthy to increase strength - know who to call for help if I fall    Why is this important?    Before the stroke you probably did not think much about being safe when you are up and about.  Now, it may be harder for you to get around.  It may also be easier for you to trip or fall.  It is common to have muscle weakness after a stroke. You may also feel like you cannot control an arm or leg.  It will be helpful to work with a physical therapist to get your strength and muscle control back.  It is good to stay as active as you can. Walking and stretching help you stay strong and flexible.  The physical therapist will develop an exercise program just for you.     Notes: 01/11/21 Patient in outpatient therapy. Patient reports making good progress. Using cane for ambulation but can do most aciivites without cane.   01/26/21 Patient working with therapy but feels he will be discharged soon. 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  Discussed pacing self not to over do it.   02/23/21 Patient reports he is doing much better. Therapy completed.       Track and Manage Heart Rate and Rhythm-Atrial Fibrillation   On track     Barriers: Knowledge Health Behaviors Timeframe:  Long-Range Goal Priority:  High Start Date:    01/11/21                         Expected End Date:  06/26/21                     Follow Up Date 03/26/21 - make a plan to eat healthy - take medicine as prescribed  Check heart rate at least weekly   Why is this important?   Atrial fibrillation may have no symptoms. Sometimes the symptoms get worse or happen more often.  It is important to keep track of what your symptoms are and when they happen.  A change in symptoms is important to discuss with your doctor or nurse.  Being active and healthy eating will also help you manage your heart condition.     Notes: 01/11/21 Patient with persistent Atrial Fibrillation.  On xarelto.  Discussed medication adherence.  Atrial Fibrillation Management Discussed Take medication as prescribed See  physicians as scheduled Monitor for chest pain, constant flutters in chest, shortness of breath, weakness, and feeling of passing out.   Monitor heart rate. 01/26/21 Patient not checking heart rate at this time. Encouraged patient to check heart rate periodically.  Reiterated A. Fibrillation management.   02/23/21 Patient managing A. Fib. No heart rate reported from patient. Encouraged to check  heart rate at least weekly.  Reviewed heart failure management.         Plan:  Follow-up: Patient agrees to Care Plan and Follow-up. Follow-up in 3-6 week(s)  Edward Baseman, RN, MSN [place] Management Care Management Coordinator Direct Line [phone] Cell [phone] Toll Free: [phone]  Fax: [phone];

## 2021-02-23 NOTE — Patient Instructions (Signed)
Goals Addressed               This Visit's Progress     COMPLETED: Keep or Improve My Strength-Stroke (pt-stated)   On track     Timeframe:  Short-Term Goal Priority:  High Start Date:     01/11/21                        Expected End Date:   06/26/21                    Follow Up Date 02/24/21   - attend 90 percent of physical therapy appointments - eat healthy to increase strength - know who to call for help if I fall    Why is this important?   Before the stroke you probably did not think much about being safe when you are up and about.  Now, it may be harder for you to get around.  It may also be easier for you to trip or fall.  It is common to have muscle weakness after a stroke. You may also feel like you cannot control an arm or leg.  It will be helpful to work with a physical therapist to get your strength and muscle control back.  It is good to stay as active as you can. Walking and stretching help you stay strong and flexible.  The physical therapist will develop an exercise program just for you.     Notes: 01/11/21 Patient in outpatient therapy. Patient reports making good progress. Using cane for ambulation but can do most aciivites without cane.   01/26/21 Patient working with therapy but feels he will be discharged soon. 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  Discussed pacing self not to over do it.   02/23/21 Patient reports he is doing much better. Therapy completed.       Track and Manage Heart Rate and Rhythm-Atrial Fibrillation   On track     Barriers: Knowledge Health Behaviors Timeframe:  Long-Range Goal Priority:  High Start Date:    01/11/21                         Expected End Date:  06/26/21                     Follow Up Date 03/26/21 - make a plan to eat healthy - take medicine as prescribed  Check heart rate at least weekly   Why is this important?   Atrial fibrillation may have no symptoms. Sometimes the  symptoms get worse or happen more often.  It is important to keep track of what your symptoms are and when they happen.  A change in symptoms is important to discuss with your doctor or nurse.  Being active and healthy eating will also help you manage your heart condition.     Notes: 01/11/21 Patient with persistent Atrial Fibrillation.  On xarelto.  Discussed medication adherence.  Atrial Fibrillation Management Discussed Take medication as prescribed See physicians as scheduled Monitor for chest pain, constant flutters in chest, shortness of breath, weakness, and feeling of passing out.   Monitor heart rate. 01/26/21 Patient not checking heart rate at this time. Encouraged patient to check heart rate periodically.  Reiterated A. Fibrillation management.   02/23/21 Patient managing A. Fib. No heart rate reported from patient. Encouraged to check heart  rate at least weekly.  Reviewed heart failure management.

## 2021-03-04 ENCOUNTER — Other Ambulatory Visit: Payer: Self-pay | Admitting: Cardiology

## 2021-03-04 DIAGNOSIS — E785 Hyperlipidemia, unspecified: Secondary | ICD-10-CM

## 2021-03-04 NOTE — Telephone Encounter (Signed)
D/C 12/22/20

## 2021-03-08 DIAGNOSIS — Z1152 Encounter for screening for COVID-19: Secondary | ICD-10-CM | POA: Diagnosis not present

## 2021-03-11 DIAGNOSIS — Z1152 Encounter for screening for COVID-19: Secondary | ICD-10-CM | POA: Diagnosis not present

## 2021-03-12 ENCOUNTER — Telehealth: Payer: Self-pay | Admitting: Internal Medicine

## 2021-03-12 NOTE — Telephone Encounter (Signed)
Left message for patient to call me back at [phone] to schedule Medicare Annual Wellness Visit   Last AWV  12/09/19  Please schedule at anytime with [facility]-Nurse Health Advisor if patient calls the office back.    40 Minutes appointment   Any questions, please call me at [phone]  

## 2021-03-18 ENCOUNTER — Other Ambulatory Visit: Payer: Self-pay | Admitting: Cardiology

## 2021-03-18 DIAGNOSIS — I4819 Other persistent atrial fibrillation: Secondary | ICD-10-CM

## 2021-03-18 NOTE — Telephone Encounter (Signed)
Prescription refill request for Xarelto received.  Indication: afib Last office visit: [facility] 12/24/2020 Weight:100.2 kg Age: 80 yo  Scr: 0.95, 12/22/2020 CrCl: 91 ml/min   Refill sent

## 2021-03-22 DIAGNOSIS — E119 Type 2 diabetes mellitus without complications: Secondary | ICD-10-CM | POA: Diagnosis not present

## 2021-03-22 DIAGNOSIS — H401132 Primary open-angle glaucoma, bilateral, moderate stage: Secondary | ICD-10-CM | POA: Diagnosis not present

## 2021-03-23 ENCOUNTER — Other Ambulatory Visit: Payer: Self-pay

## 2021-03-23 NOTE — Patient Outreach (Signed)
[facility]) Care Management  [place]  03/23/2021   Edward Pittman Jan 02, 1941 [id number]  Subjective: Telephone call to patient for follow up. Patient doing well from stroke. He reports he is living with his A. Fib and knows to pace self and take medications as prescribed.  Discussed A. Fib. Management. No concerns.    Objective:   Encounter Medications:  Outpatient Encounter Medications as of 03/23/2021  Medication Sig   atorvastatin (LIPITOR) 40 MG tablet Take 1 tablet (40 mg total) by mouth daily.   B Complex-Biotin-FA (B COMPLETE) TABS Take 1 tablet by mouth daily with breakfast.   diltiazem (CARDIZEM CD) 240 MG 24 hr capsule TAKE 1 CAPSULE(240 MG) BY MOUTH DAILY   FARXIGA 10 MG TABS tablet TAKE 1 TABLET(10 MG) BY MOUTH DAILY BEFORE BREAKFAST   metoprolol tartrate (LOPRESSOR) 100 MG tablet TAKE 1 TABLET(100 MG) BY MOUTH TWICE DAILY   rivaroxaban (XARELTO) 20 MG TABS tablet TAKE 1 TABLET(20 MG) BY MOUTH DAILY WITH SUPPER   No facility-administered encounter medications on file as of 03/23/2021.    Functional Status:  In your present state of health, do you have any difficulty performing the following activities: 03/23/2021 01/11/2021  Hearing? N N  Vision? N N  Difficulty concentrating or making decisions? N N  Walking or climbing stairs? N N  Dressing or bathing? N N  Doing errands, shopping? N N  Preparing Food and eating ? N N  Using the Toilet? N N  In the past six months, have you accidently leaked urine? N N  Do you have problems with loss of bowel control? N N  Managing your Medications? N N  Managing your Finances? N N  Housekeeping or managing your Housekeeping? N N  Some recent data might be hidden    Fall/Depression Screening: Fall Risk  03/23/2021 01/11/2021 12/09/2019  Falls in the past year? 0 0 0  Number falls in past yr: - - 0  Injury with Fall? - - 0  Risk for fall due to : - - No Fall Risks  Follow up - - Falls evaluation  completed   PHQ 2/9 Scores 03/23/2021 01/11/2021 12/09/2019 05/28/2019 11/23/2018 11/16/2017 05/15/2016  PHQ - 2 Score 0 0 0 0 0 0 0    Assessment:   Care Plan Care Plan : Atrial Fibrillation (Adult)  Updates made by Jon Billings, RN since 03/23/2021 12:00 AM     Problem: Stroke Risk (Atrial Fibrillation)      Long-Range Goal: Heart Rate and Rhythm Monitored and Managed as evidenced by patient reporting heart rate   Start Date: 01/11/2021  Expected End Date: 12/24/2021  This Visit's Progress: On track  Recent Progress: On track  Priority: High  Note:   Evidence-based guidance:  Assess heart rate, rhythm and presence of symptoms at each encounter using pulse palpation, blood pressure monitor and review of symptom diary.  Prepare patient for diagnostic studies, such as 24-hour ambulatory monitoring, echocardiogram or implantable loop recorder, based on presentation and risk factors, such as cryptogenic stroke.  Monitor side effects and expect periodic adjustments.  Provide an opportunity for shared decision-making when uncontrolled rate and rhythm persist and invasive therapy is considered, such as left atrial ablation, pacemaker, cardioversion or cardiac resynchronization therapy.  Provide reassurance, as initial symptoms and potential recurrence are frightening to patient and family/caregiver and may impact quality of life.  Provide prompt follow-up after hospitalization to support transition of care; consider referral to home care or community  support program, especially in presence of comorbidity.  Encourage exercise 2 to 3 times per week, based on ability and tolerance, to improve physical and cardiac function and quality of life.   Notes:     Task: Alleviate Barriers to Dysrhythmia Management   Due Date: 06/26/2021  Priority: Routine  Responsible User: Jon Billings, RN  Note:   Care Management Activities:    - pulse rate and rhythm assessed - reassurance provided - rescue  (action) plan use encouraged    Notes: 01/11/21 Patient with persistent Atrial Fibrillation.  On xarelto.  Discussed medication adherence.  Atrial Fibrillation Management Discussed Take medication as prescribed See physicians as scheduled Monitor for chest pain, constant flutters in chest, shortness of breath, weakness, and feeling of passing out.   Monitor heart rate. 01/26/21 Patient reports he has not been checking his blood pressure and heart rate. Encouraged to check periodically.  Discussed A. Fibrillation Management.   02/23/21 No reported heart rate. Reviewed A. Fib management. No concerns.   03/23/21 Patient reports that heart rate has been less than 100.  No chest pain, shortness of breath, or flutters.  Discussed A. Fib management and diet. No concerns.        Goals Addressed             This Visit's Progress    Track and Manage Heart Rate and Rhythm-Atrial Fibrillation   On track    Barriers: Knowledge Health Behaviors Timeframe:  Long-Range Goal Priority:  High Start Date:    01/11/21                         Expected End Date:  06/26/21                     Follow Up Date 04/26/21 - make a plan to eat healthy - take medicine as prescribed  Check heart rate at least weekly Exercise as tolerated   Why is this important?   Atrial fibrillation may have no symptoms. Sometimes the symptoms get worse or happen more often.  It is important to keep track of what your symptoms are and when they happen.  A change in symptoms is important to discuss with your doctor or nurse.  Being active and healthy eating will also help you manage your heart condition.     Notes: 01/11/21 Patient with persistent Atrial Fibrillation.  On xarelto.  Discussed medication adherence.  Atrial Fibrillation Management Discussed Take medication as prescribed See physicians as scheduled Monitor for chest pain, constant flutters in chest, shortness of breath, weakness, and feeling of passing out.    Monitor heart rate. 01/26/21 Patient not checking heart rate at this time. Encouraged patient to check heart rate periodically.  Reiterated A. Fibrillation management.   02/23/21 Patient managing A. Fib. No heart rate reported from patient. Encouraged to check heart rate at least weekly.  Reviewed A. Fib.  management.  03/23/21 Patient reports heart rate less than 100 when checked.  Patient exercising and eating healthy.  Reiterated A. Fib management.  Keep up the great work.          Plan:  Follow-up: Patient agrees to Care Plan and Follow-up. Follow-up in 4-6 week(s)  Jone Baseman, RN, MSN [place] Management Care Management Coordinator Direct Line [phone] Cell [phone] Toll Free: [phone]  Fax: [phone]

## 2021-03-23 NOTE — Patient Instructions (Signed)
Goals Addressed             This Visit's Progress    Track and Manage Heart Rate and Rhythm-Atrial Fibrillation   On track    Barriers: Knowledge Health Behaviors Timeframe:  Long-Range Goal Priority:  High Start Date:    01/11/21                         Expected End Date:  06/26/21                     Follow Up Date 04/26/21 - make a plan to eat healthy - take medicine as prescribed  Check heart rate at least weekly Exercise as tolerated   Why is this important?   Atrial fibrillation may have no symptoms. Sometimes the symptoms get worse or happen more often.  It is important to keep track of what your symptoms are and when they happen.  A change in symptoms is important to discuss with your doctor or nurse.  Being active and healthy eating will also help you manage your heart condition.     Notes: 01/11/21 Patient with persistent Atrial Fibrillation.  On xarelto.  Discussed medication adherence.  Atrial Fibrillation Management Discussed Take medication as prescribed See physicians as scheduled Monitor for chest pain, constant flutters in chest, shortness of breath, weakness, and feeling of passing out.   Monitor heart rate. 01/26/21 Patient not checking heart rate at this time. Encouraged patient to check heart rate periodically.  Reiterated A. Fibrillation management.   02/23/21 Patient managing A. Fib. No heart rate reported from patient. Encouraged to check heart rate at least weekly.  Reviewed A. Fib.  management.  03/23/21 Patient reports heart rate less than 100 when checked.  Patient exercising and eating healthy.  Reiterated A. Fib management.  Keep up the great work.

## 2021-03-24 ENCOUNTER — Encounter: Payer: Self-pay | Admitting: *Deleted

## 2021-03-24 DIAGNOSIS — Z1152 Encounter for screening for COVID-19: Secondary | ICD-10-CM | POA: Diagnosis not present

## 2021-03-26 DIAGNOSIS — Z1152 Encounter for screening for COVID-19: Secondary | ICD-10-CM | POA: Diagnosis not present

## 2021-03-29 ENCOUNTER — Other Ambulatory Visit: Payer: Self-pay

## 2021-03-29 NOTE — Patient Outreach (Signed)
[facility]) Care Management  03/29/2021  Edward Pittman [birth year] [id number]   Telephone outreach to patient to obtain mRS was successfully completed. MRS= 0   [employer] Care Management Assistant

## 2021-04-01 DIAGNOSIS — Z1152 Encounter for screening for COVID-19: Secondary | ICD-10-CM | POA: Diagnosis not present

## 2021-04-01 DIAGNOSIS — E785 Hyperlipidemia, unspecified: Secondary | ICD-10-CM | POA: Diagnosis not present

## 2021-04-01 LAB — LIPID PANEL
Chol/HDL Ratio: 3.1 ratio (ref 0.0–5.0)
Cholesterol, Total: 118 mg/dL (ref 100–199)
HDL: 38 mg/dL — ABNORMAL LOW (ref >39)
LDL Chol Calc (NIH): 68 mg/dL (ref 0–99)
Triglycerides: 52 mg/dL (ref 0–149)
VLDL Cholesterol Cal: 12 mg/dL (ref 5–40)

## 2021-04-09 DIAGNOSIS — Z1152 Encounter for screening for COVID-19: Secondary | ICD-10-CM | POA: Diagnosis not present

## 2021-04-20 ENCOUNTER — Other Ambulatory Visit: Payer: Self-pay

## 2021-04-20 NOTE — Patient Outreach (Signed)
[facility]) Care Management  04/20/2021  Edward Pittman February 22, 1941 [id number]   Telephone call to patient for disease management follow up.   No answer.  Unable to leave a message.   Plan: If no return call, RN CM will attempt patient again in the month of November.   Jone Baseman, RN, MSN [place] Management Care Management Coordinator Direct Line [phone] Cell [phone] Toll Free: [phone]  Fax: [phone]

## 2021-04-21 ENCOUNTER — Telehealth: Payer: Self-pay

## 2021-04-21 NOTE — Telephone Encounter (Signed)
Applications for [facility] Glade and Xarelto PAP started and mailed to patient for completion   Will await return of forms   Tomasa Blase, PharmD Clinical Pharmacist, [place]

## 2021-04-21 NOTE — Progress Notes (Signed)
Patient states that he is still taking Farxiga 10mg  and is on the way to pick up his refill today. Patient states the medication is too expensive for him and he has to wait until he gets his social security to pay for it. Patient states he is interested in getting patient assistance for [place] and Xarelto. Patient states he had some samples of Xarelto he was using until he was able to pick up his next refill.  Orinda Kenner, [place] Clinical Pharmacists Assistant [phone]

## 2021-05-14 ENCOUNTER — Other Ambulatory Visit: Payer: Self-pay

## 2021-05-14 DIAGNOSIS — Z1152 Encounter for screening for COVID-19: Secondary | ICD-10-CM | POA: Diagnosis not present

## 2021-05-14 NOTE — Patient Outreach (Signed)
[facility]) Care Management  05/14/2021  Kayveon Lennartz Aispuro 02-Aug-1940 [id number]   Telephone call to patient for disease management follow up.   No answer.  HIPAA compliant voice message left.    Plan: If no return call, RN CM will attempt patient again in the month of January.  Jone Baseman, RN, MSN [facility] Care Management Coordinator Direct Line [phone] Cell [phone] Toll Free: [phone]  Fax: [phone]

## 2021-05-17 ENCOUNTER — Other Ambulatory Visit: Payer: Self-pay

## 2021-05-17 NOTE — Patient Outreach (Signed)
[facility]) Care Management  05/17/2021  Gar Glance Brunson [birth year] [id number]   CMA made contact with patient per [facility] Management patient survey completed.  Ina [facility] Management Assistant [phone]

## 2021-05-25 ENCOUNTER — Ambulatory Visit: Payer: Self-pay

## 2021-05-30 NOTE — Progress Notes (Signed)
Cardiology Office Note   Date:  05/31/2021   ID:  Edward Pittman, DOB August 28, 1940, MRN [id number]  PCP:  [name], MD  Cardiologist:   Minus Breeding, MD    Chief Complaint  Patient presents with   Atrial Fibrillation        History of Present Illness: Edward Pittman is a 80 y.o. male who presents for follow up of atrial fib.   He had atrial fib and had ablation but had recurrent fib.   He had a CVA earlier this year.  He had been out of his Xarelto.  Unfortunately he ran out of his Xarelto again because he just was not paying attention.  He got some samples and he is back on it now.  He was out of it for about a week. The patient denies any new symptoms such as chest discomfort, neck or arm discomfort. There has been no new shortness of breath, PND or orthopnea. There have been no reported palpitations, presyncope or syncope.    Past Medical History:  Diagnosis Date   Chronic systolic dysfunction of left ventricle    Dyslipidemia    HTN (hypertension)    Hx of adenomatous colonic polyps    Hx of colonoscopy    Keloid    Mild mitral regurgitation by prior echocardiogram    Nonischemic cardiomyopathy ([place])    Persistent atrial fibrillation [facility])    s/p ablation 4/16  Dr. Rayann Heman    Past Surgical History:  Procedure Laterality Date   ATRIAL FIBRILLATION ABLATION N/A 10/11/2011   Procedure: ATRIAL FIBRILLATION ABLATION;  Surgeon: Thompson Grayer, MD;  Location: [facility] CATH LAB;  Service: Cardiovascular;  Laterality: N/A;   COLONOSCOPY     FEMORAL HERNIA REPAIR     at age 52   HERNIA REPAIR     TEE WITHOUT CARDIOVERSION  10/10/2011   Procedure: TRANSESOPHAGEAL ECHOCARDIOGRAM (TEE);  Surgeon: Fay Records, MD;  Location: [facility] ENDOSCOPY;  Service: Cardiovascular;  Laterality: N/A;     Current Outpatient Medications  Medication Sig Dispense Refill   atorvastatin (LIPITOR) 40 MG tablet Take 1 tablet (40 mg total) by mouth daily. 30 tablet 3   B Complex-Biotin-FA (B  COMPLETE) TABS Take 1 tablet by mouth daily with breakfast.     diltiazem (CARDIZEM CD) 240 MG 24 hr capsule TAKE 1 CAPSULE(240 MG) BY MOUTH DAILY 90 capsule 1   FARXIGA 10 MG TABS tablet TAKE 1 TABLET(10 MG) BY MOUTH DAILY BEFORE BREAKFAST 90 tablet 1   latanoprost (XALATAN) 0.005 % ophthalmic solution      metoprolol tartrate (LOPRESSOR) 100 MG tablet TAKE 1 TABLET(100 MG) BY MOUTH TWICE DAILY 180 tablet 3   rivaroxaban (XARELTO) 20 MG TABS tablet TAKE 1 TABLET(20 MG) BY MOUTH DAILY WITH SUPPER 90 tablet 1   sildenafil (REVATIO) 20 MG tablet Take 1 tablet (20 mg total) by mouth as needed. 20 tablet 3   No current facility-administered medications for this visit.    Allergies:   Benazepril and Metformin and related     ROS:  Please see the history of present illness.   Otherwise, review of systems are positive for ED.   All other systems are reviewed and negative.    PHYSICAL EXAM: VS:  BP (!) 142/78   Pulse 79   Ht 6\' 1"  (1.854 m)   Wt 226 lb (102.5 kg)   SpO2 97%   BMI 29.82 kg/m  , BMI Body mass index is 29.82 kg/m. GENERAL:  Well appearing NECK:  No jugular venous distention, waveform within normal limits, carotid upstroke brisk and symmetric, no bruits, no thyromegaly LUNGS:  Clear to auscultation bilaterally CHEST:  Unremarkable HEART:  PMI not displaced or sustained,S1 and S2 within normal limits, no S3, no clicks, no rubs, no murmurs, irregular ABD:  Flat, positive bowel sounds normal in frequency in pitch, no bruits, no rebound, no guarding, no midline pulsatile mass, no hepatomegaly, no splenomegaly EXT:  2 plus pulses throughout, no edema, no cyanosis no clubbing   EKG:  EKG is  ordered today. Atrial fibrillation, rate 79, right axis deviation, incomplete right bundle branch block, inferior T wave inversions slightly more pronounced than previous.   Recent Labs: 11/30/2020: TSH 2.13 12/19/2020: ALT 42 12/22/2020: BUN 20; Creatinine, Ser 0.95; Hemoglobin 14.9;  Platelets 179; Potassium 3.9; Sodium 135    Lipid Panel    Component Value Date/Time   CHOL 118 04/01/2021 0916   TRIG 52 04/01/2021 0916   HDL 38 (L) 04/01/2021 0916   CHOLHDL 3.1 04/01/2021 0916   CHOLHDL 3.7 12/20/2020 0235   VLDL 8 12/20/2020 0235   LDLCALC 68 04/01/2021 0916      Wt Readings from Last 3 Encounters:  05/31/21 226 lb (102.5 kg)  12/24/20 220 lb 9.6 oz (100.1 kg)  12/19/20 234 lb (106.1 kg)      Other studies Reviewed: Additional studies/ records that were reviewed today include: Labs Review of the above records demonstrates: See elsewhere   ASSESSMENT AND PLAN:   ATRIAL FIB:    [name] has a CHA2DS2 - VASc score of 5.    HTN:   The blood pressure is mildly elevated.  He can watch this and keep blood pressure diary home.  No change in therapy.   DYSLIPIDEMIA:   LDL was 68 with an HDL of 38.  No change in therapy.   CVA:   He has almost completely recovered from this.  He has a little trouble with his taste.  No change in therapy.  He is encouraged not to let himself run out of his anticoagulation.   ED: I am happy to give him a trial of Viagra   current medicines are reviewed at length with the patient today.  The patient does not have concerns regarding medicines.  The following changes have been made: As above  Labs/ tests ordered today include:  None  Orders Placed This Encounter  Procedures   EKG 12-Lead       Disposition:   FU with me in me in 12 months.    Signed, Minus Breeding, MD  05/31/2021 10:43 AM    [facility]

## 2021-05-31 ENCOUNTER — Ambulatory Visit: Payer: Medicare HMO | Admitting: Cardiology

## 2021-05-31 ENCOUNTER — Other Ambulatory Visit: Payer: Self-pay

## 2021-05-31 ENCOUNTER — Encounter: Payer: Self-pay | Admitting: Cardiology

## 2021-05-31 VITALS — BP 142/78 | HR 79 | Ht 73.0 in | Wt 226.0 lb

## 2021-05-31 DIAGNOSIS — I10 Essential (primary) hypertension: Secondary | ICD-10-CM

## 2021-05-31 DIAGNOSIS — I639 Cerebral infarction, unspecified: Secondary | ICD-10-CM

## 2021-05-31 DIAGNOSIS — E785 Hyperlipidemia, unspecified: Secondary | ICD-10-CM | POA: Diagnosis not present

## 2021-05-31 DIAGNOSIS — I4819 Other persistent atrial fibrillation: Secondary | ICD-10-CM

## 2021-05-31 MED ORDER — SILDENAFIL CITRATE 20 MG PO TABS: 20.0000 mg | ORAL_TABLET | ORAL | 3 refills | Status: AC | PRN

## 2021-05-31 NOTE — Patient Instructions (Signed)

## 2021-06-03 DIAGNOSIS — Z1152 Encounter for screening for COVID-19: Secondary | ICD-10-CM | POA: Diagnosis not present

## 2021-06-11 DIAGNOSIS — Z1152 Encounter for screening for COVID-19: Secondary | ICD-10-CM | POA: Diagnosis not present

## 2021-06-22 DIAGNOSIS — Z1152 Encounter for screening for COVID-19: Secondary | ICD-10-CM | POA: Diagnosis not present

## 2021-06-26 DIAGNOSIS — Z1152 Encounter for screening for COVID-19: Secondary | ICD-10-CM | POA: Diagnosis not present

## 2021-07-05 DIAGNOSIS — Z1152 Encounter for screening for COVID-19: Secondary | ICD-10-CM | POA: Diagnosis not present

## 2021-07-10 DIAGNOSIS — Z1152 Encounter for screening for COVID-19: Secondary | ICD-10-CM | POA: Diagnosis not present

## 2021-07-13 ENCOUNTER — Ambulatory Visit: Payer: Self-pay

## 2021-07-13 ENCOUNTER — Other Ambulatory Visit: Payer: Self-pay

## 2021-07-13 NOTE — Patient Outreach (Signed)
[facility]) Care Management  07/13/2021  Edward Pittman 10-01-1940 [id number]  CMA made telephone outreach as patient has upcoming appointment with care coordinator Jon Billings, RN. Patient's appointment cancelled and will await for RN's return to reschedule.  Ina Homes [facility] Management Assistant [phone]

## 2021-08-19 DIAGNOSIS — Z1152 Encounter for screening for COVID-19: Secondary | ICD-10-CM | POA: Diagnosis not present

## 2021-08-29 DIAGNOSIS — Z1152 Encounter for screening for COVID-19: Secondary | ICD-10-CM | POA: Diagnosis not present

## 2021-09-01 ENCOUNTER — Other Ambulatory Visit: Payer: Self-pay | Admitting: Internal Medicine

## 2021-09-01 DIAGNOSIS — I10 Essential (primary) hypertension: Secondary | ICD-10-CM

## 2021-09-01 DIAGNOSIS — R000 Tachycardia, unspecified: Secondary | ICD-10-CM

## 2021-09-01 DIAGNOSIS — I428 Other cardiomyopathies: Secondary | ICD-10-CM

## 2021-09-01 DIAGNOSIS — I4819 Other persistent atrial fibrillation: Secondary | ICD-10-CM

## 2021-09-03 DIAGNOSIS — Z1152 Encounter for screening for COVID-19: Secondary | ICD-10-CM | POA: Diagnosis not present

## 2021-09-09 ENCOUNTER — Other Ambulatory Visit: Payer: Self-pay

## 2021-09-09 NOTE — Patient Instructions (Signed)
Patient Goals/Self-Care Activities: A. Fibrillation and Hypertension ?Take all medications as prescribed ?Attend all scheduled provider appointments ?check pulse (heart) rate before taking medicine ?make a plan to eat healthy ?take medicine as prescribed ?check blood pressure weekly ?write blood pressure results in a log or diary ?take blood pressure log to all doctor appointments ?call doctor for signs and symptoms of high blood pressure ?take medications for blood pressure exactly as prescribed ?Limit salt intake ?

## 2021-09-09 NOTE — Patient Outreach (Signed)
[facility] ([facility]) Care Management ? ?09/09/2021 ? ?Edward Pittman ?12/06/1940 ?[id number] ? ? ?Telephone call to patient for disease management support. Patient reports doing well.  Patient reports blood pressure has been within normal limits. Encouraged blood pressure checks at least weekly.  No episodes of A. Fibrillation. Patient continues to take xarelto as prescribed.  No concerns.   ? ?Care Plan : Atrial Fibrillation (Adult)  ?Updates made by Leath, Dionne, RN since 09/09/2021 12:00 AM  ?  ? ?Problem: Stroke Risk (Atrial Fibrillation)   ?  ? ?Long-Range Goal: Heart Rate and Rhythm Monitored and Managed as evidenced by patient reporting heart rate Completed 09/09/2021  ?Start Date: 01/11/2021  ?Expected End Date: 12/24/2021  ?Recent Progress: On track  ?Priority: High  ?Note:   ?Evidence-based guidance:  ?Assess heart rate, rhythm and presence of symptoms at each encounter using pulse palpation, blood pressure monitor and review of symptom diary.  ?Prepare patient for diagnostic studies, such as 24-hour ambulatory monitoring, echocardiogram or implantable loop recorder, based on presentation and risk factors, such as cryptogenic stroke.  ?Monitor side effects and expect periodic adjustments.  ?Provide an opportunity for shared decision-making when uncontrolled rate and rhythm persist and invasive therapy is considered, such as left atrial ablation, pacemaker, cardioversion or cardiac resynchronization therapy.  ?Provide reassurance, as initial symptoms and potential recurrence are frightening to patient and family/caregiver and may impact quality of life.  ?Provide prompt follow-up after hospitalization to support transition of care; consider referral to home care or community support program, especially in presence of comorbidity.  ?Encourage exercise 2 to 3 times per week, based on ability and tolerance, to improve physical and cardiac function and quality of life.   ?Notes:  ?09/09/21 Resolving  Duplicate goal ?  ? ?Task: Alleviate Barriers to Dysrhythmia Management Completed 09/09/2021  ?Due Date: 06/26/2021  ?Priority: Routine  ?Responsible User: Leath, Dionne, RN  ?Note:   ?Care Management Activities:  ?  ?- pulse rate and rhythm assessed ?- reassurance provided ?- rescue (action) plan use encouraged  ?  ?Notes: 01/11/21 Patient with persistent Atrial Fibrillation.  On xarelto.  Discussed medication adherence.  Atrial Fibrillation Management Discussed ?Take medication as prescribed ?See physicians as scheduled ?Monitor for chest pain, constant flutters in chest, shortness of breath, weakness, and feeling of passing out.   ?Monitor heart rate. ?01/26/21 Patient reports he has not been checking his blood pressure and heart rate. Encouraged to check periodically.  Discussed A. Fibrillation Management.   ?02/23/21 No reported heart rate. Reviewed A. Fib management. No concerns.   ?03/23/21 Patient reports that heart rate has been less than 100.  No chest pain, shortness of breath, or flutters.  Discussed A. Fib management and diet. No concerns.   ?  ? ?Care Plan : Stroke (Adult)  ?Updates made by Leath, Dionne, RN since 09/09/2021 12:00 AM  ?Completed 09/09/2021  ? ?Problem: Recurrence (Stroke) Resolved 09/09/2021  ?  ? ?Care Plan : RN CM Care Plan  ?Updates made by Leath, Dionne, RN since 09/09/2021 12:00 AM  ?  ? ?Problem: Chronic Disease Management and care coordination needs of A. Fib. and HTN   ?Priority: High  ?  ? ?Long-Range Goal: Development of plan of care for needs of A. Fib. anf HTN   ?Start Date: 09/09/2021  ?Expected End Date: 06/26/2022  ?This Visit's Progress: On track  ?Priority: High  ?Note:   ?Current Barriers:  ?Chronic Disease Management support and education needs related to Atrial Fibrillation and HTN  ? ?  RNCM Clinical Goal(s):  ?Patient will verbalize basic understanding of  Atrial Fibrillation and HTN disease process and self health management plan as evidenced by No acte episodes of rapid  heart rate and blood pressure less than 140/80 ?continue to work with RN Care Manager to address care management and care coordination needs related to  Atrial Fibrillation and HTN as evidenced by adherence to CM Team Scheduled appointments through collaboration with RN Care manager, provider, and care team.  ? ?Interventions: ?Education and support related to A. Fib. And HTN ?Inter-disciplinary care team collaboration (see longitudinal plan of care) ?Evaluation of current treatment plan related to  self management and patient's adherence to plan as established by provider ? ? ?AFIB Interventions: (Status:  Goal on track:  Yes.) Long Term Goal ?  Counseled on increased risk of stroke due to Afib and benefits of anticoagulation for stroke prevention ?Reviewed importance of adherence to anticoagulant exactly as prescribed ?Afib action plan reviewed ? ? ?Hypertension Interventions:  (Status:  Goal on track:  Yes.) Long Term Goal ?Last practice recorded BP readings:  ?BP Readings from Last 3 Encounters:  ?05/31/21 (!) 142/78  ?12/24/20 138/80  ?12/22/20 118/67  ?Most recent eGFR/CrCl: No results found for: EGFR  No components found for: CRCL ? ?Evaluation of current treatment plan related to hypertension self management and patient's adherence to plan as established by provider ?Provided education to patient re: stroke prevention, s/s of heart attack and stroke ?Reviewed medications with patient and discussed importance of compliance ?Discussed plans with patient for ongoing care management follow up and provided patient with direct contact information for care management team ?Advised patient, providing education and rationale, to monitor blood pressure daily and record, calling PCP for findings outside established parameters ? ?Patient Goals/Self-Care Activities: A. Fibrillation and Hypertension ?Take all medications as prescribed ?Attend all scheduled provider appointments ?check pulse (heart) rate before taking  medicine ?make a plan to eat healthy ?take medicine as prescribed ?check blood pressure weekly ?write blood pressure results in a log or diary ?take blood pressure log to all doctor appointments ?call doctor for signs and symptoms of high blood pressure ?take medications for blood pressure exactly as prescribed ?Limit salt intake ? ?Follow Up Plan:  Telephone follow up appointment with care management team member scheduled for:  June ?The patient has been provided with contact information for the care management team and has been advised to call with any health related questions or concerns.  ? ?  ? ? ? ?Plan: Follow-up: Patient agrees to Care Plan and Follow-up. ?Follow-up in 3 months. ? ?Dionne J Leath, RN, MSN ?[facility] Care Management ?Care Management Coordinator ?Direct Line [phone] ?Toll Free: [phone]  ?Fax: [phone] ? ? ?

## 2021-09-11 DIAGNOSIS — Z1152 Encounter for screening for COVID-19: Secondary | ICD-10-CM | POA: Diagnosis not present

## 2021-09-16 DIAGNOSIS — Z1152 Encounter for screening for COVID-19: Secondary | ICD-10-CM | POA: Diagnosis not present

## 2021-09-20 DIAGNOSIS — H401132 Primary open-angle glaucoma, bilateral, moderate stage: Secondary | ICD-10-CM | POA: Diagnosis not present

## 2021-10-10 DIAGNOSIS — Z1152 Encounter for screening for COVID-19: Secondary | ICD-10-CM | POA: Diagnosis not present

## 2021-10-16 DIAGNOSIS — Z1152 Encounter for screening for COVID-19: Secondary | ICD-10-CM | POA: Diagnosis not present

## 2021-10-22 DIAGNOSIS — Z1152 Encounter for screening for COVID-19: Secondary | ICD-10-CM | POA: Diagnosis not present

## 2021-10-30 DIAGNOSIS — Z1152 Encounter for screening for COVID-19: Secondary | ICD-10-CM | POA: Diagnosis not present

## 2021-11-02 DIAGNOSIS — H401132 Primary open-angle glaucoma, bilateral, moderate stage: Secondary | ICD-10-CM | POA: Diagnosis not present

## 2021-11-05 DIAGNOSIS — Z1152 Encounter for screening for COVID-19: Secondary | ICD-10-CM | POA: Diagnosis not present

## 2021-11-09 LAB — HM DIABETES EYE EXAM

## 2021-11-12 DIAGNOSIS — Z1152 Encounter for screening for COVID-19: Secondary | ICD-10-CM | POA: Diagnosis not present

## 2021-11-19 DIAGNOSIS — Z1152 Encounter for screening for COVID-19: Secondary | ICD-10-CM | POA: Diagnosis not present

## 2021-12-03 DIAGNOSIS — Z1152 Encounter for screening for COVID-19: Secondary | ICD-10-CM | POA: Diagnosis not present

## 2021-12-09 ENCOUNTER — Other Ambulatory Visit: Payer: Self-pay

## 2021-12-09 NOTE — Patient Outreach (Signed)
[facility] Care Management  12/09/2021  Edward Pittman 11/21/1940 [id number]   Telephone call to patient for disease management follow up. Patient doing well and meeting goals. Discussed case closure. Patient agreeable.  No concerns.    Care Plan : Atrial Fibrillation (Adult)  Updates made by Jon Billings, RN since 12/09/2021 12:00 AM  Completed 12/09/2021   Problem: Stroke Risk (Atrial Fibrillation) Resolved 12/09/2021     Care Plan : RN CM Care Plan  Updates made by Jon Billings, RN since 12/09/2021 12:00 AM  Completed 12/09/2021   Problem: Chronic Disease Management and care coordination needs of A. Fib. and HTN Resolved 12/09/2021  Priority: High     Long-Range Goal: Development of plan of care for needs of A. Fib. anf HTN Completed 12/09/2021  Start Date: 09/09/2021  Expected End Date: 06/26/2022  This Visit's Progress: On track  Recent Progress: On track  Priority: High  Note:   Current Barriers:  Chronic Disease Management support and education needs related to Atrial Fibrillation and HTN   RNCM Clinical Goal(s):  Patient will verbalize basic understanding of  Atrial Fibrillation and HTN disease process and self health management plan as evidenced by No acte episodes of rapid heart rate and blood pressure less than 140/80 continue to work with RN Care Manager to address care management and care coordination needs related to  Atrial Fibrillation and HTN as evidenced by adherence to CM Team Scheduled appointments through collaboration with RN Care manager, provider, and care team.   Interventions: Education and support related to A. Fib. And HTN Inter-disciplinary care team collaboration (see longitudinal plan of care) Evaluation of current treatment plan related to  self management and patient's adherence to plan as established by provider   AFIB Interventions: (Status:  Goal on track:  Yes.) Long Term Goal   Counseled on increased risk of stroke due  to Afib and benefits of anticoagulation for stroke prevention Reviewed importance of adherence to anticoagulant exactly as prescribed Afib action plan reviewed   Hypertension Interventions:  (Status:  Goal on track:  Yes.) Long Term Goal Last practice recorded BP readings:  BP Readings from Last 3 Encounters:  05/31/21 (!) 142/78  12/24/20 138/80  12/22/20 118/67  Most recent eGFR/CrCl: No results found for: EGFR  No components found for: CRCL  Evaluation of current treatment plan related to hypertension self management and patient's adherence to plan as established by provider Provided education to patient re: stroke prevention, s/s of heart attack and stroke Reviewed medications with patient and discussed importance of compliance Discussed plans with patient for ongoing care management follow up and provided patient with direct contact information for care management team Advised patient, providing education and rationale, to monitor blood pressure daily and record, calling PCP for findings outside established parameters  12/09/21 Patient doing well.  No problems. Meeting goals.  No concerns.  Will graduate.    Patient Goals/Self-Care Activities: A. Fibrillation and Hypertension Take all medications as prescribed Attend all scheduled provider appointments check pulse (heart) rate before taking medicine make a plan to eat healthy take medicine as prescribed check blood pressure weekly write blood pressure results in a log or diary take blood pressure log to all doctor appointments call doctor for signs and symptoms of high blood pressure take medications for blood pressure exactly as prescribed Limit salt intake  Follow Up Plan: RN CM closing case     Plan: RN CM closed case.  Symphani Eckstrom Durwin Reges, RN, MSN [facility]  Care Management Care Management Coordinator Direct Line [phone] Toll Free: [phone]  Fax: [phone]

## 2021-12-14 DIAGNOSIS — Z1152 Encounter for screening for COVID-19: Secondary | ICD-10-CM | POA: Diagnosis not present

## 2021-12-16 DIAGNOSIS — Z1152 Encounter for screening for COVID-19: Secondary | ICD-10-CM | POA: Diagnosis not present

## 2021-12-30 DIAGNOSIS — Z1152 Encounter for screening for COVID-19: Secondary | ICD-10-CM | POA: Diagnosis not present

## 2022-01-06 DIAGNOSIS — Z1152 Encounter for screening for COVID-19: Secondary | ICD-10-CM | POA: Diagnosis not present

## 2022-01-21 ENCOUNTER — Ambulatory Visit (INDEPENDENT_AMBULATORY_CARE_PROVIDER_SITE_OTHER): Payer: Medicare PPO

## 2022-01-21 DIAGNOSIS — Z0000 Encounter for general adult medical examination without abnormal findings: Secondary | ICD-10-CM | POA: Diagnosis not present

## 2022-01-21 NOTE — Patient Instructions (Signed)
Edward Pittman , Thank you for taking time to come for your Medicare Wellness Visit. I appreciate your ongoing commitment to your health goals. Please review the following plan we discussed and let me know if I can assist you in the future.   Screening recommendations/referrals: Colonoscopy: Discontinued Recommended yearly ophthalmology/optometry visit for glaucoma screening and checkup Recommended yearly dental visit for hygiene and checkup  Vaccinations: Influenza vaccine: declined Pneumococcal vaccine: 12/12/2013, 05/11/2016 Tdap vaccine: 12/12/2013 Shingles vaccine: declined   Covid-19: 07/30/2019, 08/27/2019  Advanced directives: No  Conditions/risks identified: Yes  Next appointment: Please schedule your next Medicare Wellness Visit with your Nurse Health Advisor in 1 year by calling [phone].  Preventive Care 79 Years and Older, Male Preventive care refers to lifestyle choices and visits with your health care provider that can promote health and wellness. What does preventive care include? A yearly physical exam. This is also called an annual well check. Dental exams once or twice a year. Routine eye exams. Ask your health care provider how often you should have your eyes checked. Personal lifestyle choices, including: Daily care of your teeth and gums. Regular physical activity. Eating a healthy diet. Avoiding tobacco and drug use. Limiting alcohol use. Practicing safe sex. Taking low doses of aspirin every day. Taking vitamin and mineral supplements as recommended by your health care provider. What happens during an annual well check? The services and screenings done by your health care provider during your annual well check will depend on your age, overall health, lifestyle risk factors, and family history of disease. Counseling  Your health care provider may ask you questions about your: Alcohol use. Tobacco use. Drug use. Emotional well-being. Home and  relationship well-being. Sexual activity. Eating habits. History of falls. Memory and ability to understand (cognition). Work and work Statistician. Screening  You may have the following tests or measurements: Height, weight, and BMI. Blood pressure. Lipid and cholesterol levels. These may be checked every 5 years, or more frequently if you are over 3 years old. Skin check. Lung cancer screening. You may have this screening every year starting at age 78 if you have a 30-pack-year history of smoking and currently smoke or have quit within the past 15 years. Fecal occult blood test (FOBT) of the stool. You may have this test every year starting at age 74. Flexible sigmoidoscopy or colonoscopy. You may have a sigmoidoscopy every 5 years or a colonoscopy every 10 years starting at age 51. Prostate cancer screening. Recommendations will vary depending on your family history and other risks. Hepatitis C blood test. Hepatitis B blood test. Sexually transmitted disease (STD) testing. Diabetes screening. This is done by checking your blood sugar (glucose) after you have not eaten for a while (fasting). You may have this done every 1-3 years. Abdominal aortic aneurysm (AAA) screening. You may need this if you are a current or former smoker. Osteoporosis. You may be screened starting at age 68 if you are at high risk. Talk with your health care provider about your test results, treatment options, and if necessary, the need for more tests. Vaccines  Your health care provider may recommend certain vaccines, such as: Influenza vaccine. This is recommended every year. Tetanus, diphtheria, and acellular pertussis (Tdap, Td) vaccine. You may need a Td booster every 10 years. Zoster vaccine. You may need this after age 70. Pneumococcal 13-valent conjugate (PCV13) vaccine. One dose is recommended after age 48. Pneumococcal polysaccharide (PPSV23) vaccine. One dose is recommended after age 75. Talk to your  health care provider about which screenings and vaccines you need and how often you need them. This information is not intended to replace advice given to you by your health care provider. Make sure you discuss any questions you have with your health care provider. Document Released: 07/10/2015 Document Revised: 03/02/2016 Document Reviewed: 04/14/2015 Elsevier Interactive Patient Education  2017 [employer] Prevention in the Home Falls can cause injuries. They can happen to people of all ages. There are many things you can do to make your home safe and to help prevent falls. What can I do on the outside of my home? Regularly fix the edges of walkways and driveways and fix any cracks. Remove anything that might make you trip as you walk through a door, such as a raised step or threshold. Trim any bushes or trees on the path to your home. Use bright outdoor lighting. Clear any walking paths of anything that might make someone trip, such as rocks or tools. Regularly check to see if handrails are loose or broken. Make sure that both sides of any steps have handrails. Any raised decks and porches should have guardrails on the edges. Have any leaves, snow, or ice cleared regularly. Use sand or salt on walking paths during winter. Clean up any spills in your garage right away. This includes oil or grease spills. What can I do in the bathroom? Use night lights. Install grab bars by the toilet and in the tub and shower. Do not use towel bars as grab bars. Use non-skid mats or decals in the tub or shower. If you need to sit down in the shower, use a plastic, non-slip stool. Keep the floor dry. Clean up any water that spills on the floor as soon as it happens. Remove soap buildup in the tub or shower regularly. Attach bath mats securely with double-sided non-slip rug tape. Do not have throw rugs and other things on the floor that can make you trip. What can I do in the bedroom? Use night  lights. Make sure that you have a light by your bed that is easy to reach. Do not use any sheets or blankets that are too big for your bed. They should not hang down onto the floor. Have a firm chair that has side arms. You can use this for support while you get dressed. Do not have throw rugs and other things on the floor that can make you trip. What can I do in the kitchen? Clean up any spills right away. Avoid walking on wet floors. Keep items that you use a lot in easy-to-reach places. If you need to reach something above you, use a strong step stool that has a grab bar. Keep electrical cords out of the way. Do not use floor polish or wax that makes floors slippery. If you must use wax, use non-skid floor wax. Do not have throw rugs and other things on the floor that can make you trip. What can I do with my stairs? Do not leave any items on the stairs. Make sure that there are handrails on both sides of the stairs and use them. Fix handrails that are broken or loose. Make sure that handrails are as long as the stairways. Check any carpeting to make sure that it is firmly attached to the stairs. Fix any carpet that is loose or worn. Avoid having throw rugs at the top or bottom of the stairs. If you do have throw rugs, attach them to  the floor with carpet tape. Make sure that you have a light switch at the top of the stairs and the bottom of the stairs. If you do not have them, ask someone to add them for you. What else can I do to help prevent falls? Wear shoes that: Do not have high heels. Have rubber bottoms. Are comfortable and fit you well. Are closed at the toe. Do not wear sandals. If you use a stepladder: Make sure that it is fully opened. Do not climb a closed stepladder. Make sure that both sides of the stepladder are locked into place. Ask someone to hold it for you, if possible. Clearly mark and make sure that you can see: Any grab bars or handrails. First and last  steps. Where the edge of each step is. Use tools that help you move around (mobility aids) if they are needed. These include: Canes. Walkers. Scooters. Crutches. Turn on the lights when you go into a dark area. Replace any light bulbs as soon as they burn out. Set up your furniture so you have a clear path. Avoid moving your furniture around. If any of your floors are uneven, fix them. If there are any pets around you, be aware of where they are. Review your medicines with your doctor. Some medicines can make you feel dizzy. This can increase your chance of falling. Ask your doctor what other things that you can do to help prevent falls. This information is not intended to replace advice given to you by your health care provider. Make sure you discuss any questions you have with your health care provider. Document Released: 04/09/2009 Document Revised: 11/19/2015 Document Reviewed: 07/18/2014 Elsevier Interactive Patient Education  2017 [employer].

## 2022-01-21 NOTE — Progress Notes (Signed)
I connected with Art Kruse today by telephone and verified that I am speaking with the correct person using two identifiers. Location patient: home Location provider: work Persons participating in the virtual visit: patient, provider.   I discussed the limitations, risks, security and privacy concerns of performing an evaluation and management service by telephone and the availability of in person appointments. I also discussed with the patient that there may be a patient responsible charge related to this service. The patient expressed understanding and verbally consented to this telephonic visit.    Interactive audio and video telecommunications were attempted between this provider and patient, however failed, due to patient having technical difficulties OR patient did not have access to video capability.  We continued and completed visit with audio only.  Some vital signs may be absent or patient reported.   Time Spent with patient on telephone encounter: 30 minutes  Subjective:   Edward Pittman is a 81 y.o. male who presents for Medicare Annual/Subsequent preventive examination.  Review of Systems     Cardiac Risk Factors include: advanced age (>32mn, >>57women);diabetes mellitus;dyslipidemia;family history of premature cardiovascular disease;male gender;obesity (BMI >30kg/m2)     Objective:    There were no vitals filed for this visit. There is no height or weight on file to calculate BMI.     01/21/2022    1:05 PM 09/09/2021   10:17 AM 03/23/2021   10:34 AM 01/12/2021    2:40 PM 01/11/2021   12:13 PM 12/19/2020    1:39 PM 12/09/2019   10:28 AM  Advanced Directives  Does Patient Have a Medical Advance Directive? No No No No No No No  Would patient like information on creating a medical advance directive? No - Patient declined No - Patient declined No - Patient declined No - Patient declined No - Patient declined  No - Patient declined    Current Medications  (verified) Outpatient Encounter Medications as of 01/21/2022  Medication Sig   atorvastatin (LIPITOR) 40 MG tablet Take 1 tablet (40 mg total) by mouth daily.   B Complex-Biotin-FA (B COMPLETE) TABS Take 1 tablet by mouth daily with breakfast.   diltiazem (CARDIZEM CD) 240 MG 24 hr capsule TAKE 1 CAPSULE(240 MG) BY MOUTH DAILY   FARXIGA 10 MG TABS tablet TAKE 1 TABLET(10 MG) BY MOUTH DAILY BEFORE BREAKFAST   latanoprost (XALATAN) 0.005 % ophthalmic solution    metoprolol tartrate (LOPRESSOR) 100 MG tablet TAKE 1 TABLET(100 MG) BY MOUTH TWICE DAILY   rivaroxaban (XARELTO) 20 MG TABS tablet TAKE 1 TABLET(20 MG) BY MOUTH DAILY WITH SUPPER   sildenafil (REVATIO) 20 MG tablet Take 1 tablet (20 mg total) by mouth as needed.   No facility-administered encounter medications on file as of 01/21/2022.    Allergies (verified) Benazepril and Metformin and related   History: Past Medical History:  Diagnosis Date   Chronic systolic dysfunction of left ventricle    Dyslipidemia    HTN (hypertension)    Hx of adenomatous colonic polyps    Hx of colonoscopy    Keloid    Mild mitral regurgitation by prior echocardiogram    Nonischemic cardiomyopathy (HCC)    Persistent atrial fibrillation ([facility]    s/p ablation 4/16  Dr. ARayann Heman  Past Surgical History:  Procedure Laterality Date   ATRIAL FIBRILLATION ABLATION N/A 10/11/2011   Procedure: ATRIAL FIBRILLATION ABLATION;  Surgeon: JThompson Grayer MD;  [facility];  Service: Cardiovascular;  Laterality: N/A;   COLONOSCOPY  FEMORAL HERNIA REPAIR     at age 48   HERNIA REPAIR     TEE WITHOUT CARDIOVERSION  10/10/2011   Procedure: TRANSESOPHAGEAL ECHOCARDIOGRAM (TEE);  Surgeon: Fay Records, MD;  Location[facility]OSCOPY;  Service: Cardiovascular;  Laterality: N/A;   Family History  Problem Relation Age of Onset   Brain cancer Mother    Breast cancer Mother    Diabetes Sister    Breast cancer Sister    Brain cancer Sister    Heart attack  Father    Lung cancer Brother    Lung cancer Brother    Colon cancer Other        uncle   Prostate cancer Other        uncle   Diabetes Sister    Heart disease Neg Hx    Esophageal cancer Neg Hx    Rectal cancer Neg Hx    Stomach cancer Neg Hx    Social History   Socioeconomic History   Marital status: Married    Spouse name: Not on file   Number of children: 4   Years of education: Not on file   Highest education level: Not on file  Occupational History   Occupation: Autobody Repairman  Tobacco Use   Smoking status: Former    Types: Cigarettes    Quit date: 08/09/1999    Years since quitting: 22.4   Smokeless tobacco: Never   Tobacco comments:    quit 9 years ago  Vaping Use   Vaping Use: Never used  Substance and Sexual Activity   Alcohol use: No   Drug use: No   Sexual activity: Not Currently  Other Topics Concern   Not on file  Social History Narrative   Married   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      4 kids + grandkids      Former smoker, no current tobacco alcohol or drug use      Social Determinants of Radio broadcast assistant Strain: Low Risk  (01/21/2022)   Overall Financial Resource Strain (CARDIA)    Difficulty of Paying Living Expenses: Not hard at all  Food Insecurity: No Food Insecurity (01/21/2022)   Hunger Vital Sign    Worried About Running Out of Food in the Last Year: Never true    Ran Out of Food in the Last Year: Never true  Transportation Needs: No Transportation Needs (01/21/2022)   PRAPARE - Hydrologist (Medical): No    Lack of Transportation (Non-Medical): No  Physical Activity: Sufficiently Active (01/21/2022)   Exercise Vital Sign    Days of Exercise per Week: 5 days    Minutes of Exercise per Session: 30 min  Stress: No Stress Concern Present (01/21/2022)   [place]    Feeling of  Stress : Not at all  Social Connections: [place] (01/21/2022)   Social Connection and Isolation Panel [NHANES]    Frequency of Communication with Friends and Family: More than three times a week    Frequency of Social Gatherings with Friends and Family: More than three times a week    Attends Religious Services: More than 4 times per year    Active Member of [employer] or Organizations: Yes    Attends Music therapist: More than 4 times per year    Marital Status: Married    Tobacco  Counseling Counseling given: Not Answered Tobacco comments: quit 9 years ago   Clinical Intake:  Pre-visit preparation completed: Yes  Pain : No/denies pain     Nutritional Risks: None Diabetes: Yes CBG done?: No Did pt. bring in CBG monitor from home?: No  How often do you need to have someone help you when you read instructions, pamphlets, or other written materials from your doctor or pharmacy?: 1 - Never What is the last grade level you completed in school?: 9th grade  Diabetic? yes  Interpreter Needed?: No  Information entered by :: Lisette Abu, LPN.   Activities of Daily Living    01/21/2022    1:12 PM 03/23/2021   10:33 AM  In your present state of health, do you have any difficulty performing the following activities:  Hearing? 0 0  Vision? 0 0  Difficulty concentrating or making decisions? 0 0  Walking or climbing stairs? 0 0  Dressing or bathing? 0 0  Doing errands, shopping? 0 0  Preparing Food and eating ? N N  Using the Toilet? N N  In the past six months, have you accidently leaked urine? N N  Do you have problems with loss of bowel control? N N  Managing your Medications? N N  Managing your Finances? N N  Housekeeping or managing your Housekeeping? N N    Patient Care Team: [name], MD as PCP - General (Internal Medicine) Minus Breeding, MD as PCP - [facility]iology ([facility], [facility] (Ophthalmology) Charlton Haws, [facility] as Pharmacist (Pharmacist)  Indicate any recent Medical Services you may have received from other than [facility] providers in the past year (date may be approximate).     Assessment:   This is a routine wellness examination for Axxel.  Hearing/Vision screen Hearing Screening - Comments:: Patient denied any hearing difficulty.   No hearing aids.  Vision Screening - Comments:: Patient does wear corrective lenses/contacts.  Eye exam done by: [facility]   Dietary issues and exercise activities discussed: Current Exercise Habits: Home exercise routine, Type of exercise: walking;Other - see comments (yard work, gardening, Publishing rights manager), Time (Minutes): 30, Frequency (Times/Week): 5, Weekly Exercise (Minutes/Week): 150, Intensity: Moderate, Exercise limited by: cardiac condition(s);orthopedic condition(s)   Goals Addressed             This Visit's Progress    Continue to be active.        Depression Screen    01/21/2022    1:09 PM 09/09/2021   10:15 AM 03/23/2021   10:32 AM 01/11/2021   12:07 PM 12/09/2019   10:29 AM 05/28/2019    9:09 AM 11/23/2018    4:25 PM  PHQ 2/9 Scores  PHQ - 2 Score 0 0 0 0 0 0 0    Fall Risk    01/21/2022    1:06 PM 09/09/2021   10:15 AM 03/23/2021   10:32 AM 01/11/2021   12:04 PM 12/09/2019   10:29 AM  Fall Risk   Falls in the past year? 0 0 0 0 0  Number falls in past yr: 0    0  Injury with Fall? 0    0  Risk for fall due to : No Fall Risks    No Fall Risks  Follow up Falls evaluation completed    Falls evaluation completed    FALL RISK PREVENTION PERTAINING TO THE HOME:  Any stairs in or around the home? Yes  If so, are there any without  handrails? No  Home free of loose throw rugs in walkways, pet beds, electrical cords, etc? Yes  Adequate lighting in your home to reduce risk of falls? Yes   ASSISTIVE DEVICES UTILIZED TO PREVENT FALLS:  Life alert? No  Use of a cane, walker or w/c? No  Grab bars in the  bathroom? Yes  Shower chair or bench in shower? No  Elevated toilet seat or a handicapped toilet? Yes   TIMED UP AND GO:  Was the test performed? No .  Length of time to ambulate 10 feet: n/a sec.   Appearance of gait: Gait not evaluated during this visit.  Cognitive Function:        01/21/2022    1:14 PM 12/09/2019   10:31 AM  6CIT Screen  What Year? 0 points 0 points  What month? 0 points 0 points  What time? 0 points 0 points  Count back from 20 0 points 0 points  Months in reverse 0 points 0 points  Repeat phrase 0 points 0 points  Total Score 0 points 0 points    Immunizations Immunization History  Administered Date(s) Administered   Fluad Quad(high Dose 65+) 05/28/2019   Moderna Sars-Covid-2 Vaccination 07/30/2019, 08/27/2019   Pneumococcal Conjugate-13 12/12/2013   Pneumococcal Polysaccharide-23 05/11/2016   Tdap 12/12/2013    TDAP status: Up to date  Flu Vaccine status: Declined, Education has been provided regarding the importance of this vaccine but patient still declined. Advised may receive this vaccine at local pharmacy or Health Dept. Aware to provide a copy of the vaccination record if obtained from local pharmacy or Health Dept. Verbalized acceptance and understanding.  Pneumococcal vaccine status: Up to date  Covid-19 vaccine status: Completed vaccines  Qualifies for Shingles Vaccine? Yes   Zostavax completed No   Shingrix Completed?: No.    Education has been provided regarding the importance of this vaccine. Patient has been advised to call insurance company to determine out of pocket expense if they have not yet received this vaccine. Advised may also receive vaccine at local pharmacy or Health Dept. Verbalized acceptance and understanding.  Screening Tests Health Maintenance  Topic Date Due   Zoster Vaccines- Shingrix (1 of 2) Never done   COVID-19 Vaccine (3 - Moderna series) 10/22/2019   OPHTHALMOLOGY EXAM  03/30/2021   HEMOGLOBIN A1C   06/21/2021   Diabetic kidney evaluation - Urine ACR  11/30/2021   FOOT EXAM  11/30/2021   Diabetic kidney evaluation - GFR measurement  12/22/2021   INFLUENZA VACCINE  01/25/2022   TETANUS/TDAP  12/13/2023   Pneumonia Vaccine 22+ Years old  Completed   Hepatitis C Screening  Completed   HPV VACCINES  Aged Out   COLONOSCOPY (Pts 45-27yr Insurance coverage will need to be confirmed)  Discontinued    Health Maintenance  Health Maintenance Due  Topic Date Due   Zoster Vaccines- Shingrix (1 of 2) Never done   COVID-19 Vaccine (3 - Moderna series) 10/22/2019   OPHTHALMOLOGY EXAM  03/30/2021   HEMOGLOBIN A1C  06/21/2021   Diabetic kidney evaluation - Urine ACR  11/30/2021   FOOT EXAM  11/30/2021   Diabetic kidney evaluation - GFR measurement  12/22/2021    Colorectal cancer screening: No longer required.   Lung Cancer Screening: (Low Dose CT Chest recommended if Age 79-80years, 30 pack-year currently smoking OR have quit w/in 15years.) does not qualify.   Lung Cancer Screening Referral: no  Additional Screening:  Hepatitis C Screening: does qualify; Completed 12/12/2013  Vision Screening: Recommended annual ophthalmology exams for early detection of glaucoma and other disorders of the eye. Is the patient up to date with their annual eye exam?  Yes  Who is the provider or what is the name of the office in which the patient attends annual eye exams? [facility] If pt is not established with a provider, would they like to be referred to a provider to establish care? No .   Dental Screening: Recommended annual dental exams for proper oral hygiene  Community Resource Referral / Chronic Care Management: CRR required this visit?  No   CCM required this visit?  No      Plan:     I have personally reviewed and noted the following in the patient's chart:   Medical and social history Use of alcohol, tobacco or illicit drugs  Current medications and supplements  including opioid prescriptions. Patient is not currently taking opioid prescriptions. Functional ability and status Nutritional status Physical activity Advanced directives List of other physicians Hospitalizations, surgeries, and ER visits in previous 12 months Vitals Screenings to include cognitive, depression, and falls Referrals and appointments  In addition, I have reviewed and discussed with patient certain preventive protocols, quality metrics, and best practice recommendations. A written personalized care plan for preventive services as well as general preventive health recommendations were provided to patient.     Sheral Flow, LPN   08/19/3610    Nurse Notes: Patient is cogitatively intact. There were no vitals filed for this visit. There is no height or weight on file to calculate BMI. Patient stated that he has no issues with gait or balance; does not use any assistive devices.

## 2022-01-29 DIAGNOSIS — Z1152 Encounter for screening for COVID-19: Secondary | ICD-10-CM | POA: Diagnosis not present

## 2022-02-10 ENCOUNTER — Ambulatory Visit (INDEPENDENT_AMBULATORY_CARE_PROVIDER_SITE_OTHER): Payer: Medicare PPO | Admitting: Internal Medicine

## 2022-02-10 ENCOUNTER — Encounter: Payer: Self-pay | Admitting: Internal Medicine

## 2022-02-10 VITALS — BP 150/82 | HR 87 | Temp 98.4°F | Resp 16 | Ht 73.0 in | Wt 239.0 lb

## 2022-02-10 DIAGNOSIS — E785 Hyperlipidemia, unspecified: Secondary | ICD-10-CM

## 2022-02-10 DIAGNOSIS — I428 Other cardiomyopathies: Secondary | ICD-10-CM

## 2022-02-10 DIAGNOSIS — I482 Chronic atrial fibrillation, unspecified: Secondary | ICD-10-CM

## 2022-02-10 DIAGNOSIS — I10 Essential (primary) hypertension: Secondary | ICD-10-CM | POA: Diagnosis not present

## 2022-02-10 DIAGNOSIS — E118 Type 2 diabetes mellitus with unspecified complications: Secondary | ICD-10-CM

## 2022-02-10 DIAGNOSIS — I4819 Other persistent atrial fibrillation: Secondary | ICD-10-CM

## 2022-02-10 DIAGNOSIS — Z23 Encounter for immunization: Secondary | ICD-10-CM | POA: Diagnosis not present

## 2022-02-10 DIAGNOSIS — Z0000 Encounter for general adult medical examination without abnormal findings: Secondary | ICD-10-CM | POA: Diagnosis not present

## 2022-02-10 DIAGNOSIS — R000 Tachycardia, unspecified: Secondary | ICD-10-CM

## 2022-02-10 LAB — HEPATIC FUNCTION PANEL
ALT: 19 U/L (ref 0–53)
AST: 25 U/L (ref 0–37)
Albumin: 4.4 g/dL (ref 3.5–5.2)
Alkaline Phosphatase: 228 U/L — ABNORMAL HIGH (ref 39–117)
Bilirubin, Direct: 0.2 mg/dL (ref 0.0–0.3)
Total Bilirubin: 1 mg/dL (ref 0.2–1.2)
Total Protein: 8.2 g/dL (ref 6.0–8.3)

## 2022-02-10 LAB — CBC WITH DIFFERENTIAL/PLATELET
Basophils Absolute: 0 10*3/uL (ref 0.0–0.1)
Basophils Relative: 0.7 % (ref 0.0–3.0)
Eosinophils Absolute: 0.1 10*3/uL (ref 0.0–0.7)
Eosinophils Relative: 1.4 % (ref 0.0–5.0)
HCT: 45.6 % (ref 39.0–52.0)
Hemoglobin: 15.1 g/dL (ref 13.0–17.0)
Lymphocytes Relative: 35.4 % (ref 12.0–46.0)
Lymphs Abs: 2.5 10*3/uL (ref 0.7–4.0)
MCHC: 33.2 g/dL (ref 30.0–36.0)
MCV: 97.5 fl (ref 78.0–100.0)
Monocytes Absolute: 0.7 10*3/uL (ref 0.1–1.0)
Monocytes Relative: 9.8 % (ref 3.0–12.0)
Neutro Abs: 3.7 10*3/uL (ref 1.4–7.7)
Neutrophils Relative %: 52.7 % (ref 43.0–77.0)
Platelets: 160 10*3/uL (ref 150.0–400.0)
RBC: 4.67 Mil/uL (ref 4.22–5.81)
RDW: 13.9 % (ref 11.5–15.5)
WBC: 7 10*3/uL (ref 4.0–10.5)

## 2022-02-10 LAB — URINALYSIS, ROUTINE W REFLEX MICROSCOPIC
Bilirubin Urine: NEGATIVE
Ketones, ur: NEGATIVE
Leukocytes,Ua: NEGATIVE
Nitrite: NEGATIVE
Specific Gravity, Urine: 1.025 (ref 1.000–1.030)
Total Protein, Urine: NEGATIVE
Urine Glucose: NEGATIVE
Urobilinogen, UA: 1 (ref 0.0–1.0)
pH: 6 (ref 5.0–8.0)

## 2022-02-10 LAB — BASIC METABOLIC PANEL
BUN: 13 mg/dL (ref 6–23)
CO2: 26 mEq/L (ref 19–32)
Calcium: 9.3 mg/dL (ref 8.4–10.5)
Chloride: 102 mEq/L (ref 96–112)
Creatinine, Ser: 0.9 mg/dL (ref 0.40–1.50)
GFR: 81.49 mL/min (ref >60.00)
Glucose, Bld: 114 mg/dL — ABNORMAL HIGH (ref 70–99)
Potassium: 3.6 mEq/L (ref 3.5–5.1)
Sodium: 136 mEq/L (ref 135–145)

## 2022-02-10 LAB — MICROALBUMIN / CREATININE URINE RATIO
Creatinine,U: 188.3 mg/dL
Microalb Creat Ratio: 0.9 mg/g (ref 0.0–30.0)
Microalb, Ur: 1.6 mg/dL (ref 0.0–1.9)

## 2022-02-10 LAB — LIPID PANEL
Cholesterol: 195 mg/dL (ref 0–200)
HDL: 37.2 mg/dL — ABNORMAL LOW (ref >39.00)
LDL Cholesterol: 145 mg/dL — ABNORMAL HIGH (ref 0–99)
NonHDL: 157.98
Total CHOL/HDL Ratio: 5
Triglycerides: 63 mg/dL (ref 0.0–149.0)
VLDL: 12.6 mg/dL (ref 0.0–40.0)

## 2022-02-10 LAB — HEMOGLOBIN A1C: Hgb A1c MFr Bld: 6.9 % — ABNORMAL HIGH (ref 4.6–6.5)

## 2022-02-10 LAB — TSH: TSH: 1.83 u[IU]/mL (ref 0.35–5.50)

## 2022-02-10 MED ORDER — RIVAROXABAN 20 MG PO TABS: ORAL_TABLET | ORAL | 1 refills | Status: DC

## 2022-02-10 MED ORDER — SHINGRIX 50 MCG/0.5ML IM SUSR: 0.5000 mL | Freq: Once | INTRAMUSCULAR | 1 refills | Status: AC

## 2022-02-10 MED ORDER — DILTIAZEM HCL ER COATED BEADS 240 MG PO CP24: ORAL_CAPSULE | ORAL | 1 refills | Status: DC

## 2022-02-10 MED ORDER — ATORVASTATIN CALCIUM 40 MG PO TABS: 40.0000 mg | ORAL_TABLET | Freq: Every day | ORAL | 1 refills | Status: DC

## 2022-02-10 MED ORDER — EMPAGLIFLOZIN 25 MG PO TABS: 25.0000 mg | ORAL_TABLET | Freq: Every day | ORAL | 1 refills | Status: AC

## 2022-02-10 NOTE — Progress Notes (Signed)
Subjective:  Patient ID: Edward Pittman, male    DOB: 1940/10/16  Age: 81 y.o. MRN: [id number]  CC: Annual Exam, Hypertension, Diabetes, Atrial Fibrillation, and Hyperlipidemia   HPI Luvern H Brentlinger presents for a CPX and f/up -  He is active and denies chest pain, shortness of breath, diaphoresis, dizziness, or lightheadedness.  Outpatient Medications Prior to Visit  Medication Sig Dispense Refill   B Complex-Biotin-FA (B COMPLETE) TABS Take 1 tablet by mouth daily with breakfast.     latanoprost (XALATAN) 0.005 % ophthalmic solution      metoprolol tartrate (LOPRESSOR) 100 MG tablet TAKE 1 TABLET(100 MG) BY MOUTH TWICE DAILY 180 tablet 3   sildenafil (REVATIO) 20 MG tablet Take 1 tablet (20 mg total) by mouth as needed. 20 tablet 3   atorvastatin (LIPITOR) 40 MG tablet Take 1 tablet (40 mg total) by mouth daily. 30 tablet 3   diltiazem (CARDIZEM CD) 240 MG 24 hr capsule TAKE 1 CAPSULE(240 MG) BY MOUTH DAILY 90 capsule 1   FARXIGA 10 MG TABS tablet TAKE 1 TABLET(10 MG) BY MOUTH DAILY BEFORE BREAKFAST 90 tablet 1   rivaroxaban (XARELTO) 20 MG TABS tablet TAKE 1 TABLET(20 MG) BY MOUTH DAILY WITH SUPPER 90 tablet 1   No facility-administered medications prior to visit.    ROS Review of Systems  Constitutional:  Negative for chills, diaphoresis, fatigue and fever.  HENT: Negative.    Eyes: Negative.   Respiratory:  Negative for chest tightness, shortness of breath and wheezing.   Cardiovascular:  Negative for chest pain, palpitations and leg swelling.  Gastrointestinal:  Negative for abdominal pain, constipation, diarrhea, nausea and vomiting.  Endocrine: Negative.   Genitourinary: Negative.  Negative for difficulty urinating and dysuria.  Musculoskeletal: Negative.  Negative for arthralgias and myalgias.  Skin: Negative.   Neurological:  Negative for dizziness, weakness and headaches.  Hematological:  Negative for adenopathy. Does not bruise/bleed easily.   Psychiatric/Behavioral: Negative.      Objective:  BP (!) 150/82 (BP Location: Right Arm, Patient Position: Sitting, Cuff Size: Large)   Pulse 87   Temp 98.4 F (36.9 C) (Oral)   Resp 16   Ht '6\' 1"'$  (1.854 m)   Wt 239 lb (108.4 kg)   SpO2 95%   BMI 31.53 kg/m   BP Readings from Last 3 Encounters:  02/10/22 (!) 150/82  05/31/21 (!) 142/78  12/24/20 138/80    Wt Readings from Last 3 Encounters:  02/10/22 239 lb (108.4 kg)  05/31/21 226 lb (102.5 kg)  12/24/20 220 lb 9.6 oz (100.1 kg)    Physical Exam Vitals reviewed.  HENT:     Nose: Nose normal.     Mouth/Throat:     Mouth: Mucous membranes are moist.  Eyes:     General: No scleral icterus.    Conjunctiva/sclera: Conjunctivae normal.  Cardiovascular:     Rate and Rhythm: Normal rate. Rhythm irregularly irregular.     Heart sounds: Normal heart sounds, S1 normal and S2 normal.     No gallop.  Pulmonary:     Breath sounds: No stridor. No wheezing, rhonchi or rales.  Abdominal:     General: Abdomen is flat.     Palpations: There is no mass.     Tenderness: There is no abdominal tenderness. There is no guarding.     Hernia: No hernia is present.  Musculoskeletal:     Cervical back: Neck supple.     Right lower leg: 1+ Pitting Edema present.  Left lower leg: 1+ Pitting Edema present.  Lymphadenopathy:     Cervical: No cervical adenopathy.  Skin:    General: Skin is warm and dry.  Neurological:     General: No focal deficit present.     Mental Status: He is alert.     Lab Results  Component Value Date   WBC 7.0 02/10/2022   HGB 15.1 02/10/2022   HCT 45.6 02/10/2022   PLT 160.0 02/10/2022   GLUCOSE 114 (H) 02/10/2022   CHOL 195 02/10/2022   TRIG 63.0 02/10/2022   HDL 37.20 (L) 02/10/2022   LDLCALC 145 (H) 02/10/2022   ALT 19 02/10/2022   AST 25 02/10/2022   NA 136 02/10/2022   K 3.6 02/10/2022   CL 102 02/10/2022   CREATININE 0.90 02/10/2022   BUN 13 02/10/2022   CO2 26 02/10/2022   TSH 1.83  02/10/2022   PSA 0.67 05/28/2019   INR 1.3 (H) 12/19/2020   HGBA1C 6.9 (H) 02/10/2022   MICROALBUR 1.6 02/10/2022    MR BRAIN WO CONTRAST  Result Date: 12/20/2020 CLINICAL DATA:  Neuro deficit, acute stroke suspected EXAM: MRI HEAD WITHOUT CONTRAST TECHNIQUE: Multiplanar, multiecho pulse sequences of the brain and surrounding structures were obtained without intravenous contrast. COMPARISON:  December 19, 2020 CT head. 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 Slight edema in the left cerebellum without significant mass effect. Areas of apparent restricted diffusion along the paramidline high frontal lobes are likely artifactual, related to falcine calcification. Moderate scattered T2/FLAIR hyperintensities within the white matter, likely related to chronic microvascular ischemic disease. Mild atrophy with ex vacuo ventricular dilation. No hydrocephalus. No evidence of acute hemorrhage, extra-axial fluid collection, mass lesion or midline shift. Vascular: Major arterial flow voids are maintained at the skull base. Small left vertebral artery, further evaluated on recent CTA. Skull and upper cervical spine: Normal marrow signal. Sinuses/Orbits: Mild ethmoid air cell mucosal thickening. Unremarkable orbits. Other: No mastoid effusions. IMPRESSION: 1. 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 Slight edema in the left cerebellum without mass effect. 2. Moderate chronic microvascular ischemic disease and mild atrophy. Electronically Signed   By: Margaretha Sheffield MD   On: 12/20/2020 12:18   ECHOCARDIOGRAM COMPLETE  Result Date: 12/19/2020    ECHOCARDIOGRAM REPORT   Patient Name:   DRYDEN TAPLEY Victorio Date of Exam: 12/19/2020 Medical Rec #:  [id number]        Height:       73.0 in Accession #:     [id number]       Weight:       234.0 lb Date of Birth:  August 10, 1940        BSA:          2.300 m Patient Age:    89 years         BP:           165/85 mmHg Patient Gender: M                HR:           96 bpm. Exam Location:  Inpatient Procedure: 2D Echo, Cardiac Doppler, Color Doppler and Intracardiac            Opacification Agent Indications:    Stroke  History:        Patient  has prior history of Echocardiogram examinations, most                 recent 03/12/2012. Arrythmias:Atrial Fibrillation; Risk                 Factors:Dyslipidemia and Hypertension. S/P ablation. Hx NICM.  Sonographer:    Clayton Lefort RDCS (AE) Referring Phys: [id number] [place]  1. Left ventricular ejection fraction, by estimation, is 60 to 65%. The left ventricle has normal function. The left ventricle has no regional wall motion abnormalities. There is mild concentric left ventricular hypertrophy. Left ventricular diastolic function could not be evaluated.  2. Right ventricular systolic function is mildly reduced. The right ventricular size is mildly enlarged.  3. Left atrial size was mildly dilated.  4. Right atrial size was mildly dilated.  5. The mitral valve is grossly normal. Mild mitral valve regurgitation. No evidence of mitral stenosis.  6. The aortic valve is tricuspid. There is mild calcification of the aortic valve. Aortic valve regurgitation is not visualized. Mild aortic valve sclerosis is present, with no evidence of aortic valve stenosis. Conclusion(s)/Recommendation(s): No intracardiac source of embolism detected on this transthoracic study. A transesophageal echocardiogram is recommended to exclude cardiac source of embolism if clinically indicated. FINDINGS  Left Ventricle: Left ventricular ejection fraction, by estimation, is 60 to 65%. The left ventricle has normal function. The left ventricle has no regional wall motion abnormalities. Definity contrast agent was given IV to delineate the left  ventricular  endocardial borders. The left ventricular internal cavity size was normal in size. There is mild concentric left ventricular hypertrophy. Left ventricular diastolic function could not be evaluated due to atrial fibrillation. Left ventricular diastolic function could not be evaluated. Right Ventricle: The right ventricular size is mildly enlarged. No increase in right ventricular wall thickness. Right ventricular systolic function is mildly reduced. Left Atrium: Left atrial size was mildly dilated. Right Atrium: Right atrial size was mildly dilated. Pericardium: There is no evidence of pericardial effusion. Mitral Valve: The mitral valve is grossly normal. Mild mitral valve regurgitation. No evidence of mitral valve stenosis. Tricuspid Valve: The tricuspid valve is grossly normal. Tricuspid valve regurgitation is mild . No evidence of tricuspid stenosis. Aortic Valve: The aortic valve is tricuspid. There is mild calcification of the aortic valve. Aortic valve regurgitation is not visualized. Mild aortic valve sclerosis is present, with no evidence of aortic valve stenosis. Aortic valve mean gradient measures 4.6 mmHg. Aortic valve peak gradient measures 7.4 mmHg. Aortic valve area, by VTI measures 2.27 cm. Pulmonic Valve: The pulmonic valve was grossly normal. Pulmonic valve regurgitation is not visualized. No evidence of pulmonic stenosis. Aorta: The aortic root and ascending aorta are structurally normal, with no evidence of dilitation. Venous: The inferior vena cava was not well visualized. IAS/Shunts: The atrial septum is grossly normal.  LEFT VENTRICLE PLAX 2D LVIDd:         5.20 cm LVIDs:         3.20 cm LV PW:         1.20 cm LV IVS:        1.20 cm LVOT diam:     2.10 cm LV SV:         58 LV SV Index:   25 LVOT Area:     3.46 cm  RIGHT VENTRICLE RV Basal diam:  4.10 cm RV Mid diam:    3.70 cm RV S prime:     10.40 cm/s TAPSE (M-mode): 1.6 cm  LEFT ATRIUM              Index       RIGHT ATRIUM            Index LA diam:        4.60 cm  2.00 cm/m  RA Area:     37.80 cm LA Vol (A2C):   84.6 ml  36.78 ml/m RA Volume:   142.00 ml 61.74 ml/m LA Vol (A4C):   102.3 ml 44.45 ml/m LA Biplane Vol: 94.3 ml  41.00 ml/m  AORTIC VALVE AV Area (Vmax):    2.28 cm AV Area (Vmean):   2.18 cm AV Area (VTI):     2.27 cm AV Vmax:           136.40 cm/s AV Vmean:          101.100 cm/s AV VTI:            0.257 m AV Peak Grad:      7.4 mmHg AV Mean Grad:      4.6 mmHg LVOT Vmax:         89.60 cm/s LVOT Vmean:        63.680 cm/s LVOT VTI:          0.168 m LVOT/AV VTI ratio: 0.65  AORTA Ao Root diam: 3.80 cm Ao Asc diam:  3.60 cm MR Peak grad: 104.9 mmHg  TRICUSPID VALVE MR Mean grad: 74.0 mmHg   TR Peak grad:   32.0 mmHg MR Vmax:      512.00 cm/s TR Vmax:        283.00 cm/s MR Vmean:     411.0 cm/s                           SHUNTS                           Systemic VTI:  0.17 m                           Systemic Diam: 2.10 cm Eleonore Chiquito MD Electronically signed by Eleonore Chiquito MD Signature Date/Time: 12/19/2020/4:39:55 PM    Final    CT CHEST W CONTRAST  Result Date: 12/19/2020 CLINICAL DATA:  Nausea/vomiting, diarrhea.  Stroke. EXAM: CT CHEST, ABDOMEN, AND PELVIS WITH CONTRAST TECHNIQUE: Multidetector CT imaging of the chest, abdomen and pelvis was performed following the standard protocol during bolus administration of intravenous contrast. CONTRAST:  12m OMNIPAQUE IOHEXOL 350 MG/ML SOLN COMPARISON:  None. FINDINGS: CT CHEST FINDINGS Cardiovascular: Enlarged bilateral atria. No significant pericardial effusion. The thoracic aorta is normal in caliber. Mild atherosclerotic plaque of the thoracic aorta. Four-vessel coronary artery calcifications. Mediastinum/Nodes: No enlarged mediastinal, hilar, or axillary lymph nodes. Thyroid gland, trachea, and esophagus demonstrate no significant findings. Lungs/Pleura: No focal consolidation. No pulmonary nodule. No pulmonary mass. No pleural effusion or pneumothorax.  Musculoskeletal: No chest wall abnormality. No suspicious lytic or blastic osseous lesions. No acute displaced fracture. Multilevel degenerative changes of the spine. CT ABDOMEN PELVIS FINDINGS Hepatobiliary: No focal liver abnormality. No gallstones, gallbladder wall thickening, or pericholecystic fluid. No biliary dilatation. Pancreas: No focal lesion. Normal pancreatic contour. No surrounding inflammatory changes. No main pancreatic ductal dilatation. Spleen: Normal in size without focal abnormality. Adrenals/Urinary Tract: No adrenal nodule bilaterally. The right kidney enhances homogeneous Lea. The left kidney demonstrates both superior and inferior pole  hypodense wedge-shaped densities. Subcentimeter hypodensities are too small to characterize. No hydronephrosis. No hydroureter. The urinary bladder is unremarkable. On delayed imaging, there is no urothelial wall thickening and there are no filling defects in the opacified portions of the bilateral collecting systems or ureters. Stomach/Bowel: Stomach is within normal limits. No evidence of bowel wall thickening or dilatation. Appendix appears normal. Vascular/Lymphatic: At least moderate to severe calcified and noncalcified atherosclerotic plaque. No abdominal aorta. Aneurysmal dilatation of the proximal left common iliac artery measuring up to 1.7 cm extending for approximately 2 cm in the craniocaudal dimension with associated large volume eccentric noncalcified plaque. Mild atherosclerotic plaque of the aorta and its branches. No abdominal, pelvic, or inguinal lymphadenopathy. Reproductive: Prostate is unremarkable. Other: No intraperitoneal free fluid. No intraperitoneal free gas. No organized fluid collection. Musculoskeletal: No abdominal wall hernia or abnormality. Cortical thickening and diffuse sclerotic appearance of the pelvis, right greater than left. No acute displaced fracture. Multilevel degenerative changes of the spine. IMPRESSION: 1. 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 2. 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 3. Aortic Atherosclerosis (ICD10-I70.0) including four-vessel coronary artery calcifications. 4. Cardiomegaly. 5. Diffuse sclerotic pelvic appearance suggestive of Paget's disease. Recommend correlation with prior x-ray cross-sectional imaging of the pelvis. Electronically Signed   By: Iven Finn M.D.   On: 12/19/2020 15:32   CT ABDOMEN PELVIS W CONTRAST  Result Date: 12/19/2020 CLINICAL DATA:  Nausea/vomiting, diarrhea.  Stroke. EXAM: CT CHEST, ABDOMEN, AND PELVIS WITH CONTRAST TECHNIQUE: Multidetector CT imaging of the chest, abdomen and pelvis was performed following the standard protocol during bolus administration of intravenous contrast. CONTRAST:  4m OMNIPAQUE IOHEXOL 350 MG/ML SOLN COMPARISON:  None. FINDINGS: CT CHEST FINDINGS Cardiovascular: Enlarged bilateral atria. No significant pericardial effusion. The thoracic aorta is normal in caliber. Mild atherosclerotic plaque of the thoracic aorta. Four-vessel coronary artery calcifications. Mediastinum/Nodes: No enlarged mediastinal, hilar, or axillary lymph nodes. Thyroid gland, trachea, and esophagus demonstrate no significant findings. Lungs/Pleura: No focal consolidation. No pulmonary nodule. No pulmonary mass. No pleural effusion or pneumothorax. Musculoskeletal: No chest wall abnormality. No suspicious lytic or blastic osseous lesions. No acute displaced fracture. Multilevel degenerative changes of the spine. CT ABDOMEN PELVIS FINDINGS Hepatobiliary: No focal liver abnormality. No gallstones, gallbladder wall thickening, or pericholecystic fluid. No biliary dilatation. Pancreas: No focal lesion. Normal pancreatic contour. No surrounding inflammatory changes. No main pancreatic ductal dilatation. Spleen: Normal in size without focal abnormality. Adrenals/Urinary  Tract: No adrenal nodule bilaterally. The right kidney enhances homogeneous Lea. The left kidney demonstrates both superior and inferior pole hypodense wedge-shaped densities. Subcentimeter hypodensities are too small to characterize. No hydronephrosis. No hydroureter. The urinary bladder is unremarkable. On delayed imaging, there is no urothelial wall thickening and there are no filling defects in the opacified portions of the bilateral collecting systems or ureters. Stomach/Bowel: Stomach is within normal limits. No evidence of bowel wall thickening or dilatation. Appendix appears normal. Vascular/Lymphatic: At least moderate to severe calcified and noncalcified atherosclerotic plaque. No abdominal aorta. Aneurysmal dilatation of the proximal left common iliac artery measuring up to 1.7 cm extending for approximately 2 cm in the craniocaudal dimension with associated large volume eccentric noncalcified plaque. Mild atherosclerotic plaque of the aorta and its branches. No abdominal, pelvic, or inguinal lymphadenopathy. Reproductive: Prostate is unremarkable. Other: No intraperitoneal free fluid. No intraperitoneal free gas. No organized fluid collection. Musculoskeletal: No abdominal wall hernia or abnormality. Cortical thickening and diffuse sclerotic appearance  of the pelvis, right greater than left. No acute displaced fracture. Multilevel degenerative changes of the spine. IMPRESSION: 1. 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 2. 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 3. Aortic Atherosclerosis (ICD10-I70.0) including four-vessel coronary artery calcifications. 4. Cardiomegaly. 5. Diffuse sclerotic pelvic appearance suggestive of Paget's disease. Recommend correlation with prior x-ray cross-sectional imaging of the pelvis. Electronically Signed   By: Iven Finn M.D.   On: 12/19/2020 15:32   CT HEAD WO  CONTRAST  Result Date: 12/19/2020 CLINICAL DATA:  Stroke follow-up, nausea vomiting post tPA. Worsened facial droop. EXAM: CT HEAD WITHOUT CONTRAST TECHNIQUE: Contiguous axial images were obtained from the base of the skull through the vertex without intravenous contrast. COMPARISON:  CT angiography head 12/19/2020, CT head 12/19/2020 1:35 p.m. 5 FINDINGS: Brain: Cerebral ventricle sizes are concordant with the degree of cerebral volume loss. No evidence of large-territorial acute infarction. No parenchymal hemorrhage. No mass lesion. No extra-axial collection. No mass effect or midline shift. No hydrocephalus. Basilar cisterns are patent. Vascular: No hyperdense vessel. Atherosclerotic calcifications are present within the cavernous internal carotid arteries. Skull: No acute fracture or focal lesion. Sinuses/Orbits: Paranasal sinuses and mastoid air cells are clear. The orbits are unremarkable. Other: None. IMPRESSION: No acute intracranial abnormality. Electronically Signed   By: Iven Finn M.D.   On: 12/19/2020 15:05   CT ANGIO HEAD CODE STROKE  Result Date: 12/19/2020 CLINICAL DATA:  Code stroke. Right-sided facial droop, numbness and slurred speech. EXAM: CT ANGIOGRAPHY HEAD AND NECK TECHNIQUE: Multidetector CT imaging of the head and neck was performed using the standard protocol during bolus administration of intravenous contrast. Multiplanar CT image reconstructions and MIPs were obtained to evaluate the vascular anatomy. Carotid stenosis measurements (when applicable) are obtained utilizing NASCET criteria, using the distal internal carotid diameter as the denominator. CONTRAST:  50m OMNIPAQUE IOHEXOL 350 MG/ML SOLN COMPARISON:  Same day code stroke CT head FINDINGS: CTA NECK FINDINGS Aortic arch: Great vessel origins are patent. Calcific and noncalcific atherosclerosis of the visualized aortic arch. Right carotid system: No evidence of dissection, stenosis (50% or greater) or occlusion.Mild  calcific and noncalcific atherosclerosis at the carotid bifurcation. Left carotid system: No evidence of dissection, stenosis (50% or greater) or occlusion. Mild calcific and noncalcific atherosclerosis at the carotid bifurcation. 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 Skeleton: Moderate multilevel cervical degenerative disease with disc height loss, endplate sclerosis and endplate spurring. Other neck: No acute abnormality. Upper chest: Visualized lung apices are clear. Review of the MIP images confirms the above findings CTA HEAD FINDINGS Anterior circulation: No definite large vessel occlusion or proximal hemodynamically significant stenosis. Hypoplastic or absent right A1 ACA, likely congenital. The right A2 ACA appears to arise from the left A1 ACA. No aneurysm identified. Posterior circulation: Diminutive opacification of a small intradural left vertebral artery. Right intradural vertebral artery is patent. Basilar artery is small with bilateral fetal type PCAs, anatomic variant. Bilateral PCAs are patent proximally without proximal flow limiting stenosis. The distal PCAs are poorly characterized due to venous contamination. No aneurysm identified. Venous sinuses: As permitted by contrast timing, patent. Anatomic variants: See above. Review of the MIP images confirms the above findings IMPRESSION: CTA head: 1. No emergent large vessel occlusion  or proximal hemodynamically significant stenosis. 2. Limited evaluation of the distal vasculature due to venous contamination. 3. Small vertebrobasilar system with bilateral fetal type PCAs, anatomic variant. CTA Neck: 1. 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 2. 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 3. Mild carotid bifurcation atherosclerosis without significant (greater than 50%) carotid stenosis in the neck. Findings discussed with Dr. Lorrin Goodell via telephone 1:29 p.m. Electronically Signed   By: Margaretha Sheffield MD   On: 12/19/2020 13:39   CT ANGIO NECK CODE STROKE  Result Date: 12/19/2020 CLINICAL DATA:  Code stroke. Right-sided facial droop, numbness and slurred speech. EXAM: CT ANGIOGRAPHY HEAD AND NECK TECHNIQUE: Multidetector CT imaging of the head and neck was performed using the standard protocol during bolus administration of intravenous contrast. Multiplanar CT image reconstructions and MIPs were obtained to evaluate the vascular anatomy. Carotid stenosis measurements (when applicable) are obtained utilizing NASCET criteria, using the distal internal carotid diameter as the denominator. CONTRAST:  41m OMNIPAQUE IOHEXOL 350 MG/ML SOLN COMPARISON:  Same day code stroke CT head FINDINGS: CTA NECK FINDINGS Aortic arch: Great vessel origins are patent. Calcific and noncalcific atherosclerosis of the visualized aortic arch. Right carotid system: No evidence of dissection, stenosis (50% or greater) or occlusion.Mild calcific and noncalcific atherosclerosis at the carotid bifurcation. Left carotid system: No evidence of dissection, stenosis (50% or greater) or occlusion. Mild calcific and noncalcific atherosclerosis at the carotid bifurcation. 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 Skeleton: Moderate multilevel cervical degenerative disease with disc height loss, endplate sclerosis and endplate spurring.  Other neck: No acute abnormality. Upper chest: Visualized lung apices are clear. Review of the MIP images confirms the above findings CTA HEAD FINDINGS Anterior circulation: No definite large vessel occlusion or proximal hemodynamically significant stenosis. Hypoplastic or absent right A1 ACA, likely congenital. The right A2 ACA appears to arise from the left A1 ACA. No aneurysm identified. Posterior circulation: Diminutive opacification of a small intradural left vertebral artery. Right intradural vertebral artery is patent. Basilar artery is small with bilateral fetal type PCAs, anatomic variant. Bilateral PCAs are patent proximally without proximal flow limiting stenosis. The distal PCAs are poorly characterized due to venous contamination. No aneurysm identified. Venous sinuses: As permitted by contrast timing, patent. Anatomic variants: See above. Review of the MIP images confirms the above findings IMPRESSION: CTA head: 1. No emergent large vessel occlusion or proximal hemodynamically significant stenosis. 2. Limited evaluation of the distal vasculature due to venous contamination. 3. Small vertebrobasilar system with bilateral fetal type PCAs, anatomic variant. CTA Neck: 1. 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 2. 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 3. Mild carotid bifurcation atherosclerosis without significant (greater than 50%) carotid stenosis in the neck. Findings discussed with Dr. KLorrin Goodellvia telephone 1:29 p.m. Electronically Signed   By: FMargaretha SheffieldMD   On: 12/19/2020 13:39   CT HEAD CODE STROKE WO CONTRAST  Result Date: 12/19/2020 CLINICAL DATA:  Code stroke.  Neuro deficit, acute stroke suspected. EXAM: CT HEAD WITHOUT CONTRAST TECHNIQUE: Contiguous axial images were obtained  from the base of the skull through the vertex without intravenous contrast. COMPARISON:  None. FINDINGS: Brain: No evidence of acute large vascular territory infarction, acute hemorrhage, hydrocephalus, extra-axial collection or mass lesion/mass effect. Patchy white matter hypoattenuation, most likely related to moderate chronic microvascular ischemic disease. Ossification along the falx. Vascular: No hyperdense vessel identified. Calcific intracranial atherosclerosis. Skull: No acute fracture. Sinuses/Orbits: Visualized sinuses are clear. No acute orbital abnormality. Other: No mastoid effusions. ASPECTS [facility] Stroke Program Early CT Score) Total score (0-10 with 10 being normal): 10. IMPRESSION: 1. No evidence of acute large vascular territory infarct or acute hemorrhage. ASPECTS is 10. 2. Moderate chronic microvascular ischemic disease. Code stroke imaging results were communicated on 12/19/2020 at 12:28 pm to provider [place] via telephone, who verbally acknowledged these results. Electronically Signed   By: Margaretha Sheffield MD   On: 12/19/2020 12:35    Assessment & Plan:   Carrell was seen today for annual exam, hypertension, diabetes, atrial fibrillation and hyperlipidemia.  Diagnoses and all orders for this visit:  Chronic atrial fibrillation ([place])- He has good rate control. -     TSH; Future -     TSH -     rivaroxaban (XARELTO) 20 MG TABS tablet; TAKE 1 TABLET(20 MG) BY MOUTH DAILY WITH SUPPER -     diltiazem (CARDIZEM CD) 240 MG 24 hr capsule; TAKE 1 CAPSULE(240 MG) BY MOUTH DAILY  Essential hypertension- His blood pressure is adequately well controlled. -     Basic metabolic panel; Future -     TSH; Future -     Urinalysis, Routine w reflex microscopic; Future -     CBC with Differential/Platelet; Future -     CBC with Differential/Platelet -     Urinalysis, Routine w reflex microscopic -     TSH -     Basic metabolic panel -     diltiazem (CARDIZEM CD) 240 MG 24 hr capsule;  TAKE 1 CAPSULE(240 MG) BY MOUTH DAILY  Type 2 diabetes mellitus with complication, without long-term current use of insulin ([place])- His blood sugar is adequately well controlled. -     Basic metabolic panel; Future -     Microalbumin / creatinine urine ratio; Future -     Hemoglobin A1c; Future -     HM Diabetes Foot Exam -     Hemoglobin A1c -     Microalbumin / creatinine urine ratio -     Basic metabolic panel -     empagliflozin (JARDIANCE) 25 MG TABS tablet; Take 1 tablet (25 mg total) by mouth daily before breakfast.  Routine general medical examination at a health care facility- Exam completed, labs reviewed, vaccines reviewed and updated, no cancer screenings indicated, patient education was given.  Hyperlipidemia with target LDL less than 100- He has not achieved his LDL goal.  I have asked him to restart the statin. -     Lipid panel; Future -     TSH; Future -     Hepatic function panel; Future -     Hepatic function panel -     TSH -     Lipid panel -     atorvastatin (LIPITOR) 40 MG tablet; Take 1 tablet (40 mg total) by mouth daily.  Need for prophylactic vaccination with combined diphtheria-tetanus-pertussis (DTP) vaccine -     Zoster Vaccine Adjuvanted [facility]) injection; Inject 0.5 mLs into the  muscle once for 1 dose.  Need for vaccination -     Pneumococcal polysaccharide vaccine 23-valent greater than or equal to 2yo subcutaneous/IM  Persistent atrial fibrillation (HCC)  Cardiomyopathy, nonischemic (HCC) -     empagliflozin (JARDIANCE) 25 MG TABS tablet; Take 1 tablet (25 mg total) by mouth daily before breakfast. -     diltiazem (CARDIZEM CD) 240 MG 24 hr capsule; TAKE 1 CAPSULE(240 MG) BY MOUTH DAILY  Tachycardia   I have discontinued Sayvion H. Poplin's [place]. I am also having him start on Shingrix and empagliflozin. Additionally, I am having him maintain his B Complete, metoprolol tartrate, latanoprost, sildenafil, atorvastatin, rivaroxaban, and  diltiazem.  Meds ordered this encounter  Medications   Zoster Vaccine Adjuvanted [facility]) injection    Sig: Inject 0.5 mLs into the muscle once for 1 dose.    Dispense:  0.5 mL    Refill:  1   atorvastatin (LIPITOR) 40 MG tablet    Sig: Take 1 tablet (40 mg total) by mouth daily.    Dispense:  90 tablet    Refill:  1   rivaroxaban (XARELTO) 20 MG TABS tablet    Sig: TAKE 1 TABLET(20 MG) BY MOUTH DAILY WITH SUPPER    Dispense:  90 tablet    Refill:  1   empagliflozin (JARDIANCE) 25 MG TABS tablet    Sig: Take 1 tablet (25 mg total) by mouth daily before breakfast.    Dispense:  90 tablet    Refill:  1   diltiazem (CARDIZEM CD) 240 MG 24 hr capsule    Sig: TAKE 1 CAPSULE(240 MG) BY MOUTH DAILY    Dispense:  90 capsule    Refill:  1     Follow-up: Return in about 6 months (around 08/13/2022).  Scarlette Calico, MD

## 2022-02-10 NOTE — Patient Instructions (Signed)
Health Maintenance, Male Adopting a healthy lifestyle and getting preventive care are important in promoting health and wellness. Ask your health care provider about: The right schedule for you to have regular tests and exams. Things you can do on your own to prevent diseases and keep yourself healthy. What should I know about diet, weight, and exercise? Eat a healthy diet  Eat a diet that includes plenty of vegetables, fruits, low-fat dairy products, and lean protein. Do not eat a lot of foods that are high in solid fats, added sugars, or sodium. Maintain a healthy weight Body mass index (BMI) is a measurement that can be used to identify possible weight problems. It estimates body fat based on height and weight. Your health care provider can help determine your BMI and help you achieve or maintain a healthy weight. Get regular exercise Get regular exercise. This is one of the most important things you can do for your health. Most adults should: Exercise for at least 150 minutes each week. The exercise should increase your heart rate and make you sweat (moderate-intensity exercise). Do strengthening exercises at least twice a week. This is in addition to the moderate-intensity exercise. Spend less time sitting. Even light physical activity can be beneficial. Watch cholesterol and blood lipids Have your blood tested for lipids and cholesterol at 79 years of age, then have this test every 5 years. You may need to have your cholesterol levels checked more often if: Your lipid or cholesterol levels are high. You are older than 79 years of age. You are at high risk for heart disease. What should I know about cancer screening? Many types of cancers can be detected early and may often be prevented. Depending on your health history and family history, you may need to have cancer screening at various ages. This may include screening for: Colorectal cancer. Prostate cancer. Skin cancer. Lung  cancer. What should I know about heart disease, diabetes, and high blood pressure? Blood pressure and heart disease High blood pressure causes heart disease and increases the risk of stroke. This is more likely to develop in people who have high blood pressure readings or are overweight. Talk with your health care provider about your target blood pressure readings. Have your blood pressure checked: Every 3-5 years if you are 18-39 years of age. Every year if you are 40 years old or older. If you are between the ages of 65 and 75 and are a current or former smoker, ask your health care provider if you should have a one-time screening for abdominal aortic aneurysm (AAA). Diabetes Have regular diabetes screenings. This checks your fasting blood sugar level. Have the screening done: Once every three years after age 45 if you are at a normal weight and have a low risk for diabetes. More often and at a younger age if you are overweight or have a high risk for diabetes. What should I know about preventing infection? Hepatitis B If you have a higher risk for hepatitis B, you should be screened for this virus. Talk with your health care provider to find out if you are at risk for hepatitis B infection. Hepatitis C Blood testing is recommended for: Everyone born from 1945 through 1965. Anyone with known risk factors for hepatitis C. Sexually transmitted infections (STIs) You should be screened each year for STIs, including gonorrhea and chlamydia, if: You are sexually active and are younger than 79 years of age. You are older than 79 years of age and your   health care provider tells you that you are at risk for this type of infection. Your sexual activity has changed since you were last screened, and you are at increased risk for chlamydia or gonorrhea. Ask your health care provider if you are at risk. Ask your health care provider about whether you are at high risk for HIV. Your health care provider  may recommend a prescription medicine to help prevent HIV infection. If you choose to take medicine to prevent HIV, you should first get tested for HIV. You should then be tested every 3 months for as long as you are taking the medicine. Follow these instructions at home: Alcohol use Do not drink alcohol if your health care provider tells you not to drink. If you drink alcohol: Limit how much you have to 0-2 drinks a day. Know how much alcohol is in your drink. In the [place] one drink equals one 12 oz bottle of beer (355 mL), one 5 oz glass of wine (148 mL), or one 1 oz glass of hard liquor (44 mL). Lifestyle Do not use any products that contain nicotine or tobacco. These products include cigarettes, chewing tobacco, and vaping devices, such as e-cigarettes. If you need help quitting, ask your health care provider. Do not use street drugs. Do not share needles. Ask your health care provider for help if you need support or information about quitting drugs. General instructions Schedule regular health, dental, and eye exams. Stay current with your vaccines. Tell your health care provider if: You often feel depressed. You have ever been abused or do not feel safe at home. Summary Adopting a healthy lifestyle and getting preventive care are important in promoting health and wellness. Follow your health care provider's instructions about healthy diet, exercising, and getting tested or screened for diseases. Follow your health care provider's instructions on monitoring your cholesterol and blood pressure. This information is not intended to replace advice given to you by your health care provider. Make sure you discuss any questions you have with your health care provider. Document Revised: 11/02/2020 Document Reviewed: 11/02/2020 Elsevier Patient Education  2023 Elsevier Inc.  

## 2022-02-11 ENCOUNTER — Encounter: Payer: Self-pay | Admitting: Internal Medicine

## 2022-02-17 DIAGNOSIS — Z1152 Encounter for screening for COVID-19: Secondary | ICD-10-CM | POA: Diagnosis not present

## 2022-02-24 DIAGNOSIS — Z1152 Encounter for screening for COVID-19: Secondary | ICD-10-CM | POA: Diagnosis not present

## 2022-03-13 ENCOUNTER — Other Ambulatory Visit: Payer: Self-pay | Admitting: Internal Medicine

## 2022-03-13 DIAGNOSIS — I482 Chronic atrial fibrillation, unspecified: Secondary | ICD-10-CM

## 2022-03-13 DIAGNOSIS — I10 Essential (primary) hypertension: Secondary | ICD-10-CM

## 2022-03-13 DIAGNOSIS — I428 Other cardiomyopathies: Secondary | ICD-10-CM

## 2022-03-17 DIAGNOSIS — Z1152 Encounter for screening for COVID-19: Secondary | ICD-10-CM | POA: Diagnosis not present

## 2022-03-18 DIAGNOSIS — Z1152 Encounter for screening for COVID-19: Secondary | ICD-10-CM | POA: Diagnosis not present

## 2022-03-24 DIAGNOSIS — Z1152 Encounter for screening for COVID-19: Secondary | ICD-10-CM | POA: Diagnosis not present

## 2022-03-31 DIAGNOSIS — Z1152 Encounter for screening for COVID-19: Secondary | ICD-10-CM | POA: Diagnosis not present

## 2022-04-07 DIAGNOSIS — Z1152 Encounter for screening for COVID-19: Secondary | ICD-10-CM | POA: Diagnosis not present

## 2022-04-14 DIAGNOSIS — Z1152 Encounter for screening for COVID-19: Secondary | ICD-10-CM | POA: Diagnosis not present

## 2022-04-20 DIAGNOSIS — Z1152 Encounter for screening for COVID-19: Secondary | ICD-10-CM | POA: Diagnosis not present

## 2022-04-27 DIAGNOSIS — Z1152 Encounter for screening for COVID-19: Secondary | ICD-10-CM | POA: Diagnosis not present

## 2022-05-03 DIAGNOSIS — H3589 Other specified retinal disorders: Secondary | ICD-10-CM | POA: Diagnosis not present

## 2022-05-03 DIAGNOSIS — H401132 Primary open-angle glaucoma, bilateral, moderate stage: Secondary | ICD-10-CM | POA: Diagnosis not present

## 2022-05-04 DIAGNOSIS — Z1152 Encounter for screening for COVID-19: Secondary | ICD-10-CM | POA: Diagnosis not present

## 2022-05-13 DIAGNOSIS — Z1152 Encounter for screening for COVID-19: Secondary | ICD-10-CM | POA: Diagnosis not present

## 2022-05-22 NOTE — Progress Notes (Signed)
Cardiology Office Note   Date:  05/25/2022   ID:  Edward Pittman, DOB [birth year], MRN [id number]  PCP:  [name], MD  Cardiologist:   Minus Breeding, MD    Chief Complaint  Patient presents with   Atrial Fibrillation      History of Present Illness: Edward Pittman is a 81 y.o. male who presents for follow up of atrial fib.   He had atrial fib and had ablation but had recurrent fib.   He had a CVA in 2022.  Since I last saw him he has done well.  He still works [employer].  The patient denies any new symptoms such as chest discomfort, neck or arm discomfort. There has been no new shortness of breath, PND or orthopnea. There have been no reported palpitations, presyncope or syncope.    Past Medical History:  Diagnosis Date   Chronic systolic dysfunction of left ventricle    Dyslipidemia    HTN (hypertension)    Hx of adenomatous colonic polyps    Hx of colonoscopy    Keloid    Mild mitral regurgitation by prior echocardiogram    Nonischemic cardiomyopathy ([place])    Persistent atrial fibrillation [facility])    s/p ablation 4/16  Dr. Rayann Heman    Past Surgical History:  Procedure Laterality Date   ATRIAL FIBRILLATION ABLATION N/A 10/11/2011   Procedure: ATRIAL FIBRILLATION ABLATION;  Surgeon: Thompson Grayer, MD;  Location: [facility] CATH LAB;  Service: Cardiovascular;  Laterality: N/A;   COLONOSCOPY     FEMORAL HERNIA REPAIR     at age 75   HERNIA REPAIR     TEE WITHOUT CARDIOVERSION  10/10/2011   Procedure: TRANSESOPHAGEAL ECHOCARDIOGRAM (TEE);  Surgeon: Fay Records, MD;  Location: [facility] ENDOSCOPY;  Service: Cardiovascular;  Laterality: N/A;     Current Outpatient Medications  Medication Sig Dispense Refill   atorvastatin (LIPITOR) 40 MG tablet Take 1 tablet (40 mg total) by mouth daily. 90 tablet 1   B Complex-Biotin-FA (B COMPLETE) TABS Take 1 tablet by mouth daily with breakfast.     diltiazem (CARDIZEM CD) 240 MG 24 hr capsule TAKE 1 CAPSULE(240 MG) BY MOUTH  DAILY 90 capsule 1   empagliflozin (JARDIANCE) 25 MG TABS tablet Take 1 tablet (25 mg total) by mouth daily before breakfast. 90 tablet 1   latanoprost (XALATAN) 0.005 % ophthalmic solution      metoprolol tartrate (LOPRESSOR) 100 MG tablet TAKE 1 TABLET(100 MG) BY MOUTH TWICE DAILY 180 tablet 3   rivaroxaban (XARELTO) 20 MG TABS tablet TAKE 1 TABLET(20 MG) BY MOUTH DAILY WITH SUPPER 90 tablet 1   sildenafil (REVATIO) 20 MG tablet Take 1 tablet (20 mg total) by mouth as needed. 20 tablet 3   No current facility-administered medications for this visit.    Allergies:   Benazepril and Metformin and related     ROS:  Please see the history of present illness.   Otherwise, review of systems are positive for ED.   All other systems are reviewed and negative.    PHYSICAL EXAM: VS:  BP 132/82   Pulse (!) 124   Ht '6\' 1"'$  (1.854 m)   Wt 240 lb 6.4 oz (109 kg)   SpO2 96%   BMI 31.72 kg/m  , BMI Body mass index is 31.72 kg/m. GENERAL:  Well appearing NECK:  No jugular venous distention, waveform within normal limits, carotid upstroke brisk and symmetric, no bruits, no thyromegaly LUNGS:  Clear to auscultation bilaterally CHEST:  Unremarkable HEART:  PMI not displaced or sustained,S1 and S2 within normal limits, no S3, no clicks, no rubs, very soft apical brief systolic murmur radiating at the aortic outflow tract, no diastolic murmurs, irregular ABD:  Flat, positive bowel sounds normal in frequency in pitch, no bruits, no rebound, no guarding, no midline pulsatile mass, no hepatomegaly, no splenomegaly EXT:  2 plus pulses throughout, no edema, no cyanosis no clubbing   EKG:  EKG is  ordered today. Atrial fibrillation, rate 124, right axis deviation, no acute ST-T wave changes.   Recent Labs: 02/10/2022: ALT 19; BUN 13; Creatinine, Ser 0.90; Hemoglobin 15.1; Platelets 160.0; Potassium 3.6; Sodium 136; TSH 1.83    Lipid Panel    Component Value Date/Time   CHOL 195 02/10/2022 1154    CHOL 118 04/01/2021 0916   TRIG 63.0 02/10/2022 1154   HDL 37.20 (L) 02/10/2022 1154   HDL 38 (L) 04/01/2021 0916   CHOLHDL 5 02/10/2022 1154   VLDL 12.6 02/10/2022 1154   LDLCALC 145 (H) 02/10/2022 1154   LDLCALC 68 04/01/2021 0916      Wt Readings from Last 3 Encounters:  05/25/22 240 lb 6.4 oz (109 kg)  02/10/22 239 lb (108.4 kg)  05/31/21 226 lb (102.5 kg)      Other studies Reviewed: Additional studies/ records that were reviewed today include: Labs Review of the above records demonstrates: See elsewhere   ASSESSMENT AND PLAN:   ATRIAL FIB:    [name] has a CHA2DS2 - VASc score of 5.    His rate is elevated.  He is going to get a pulse oximeter.  If his heart rate is running above 100 I will adjust his Cardizem.  HTN:   The blood pressure is at target.  No change in therapy.   DYSLIPIDEMIA:   LDL was 145 and HDL 37.  He says he just started his Lipitor.   CVA:   He had no residual from this.  No change in therapy.  current medicines are reviewed at length with the patient today.  The patient does not have concerns regarding medicines.  The following changes have been made: None  Labs/ tests ordered today include:   None   Orders Placed This Encounter  Procedures   EKG 12-Lead     Disposition:   FU with me in me in 12 months.    Signed, Minus Breeding, MD  05/25/2022 8:07 AM    [facility]

## 2022-05-25 ENCOUNTER — Encounter: Payer: Self-pay | Admitting: Cardiology

## 2022-05-25 ENCOUNTER — Ambulatory Visit: Payer: Medicare PPO | Attending: Cardiology | Admitting: Cardiology

## 2022-05-25 VITALS — BP 132/82 | HR 124 | Ht 73.0 in | Wt 240.4 lb

## 2022-05-25 DIAGNOSIS — I10 Essential (primary) hypertension: Secondary | ICD-10-CM | POA: Diagnosis not present

## 2022-05-25 DIAGNOSIS — E785 Hyperlipidemia, unspecified: Secondary | ICD-10-CM | POA: Diagnosis not present

## 2022-05-25 DIAGNOSIS — I4819 Other persistent atrial fibrillation: Secondary | ICD-10-CM | POA: Diagnosis not present

## 2022-05-25 NOTE — Patient Instructions (Signed)
    Follow-Up: At [facility], you and your health needs are our priority.  As part of our continuing mission to provide you with exceptional heart care, we have created designated Provider Care Teams.  These Care Teams include your primary Cardiologist (physician) and Advanced Practice Providers (APPs -  Physician Assistants and Nurse Practitioners) who all work together to provide you with the care you need, when you need it.  We recommend signing up for the patient portal called "MyChart".  Sign up information is provided on this After Visit Summary.  MyChart is used to connect with patients for Virtual Visits (Telemedicine).  Patients are able to view lab/test results, encounter notes, upcoming appointments, etc.  Non-urgent messages can be sent to your provider as well.   To learn more about what you can do with MyChart, go to [email or web address].    Your next appointment:   6 month(s)  The format for your next appointment:   In Person  Provider:   Minus Breeding, MD

## 2022-05-26 ENCOUNTER — Ambulatory Visit: Payer: Medicare PPO | Admitting: Cardiology

## 2022-05-27 DIAGNOSIS — Z1152 Encounter for screening for COVID-19: Secondary | ICD-10-CM | POA: Diagnosis not present

## 2022-06-06 DIAGNOSIS — Z1152 Encounter for screening for COVID-19: Secondary | ICD-10-CM | POA: Diagnosis not present

## 2022-06-09 DIAGNOSIS — Z1152 Encounter for screening for COVID-19: Secondary | ICD-10-CM | POA: Diagnosis not present

## 2022-08-19 IMAGING — CT CT HEAD W/O CM
4 series · 15 of 47 positions shown, 17 images · non-contrast
Comparison: CT angiography head 12/19/2020, CT head 12/19/2020 [DATE]
p.m. 5

CLINICAL DATA: Stroke follow-up, nausea vomiting post tPA. Worsened
facial droop.

EXAM:
CT HEAD WITHOUT CONTRAST
TECHNIQUE: Contiguous axial images were obtained from the base of the skull
through the vertex without intravenous contrast.

[Series 3: head without · axial · non-contrast · 0.43mm/px · z∈[-98,+22]mm · 7 of 33 slices shown, 9 images]
[im 5/33  brain]
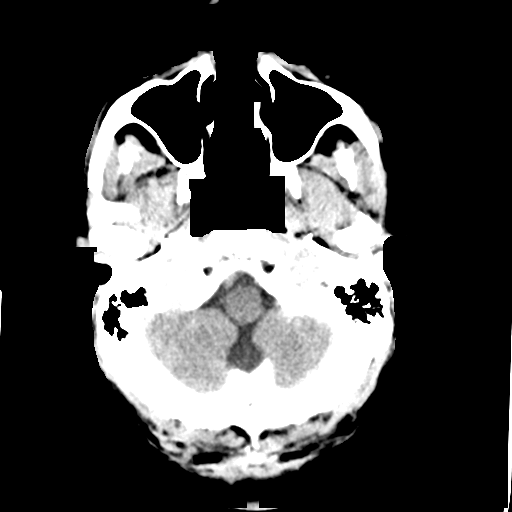
[im 5/33  bone]
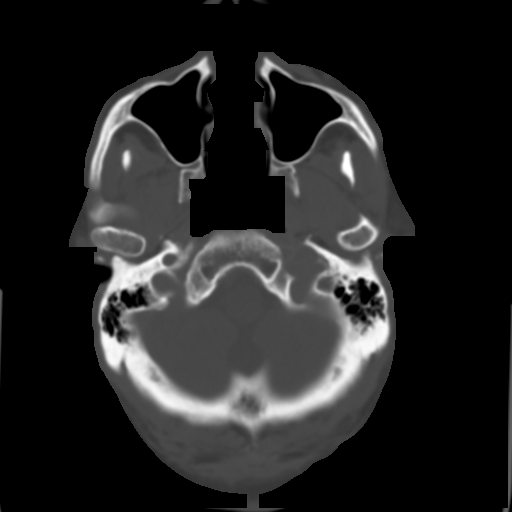
[im 9/33  brain]
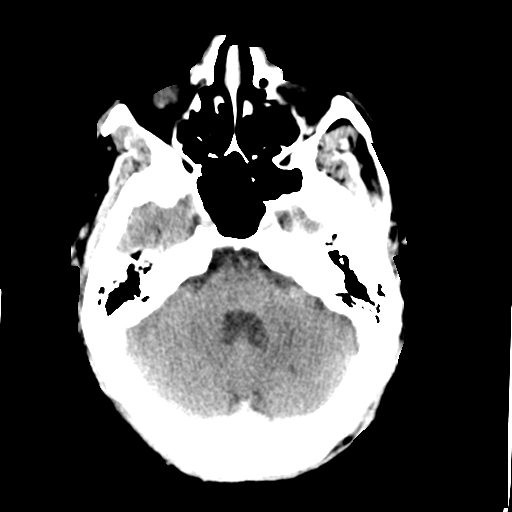
[im 13/33  brain]
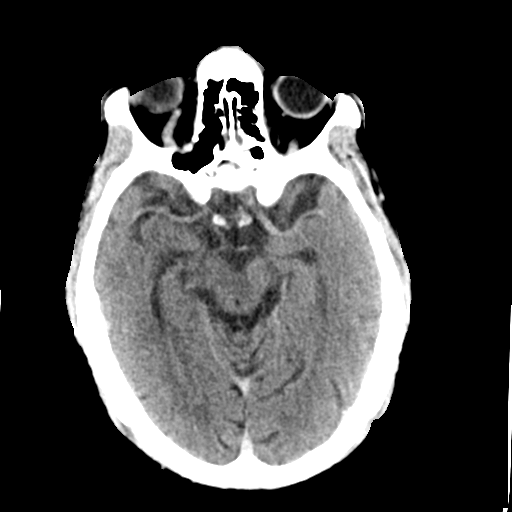
[im 17/33  brain]
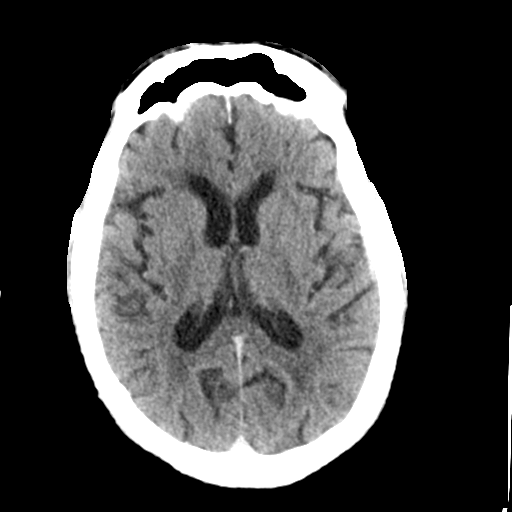
[im 21/33  brain]
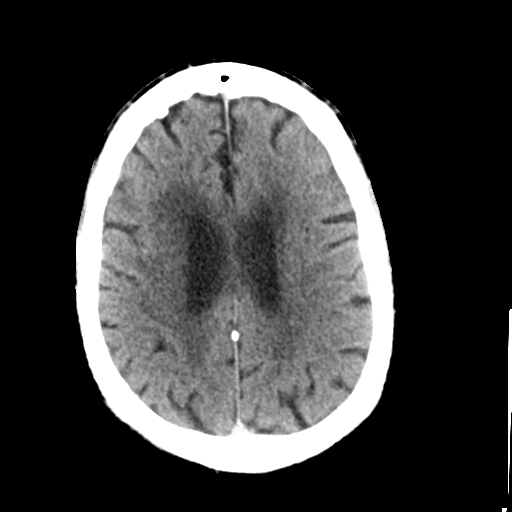
[im 21/33  bone]
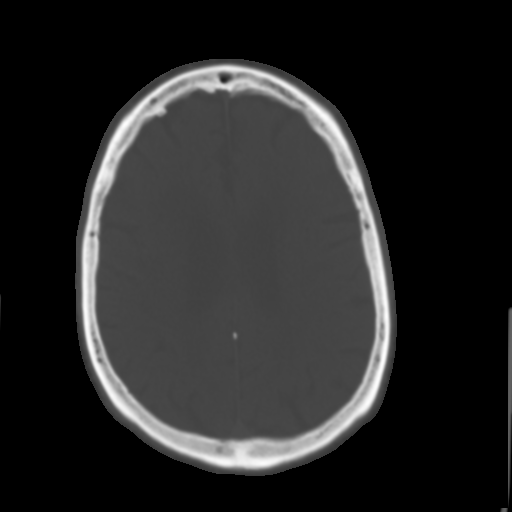
[im 25/33  brain]
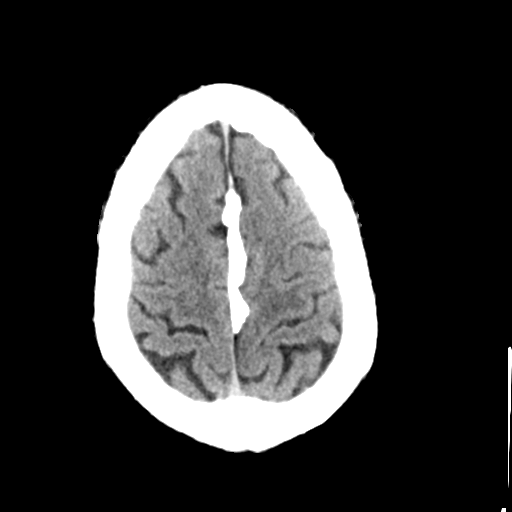
[im 29/33  brain]
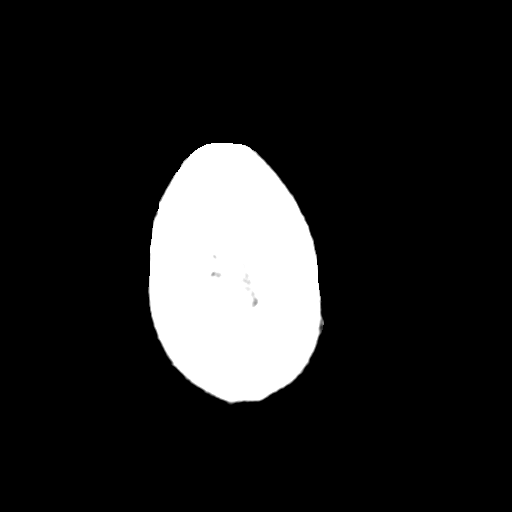

[Series 4: head bone · axial · 0.43mm/px · z∈[-102,-86]mm · 2 of 81 slices shown]
[im 9/81  bone]
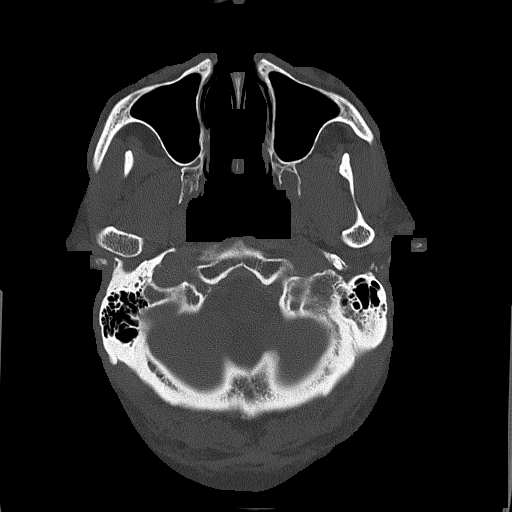
[im 17/81  bone]
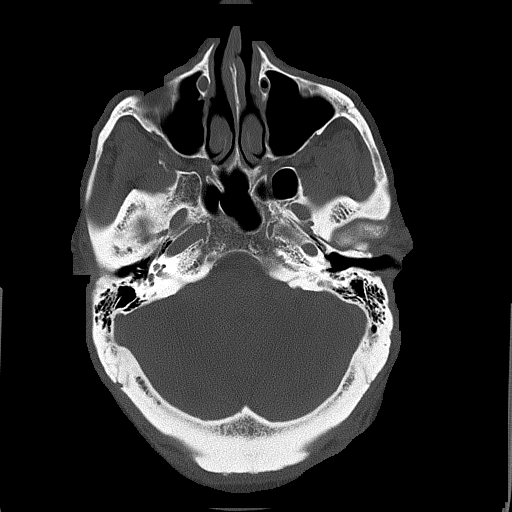

[Series 5: head without cor · coronal · non-contrast · 0.31mm/px · 3 of 74 slices shown]
[im 26/74  brain]
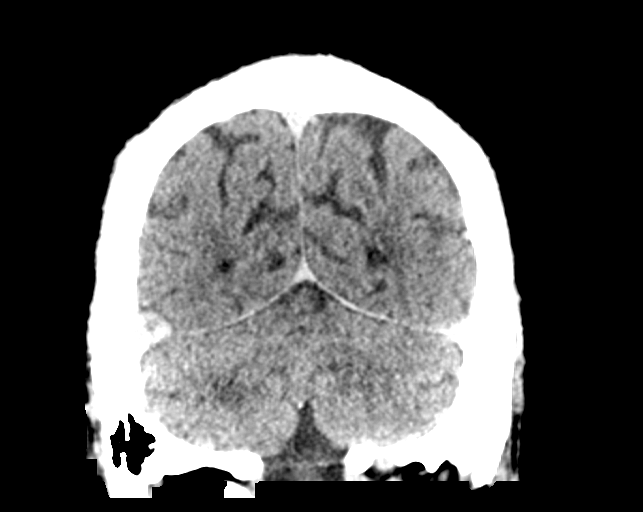
[im 33/74  brain]
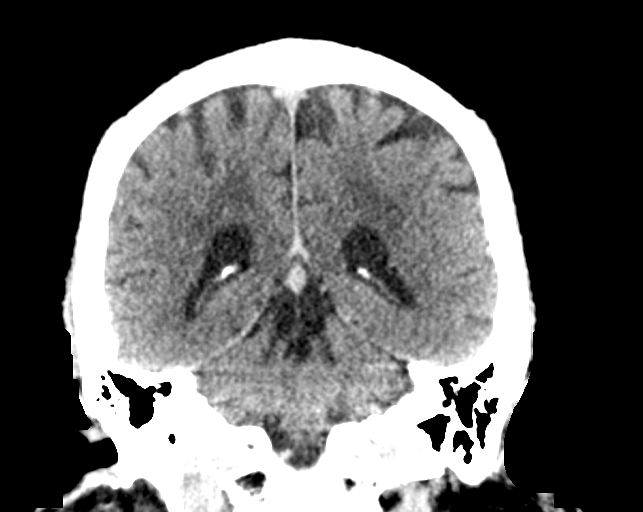
[im 41/74  brain]
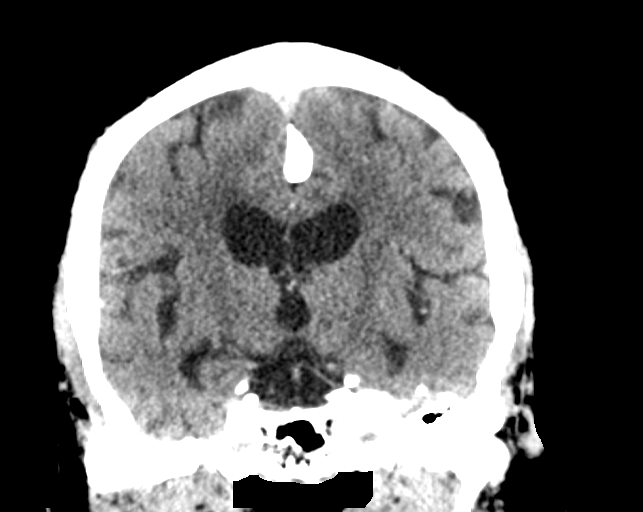

[Series 6: head without sag · sagittal · non-contrast · 0.31mm/px · 3 of 67 slices shown]
[im 23/67  brain]
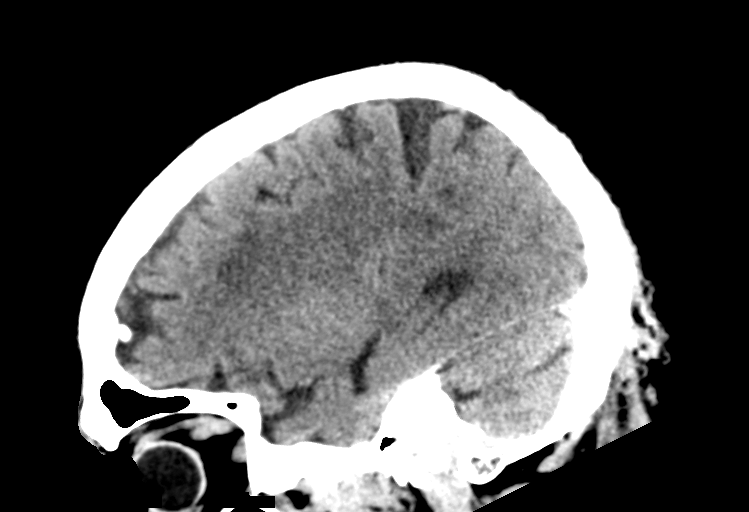
[im 34/67  brain]
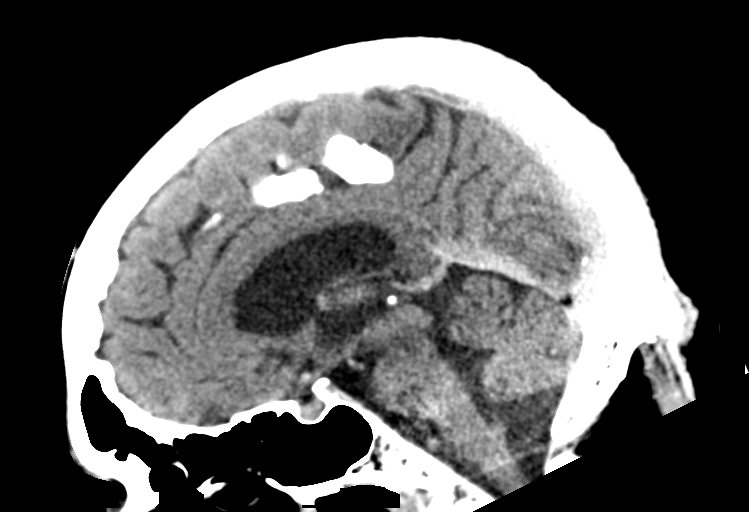
[im 45/67  brain]
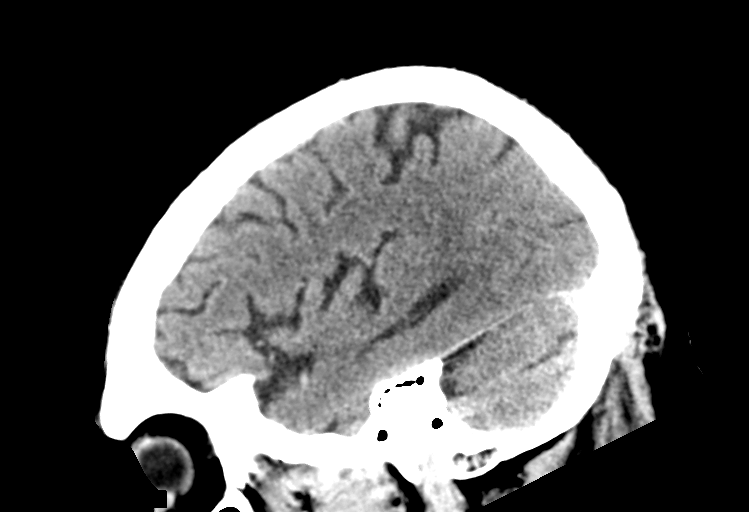

[15 of 47 positions shown; findings below may reference images not displayed]

FINDINGS: Brain:

Cerebral ventricle sizes are concordant with the degree of cerebral
volume loss.

No evidence of large-territorial acute infarction. No parenchymal
hemorrhage. No mass lesion. No extra-axial collection.

No mass effect or midline shift. No hydrocephalus. Basilar cisterns
are patent.

Vascular: No hyperdense vessel. Atherosclerotic calcifications are
present within the cavernous internal carotid arteries.

Skull: No acute fracture or focal lesion.

Sinuses/Orbits: Paranasal sinuses and mastoid air cells are clear.
The orbits are unremarkable.

Other: None.
IMPRESSION: No acute intracranial abnormality.

## 2022-08-19 IMAGING — CT CT HEAD CODE STROKE
3 series · 15 of 47 positions shown, 18 images · non-contrast
Comparison: None.

CLINICAL DATA: Code stroke.  Neuro deficit, acute stroke suspected.

EXAM:
CT HEAD WITHOUT CONTRAST
TECHNIQUE: Contiguous axial images were obtained from the base of the skull
through the vertex without intravenous contrast.

[Series 3: head 5.0 h30s · axial · 0.47mm/px · z∈[-80,+65]mm · 9 of 35 slices shown, 12 images]
[im 3/35  brain]
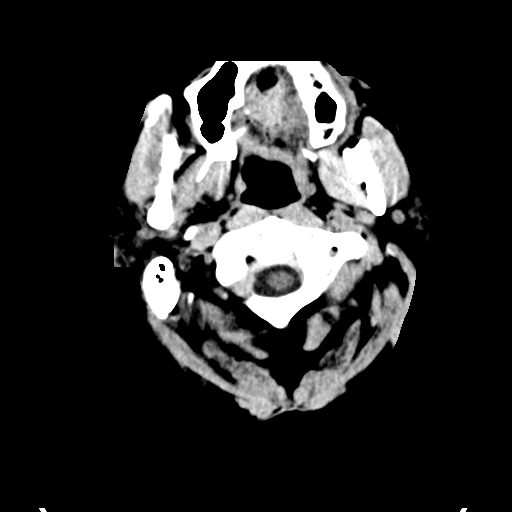
[im 3/35  bone]
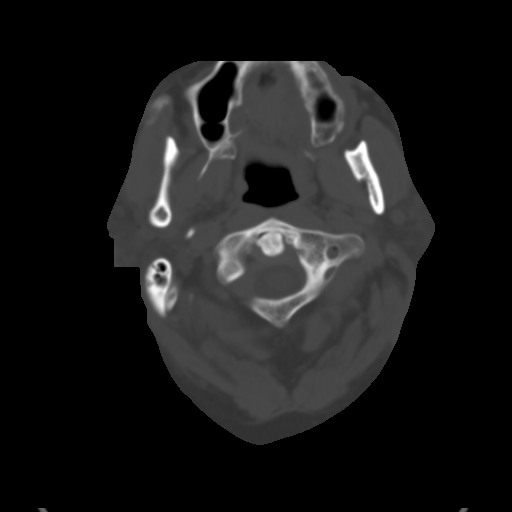
[im 6/35  brain]
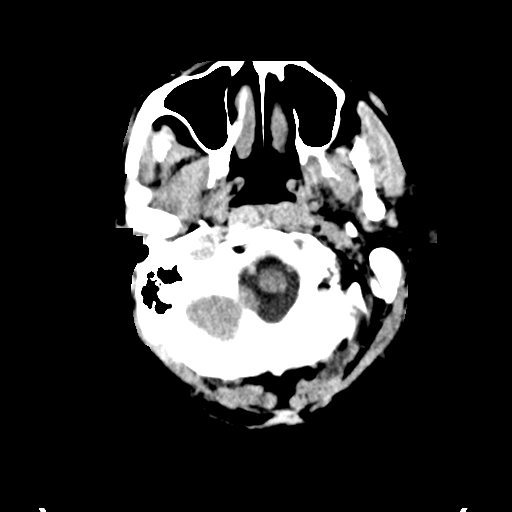
[im 10/35  brain]
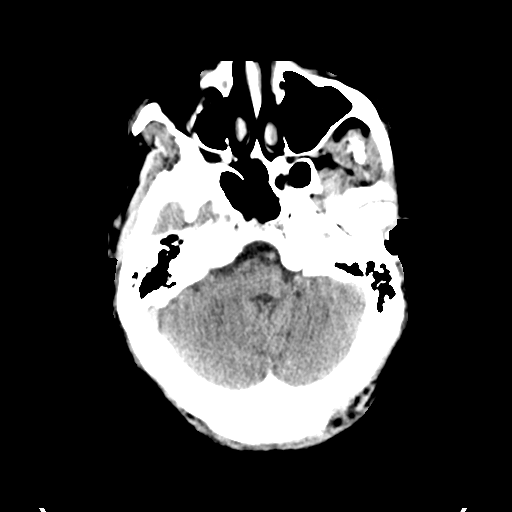
[im 13/35  brain]
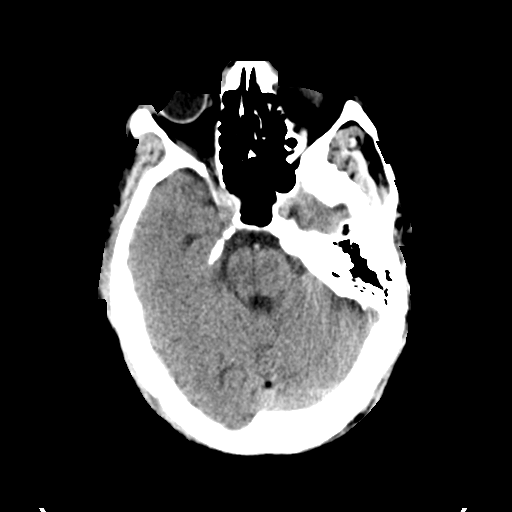
[im 18/35  brain]
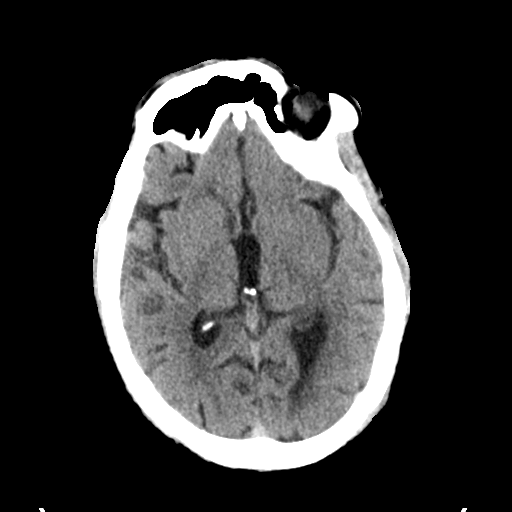
[im 18/35  bone]
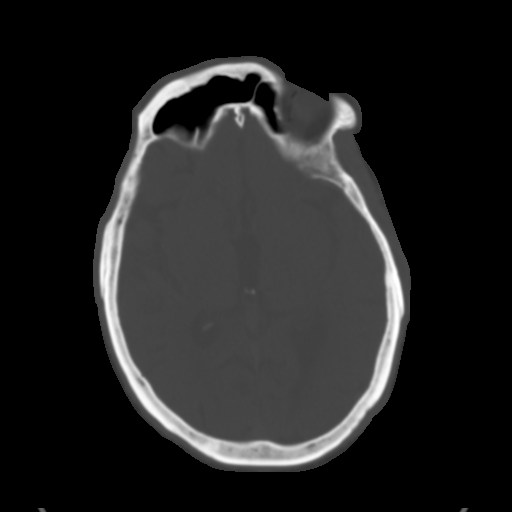
[im 22/35  brain]
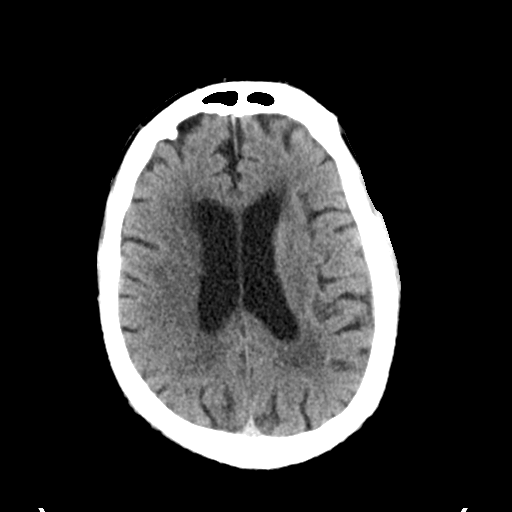
[im 25/35  brain]
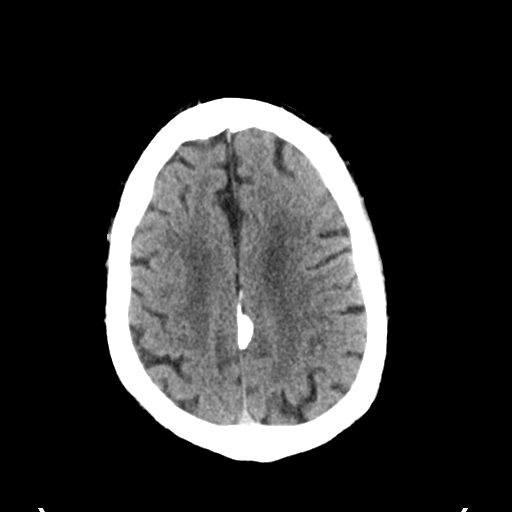
[im 29/35  brain]
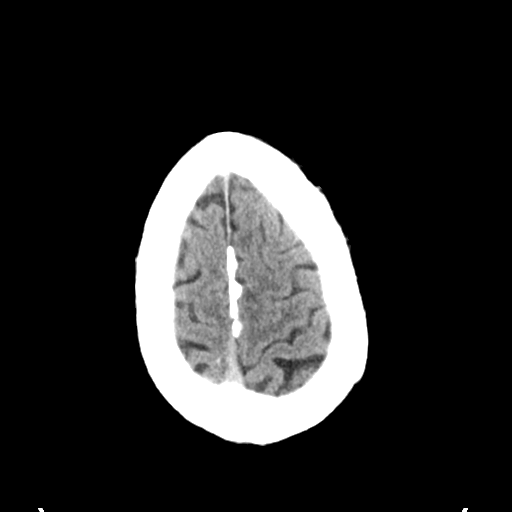
[im 32/35  brain]
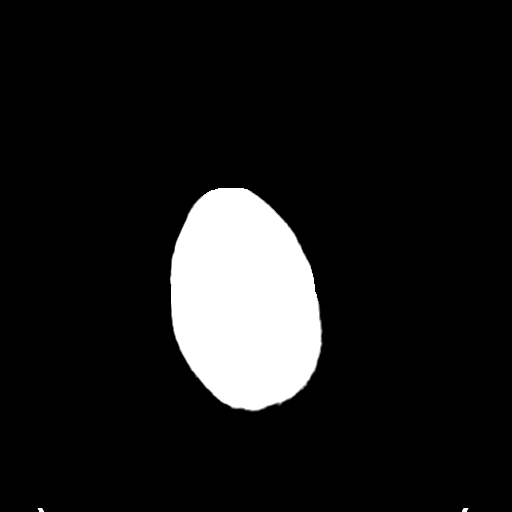
[im 32/35  bone]
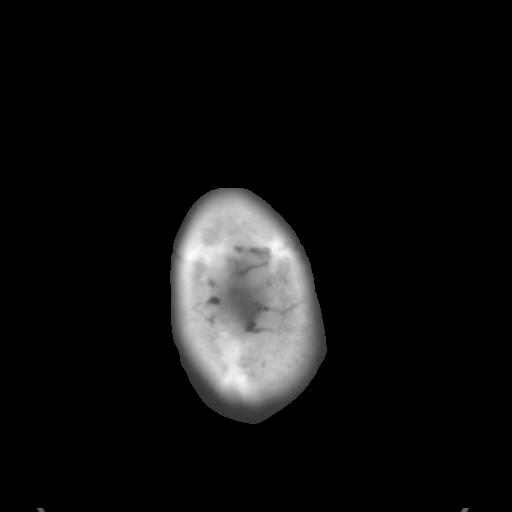

[Series 5: head 3.0 mpr cor · coronal · 0.35mm/px · 3 of 77 slices shown]
[im 26/77  brain]
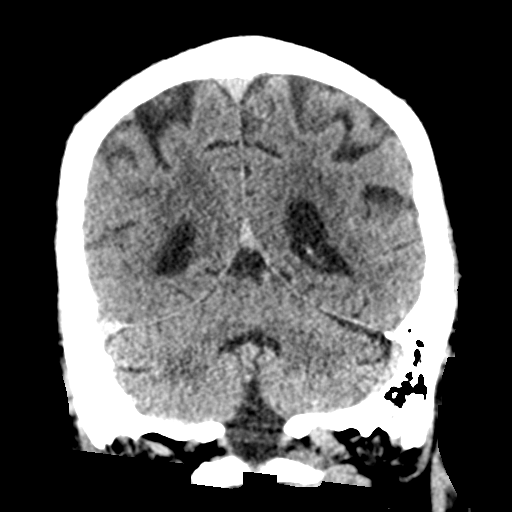
[im 34/77  brain]
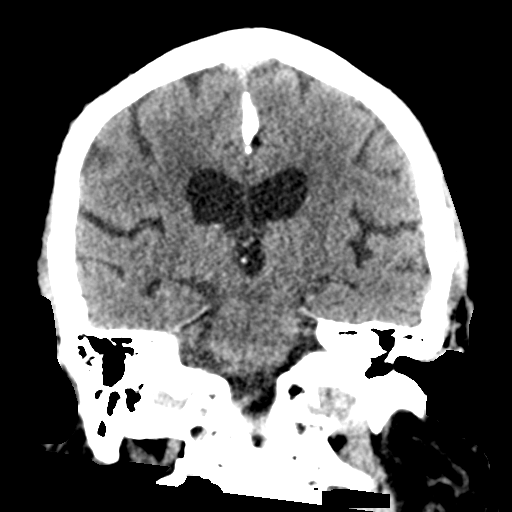
[im 43/77  brain]
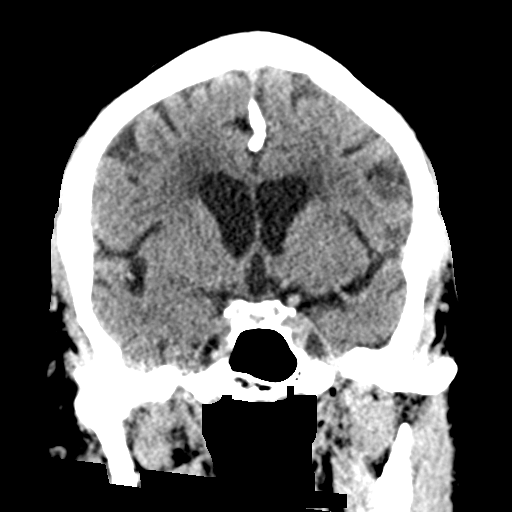

[Series 6: head 3.0 mpr sag · sagittal · 0.34mm/px · 3 of 59 slices shown]
[im 22/59  brain]
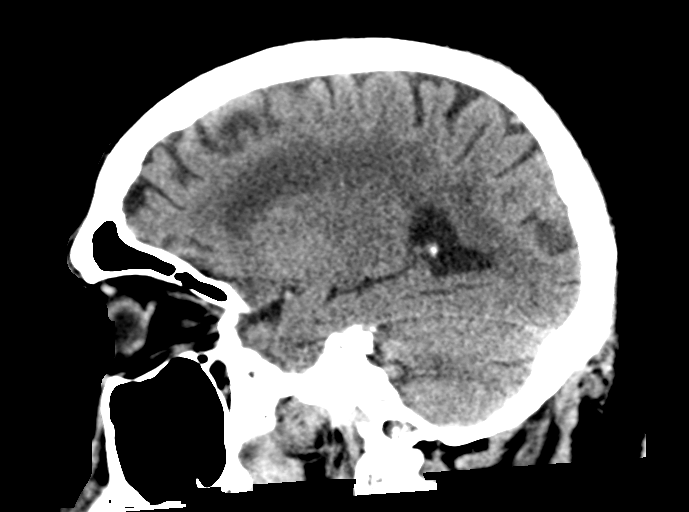
[im 30/59  brain]
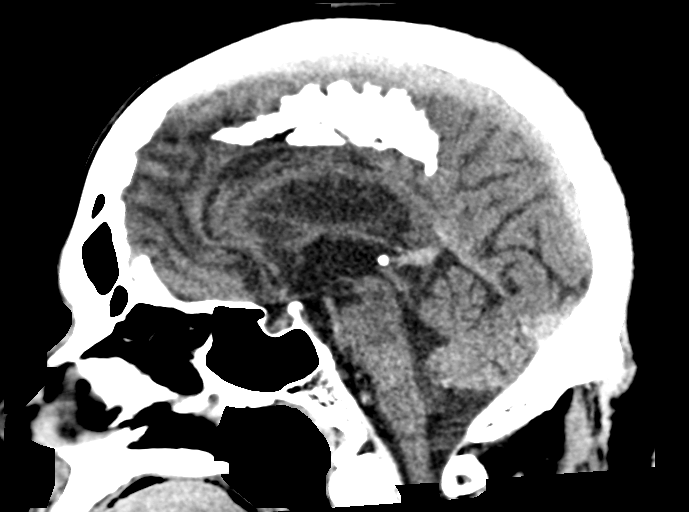
[im 37/59  brain]
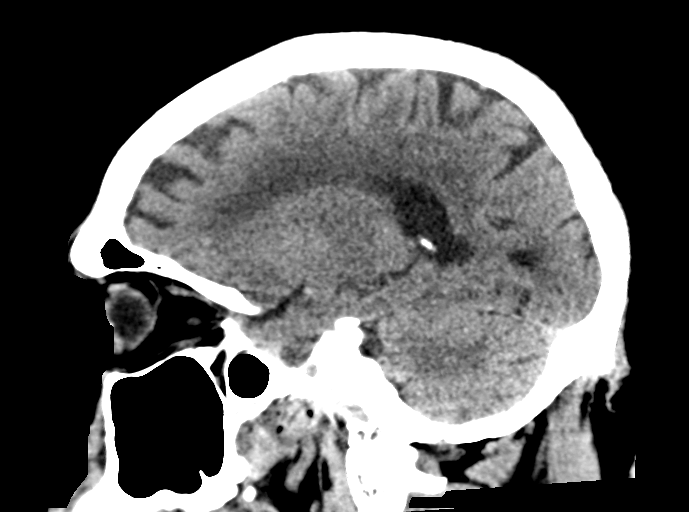

[15 of 47 positions shown; findings below may reference images not displayed]

FINDINGS: Brain: No evidence of acute large vascular territory infarction,
acute hemorrhage, hydrocephalus, extra-axial collection or mass
lesion/mass effect. Patchy white matter hypoattenuation, most likely
related to moderate chronic microvascular ischemic disease.
Ossification along the falx.

Vascular: No hyperdense vessel identified. Calcific intracranial
atherosclerosis.

Skull: No acute fracture.

Sinuses/Orbits: Visualized sinuses are clear. No acute orbital
abnormality.

Other: No mastoid effusions.

ASPECTS ([place] Stroke Program Early CT Score) Total score (0-10
with 10 being normal): 10.
IMPRESSION: 1. No evidence of acute large vascular territory infarct or acute
hemorrhage. ASPECTS is 10.
2. Moderate chronic microvascular ischemic disease.

pm to provider [place] via telephone, who verbally acknowledged
these results.

## 2022-08-25 ENCOUNTER — Other Ambulatory Visit: Payer: Self-pay | Admitting: Internal Medicine

## 2022-08-25 DIAGNOSIS — E785 Hyperlipidemia, unspecified: Secondary | ICD-10-CM

## 2022-10-09 ENCOUNTER — Other Ambulatory Visit: Payer: Self-pay | Admitting: Internal Medicine

## 2022-10-09 DIAGNOSIS — I482 Chronic atrial fibrillation, unspecified: Secondary | ICD-10-CM

## 2022-11-01 DIAGNOSIS — H401132 Primary open-angle glaucoma, bilateral, moderate stage: Secondary | ICD-10-CM | POA: Diagnosis not present

## 2022-11-24 ENCOUNTER — Ambulatory Visit: Payer: Medicare PPO | Admitting: Cardiology

## 2022-12-22 ENCOUNTER — Ambulatory Visit (INDEPENDENT_AMBULATORY_CARE_PROVIDER_SITE_OTHER): Payer: Medicare PPO

## 2022-12-22 VITALS — Ht 73.0 in | Wt 235.0 lb

## 2022-12-22 DIAGNOSIS — Z0000 Encounter for general adult medical examination without abnormal findings: Secondary | ICD-10-CM | POA: Diagnosis not present

## 2022-12-22 NOTE — Progress Notes (Signed)
Subjective:   Edward Pittman is a 82 y.o. male who presents for Medicare Annual/Subsequent preventive examination.  Visit Complete: Virtual  I connected with  Edward Pittman on 12/22/22 by a audio enabled telemedicine application and verified that I am speaking with the correct person using two identifiers.  Patient Location: Home  Provider Location: Office/Clinic  I discussed the limitations of evaluation and management by telemedicine. The patient expressed understanding and agreed to proceed.  Review of Systems     Cardiac Risk Factors include: advanced age (>54men, >35 women);diabetes mellitus;dyslipidemia;family history of premature cardiovascular disease;hypertension;male gender;obesity (BMI >30kg/m2)     Objective:    Today's Vitals   12/22/22 0848  Weight: 235 lb (106.6 kg)  Height: 6\' 1"  (1.854 m)  PainSc: 0-No pain   Body mass index is 31 kg/m.     12/22/2022    8:50 AM 01/21/2022    1:05 PM 09/09/2021   10:17 AM 03/23/2021   10:34 AM 01/12/2021    2:40 PM 01/11/2021   12:13 PM 12/19/2020    1:39 PM  Advanced Directives  Does Patient Have a Medical Advance Directive? No No No No No No No  Would patient like information on creating a medical advance directive? No - Patient declined No - Patient declined No - Patient declined No - Patient declined No - Patient declined No - Patient declined     Current Medications (verified) Outpatient Encounter Medications as of 12/22/2022  Medication Sig   atorvastatin (LIPITOR) 40 MG tablet TAKE 1 TABLET(40 MG) BY MOUTH DAILY   B Complex-Biotin-FA (B COMPLETE) TABS Take 1 tablet by mouth daily with breakfast.   diltiazem (CARDIZEM CD) 240 MG 24 hr capsule TAKE 1 CAPSULE(240 MG) BY MOUTH DAILY   empagliflozin (JARDIANCE) 25 MG TABS tablet Take 1 tablet (25 mg total) by mouth daily before breakfast.   latanoprost (XALATAN) 0.005 % ophthalmic solution    metoprolol tartrate (LOPRESSOR) 100 MG tablet TAKE 1 TABLET(100 MG) BY  MOUTH TWICE DAILY   sildenafil (REVATIO) 20 MG tablet Take 1 tablet (20 mg total) by mouth as needed.   XARELTO 20 MG TABS tablet TAKE 1 TABLET(20 MG) BY MOUTH DAILY WITH SUPPER   No facility-administered encounter medications on file as of 12/22/2022.    Allergies (verified) Benazepril and Metformin and related   History: Past Medical History:  Diagnosis Date   Chronic systolic dysfunction of left ventricle    Dyslipidemia    HTN (hypertension)    Hx of adenomatous colonic polyps    Hx of colonoscopy    Keloid    Mild mitral regurgitation by prior echocardiogram    Nonischemic cardiomyopathy (HCC)    Persistent atrial fibrillation [facility])    s/p ablation 4/16  Dr. Johney Frame   Past Surgical History:  Procedure Laterality Date   ATRIAL FIBRILLATION ABLATION N/A 10/11/2011   Procedure: ATRIAL FIBRILLATION ABLATION;  Surgeon: Hillis Range, MD;  [facility] LAB;  Service: Cardiovascular;  Laterality: N/A;   COLONOSCOPY     FEMORAL HERNIA REPAIR     at age 26   HERNIA REPAIR     TEE WITHOUT CARDIOVERSION  10/10/2011   Procedure: TRANSESOPHAGEAL ECHOCARDIOGRAM (TEE);  Surgeon: Pricilla Riffle, MD;  Location: [facility] ENDOSCOPY;  Service: Cardiovascular;  Laterality: N/A;   Family History  Problem Relation Age of Onset   Brain cancer Mother    Breast cancer Mother    Diabetes Sister    Breast cancer Sister  Brain cancer Sister    Heart attack Father    Lung cancer Brother    Lung cancer Brother    Colon cancer Other        uncle   Prostate cancer Other        uncle   Diabetes Sister    Heart disease Neg Hx    Esophageal cancer Neg Hx    Rectal cancer Neg Hx    Stomach cancer Neg Hx    Social History   Socioeconomic History   Marital status: Married    Spouse name: Not on file   Number of children: 4   Years of education: Not on file   Highest education level: Not on file  Occupational History   Occupation: Autobody Repairman  Tobacco Use   Smoking status: Former     Types: Cigarettes    Quit date: 08/09/1999    Years since quitting: 23.3   Smokeless tobacco: Never   Tobacco comments:    quit 9 years ago  Vaping Use   Vaping Use: Never used  Substance and Sexual Activity   Alcohol use: No   Drug use: No   Sexual activity: Not Currently  Other Topics Concern   Not on file  Social History Narrative   Married   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      4 kids + grandkids      Former smoker, no current tobacco alcohol or drug use      Social Determinants of Corporate investment banker Strain: Low Risk  (12/22/2022)   Overall Financial Resource Strain (CARDIA)    Difficulty of Paying Living Expenses: Not hard at all  Food Insecurity: No Food Insecurity (12/22/2022)   Hunger Vital Sign    Worried About Running Out of Food in the Last Year: Never true    Ran Out of Food in the Last Year: Never true  Transportation Needs: No Transportation Needs (12/22/2022)   PRAPARE - Administrator, Civil Service (Medical): No    Lack of Transportation (Non-Medical): No  Physical Activity: Inactive (12/22/2022)   Exercise Vital Sign    Days of Exercise per Week: 0 days    Minutes of Exercise per Session: 0 min  Stress: No Stress Concern Present (12/22/2022)   [employer] of Occupational Health - Occupational Stress Questionnaire    Feeling of Stress : Not at all  Social Connections: Socially Integrated (12/22/2022)   Social Connection and Isolation Panel [NHANES]    Frequency of Communication with Friends and Family: More than three times a week    Frequency of Social Gatherings with Friends and Family: More than three times a week    Attends Religious Services: More than 4 times per year    Active Member of [employer] or Organizations: Yes    Attends Engineer, structural: More than 4 times per year    Marital Status: Married    Tobacco Counseling Counseling given: Not Answered Tobacco  comments: quit 9 years ago   Clinical Intake:  Pre-visit preparation completed: Yes  Pain : No/denies pain Pain Score: 0-No pain     BMI - recorded: 31 Nutritional Status: BMI > 30  Obese Nutritional Risks: None Diabetes: Yes CBG done?: No Did pt. bring in CBG monitor from home?: No  How often do you need to have someone help you when you read instructions, pamphlets, or other written materials  from your doctor or pharmacy?: 1 - Never What is the last grade level you completed in school?: 10th grade  Interpreter Needed?: No  Information entered by :: Joy Reiger N. Pryor Guettler, LPN.   Activities of Daily Living    12/22/2022    8:53 AM 01/21/2022    1:12 PM  In your present state of health, do you have any difficulty performing the following activities:  Hearing? 0 0  Vision? 0 0  Difficulty concentrating or making decisions? 0 0  Walking or climbing stairs? 0 0  Dressing or bathing? 0 0  Doing errands, shopping? 0 0  Preparing Food and eating ? N N  Using the Toilet? N N  In the past six months, have you accidently leaked urine? N N  Do you have problems with loss of bowel control? N N  Managing your Medications? N N  Managing your Finances? N N  Housekeeping or managing your Housekeeping? N N    Patient Care Team: Etta Grandchild, MD as PCP - General (Internal Medicine) [name], MD as PCP - Cardiology ([facility] (Ophthalmology) Kathyrn Sheriff, [facility] (Inactive) as Pharmacist (Pharmacist)  Indicate any recent Medical Services you may have received from other than [facility] providers in the past year (date may be approximate).     Assessment:   This is a routine wellness examination for Demetreus.  Hearing/Vision screen Hearing Screening - Comments:: Denies hearing difficulties.  Vision Screening - Comments:: No eyeglasses; has glaucoma - up to date with routine eye exams with [facility]   Dietary issues and exercise  activities discussed:     Goals Addressed             This Visit's Progress    My healthcare goal for 2024 is to start a good exercise program.        Depression Screen    12/22/2022    8:52 AM 01/21/2022    1:09 PM 09/09/2021   10:15 AM 03/23/2021   10:32 AM 01/11/2021   12:07 PM 12/09/2019   10:29 AM 05/28/2019    9:09 AM  PHQ 2/9 Scores  PHQ - 2 Score 0 0 0 0 0 0 0  PHQ- 9 Score 0          Fall Risk    12/22/2022    8:51 AM 01/21/2022    1:06 PM 09/09/2021   10:15 AM 03/23/2021   10:32 AM 01/11/2021   12:04 PM  Fall Risk   Falls in the past year? 0 0 0 0 0  Number falls in past yr: 0 0     Injury with Fall? 0 0     Risk for fall due to : No Fall Risks No Fall Risks     Follow up Falls prevention discussed Falls evaluation completed       MEDICARE RISK AT HOME:  Medicare Risk at Home - 12/22/22 0850     Any stairs in or around the home? No    If so, are there any without handrails? No    Home free of loose throw rugs in walkways, pet beds, electrical cords, etc? Yes    Adequate lighting in your home to reduce risk of falls? Yes    Life alert? No    Use of a cane, walker or w/c? No    Grab bars in the bathroom? No    Shower chair or bench in shower? No    Elevated toilet seat  or a handicapped toilet? Yes             TIMED UP AND GO:  Was the test performed?  No    Cognitive Function:        12/22/2022    8:53 AM 01/21/2022    1:14 PM 12/09/2019   10:31 AM  6CIT Screen  What Year? 0 points 0 points 0 points  What month? 0 points 0 points 0 points  What time? 0 points 0 points 0 points  Count back from 20 0 points 0 points 0 points  Months in reverse 0 points 0 points 0 points  Repeat phrase 0 points 0 points 0 points  Total Score 0 points 0 points 0 points    Immunizations Immunization History  Administered Date(s) Administered   Fluad Quad(high Dose 65+) 05/28/2019   Moderna Sars-Covid-2 Vaccination 07/30/2019, 08/27/2019   Pneumococcal  Conjugate-13 12/12/2013   Pneumococcal Polysaccharide-23 05/11/2016, 02/10/2022   Tdap 12/12/2013    TDAP status: Up to date  Flu Vaccine status: Due, Education has been provided regarding the importance of this vaccine. Advised may receive this vaccine at local pharmacy or Health Dept. Aware to provide a copy of the vaccination record if obtained from local pharmacy or Health Dept. Verbalized acceptance and understanding.  Pneumococcal vaccine status: Up to date  Covid-19 vaccine status: Completed vaccines  Qualifies for Shingles Vaccine? Yes   Zostavax completed No   Shingrix Completed?: No.    Education has been provided regarding the importance of this vaccine. Patient has been advised to call insurance company to determine out of pocket expense if they have not yet received this vaccine. Advised may also receive vaccine at local pharmacy or Health Dept. Verbalized acceptance and understanding.  Screening Tests Health Maintenance  Topic Date Due   Zoster Vaccines- Shingrix (1 of 2) Never done   HEMOGLOBIN A1C  08/13/2022   OPHTHALMOLOGY EXAM  11/10/2022   INFLUENZA VACCINE  01/26/2023   Diabetic kidney evaluation - eGFR measurement  02/11/2023   Diabetic kidney evaluation - Urine ACR  02/11/2023   FOOT EXAM  02/11/2023   DTaP/Tdap/Td (2 - Td or Tdap) 12/13/2023   Medicare Annual Wellness (AWV)  12/22/2023   Pneumonia Vaccine 17+ Years old  Completed   Hepatitis C Screening  Completed   HPV VACCINES  Aged Out   Colonoscopy  Discontinued   COVID-19 Vaccine  Discontinued    Health Maintenance  Health Maintenance Due  Topic Date Due   Zoster Vaccines- Shingrix (1 of 2) Never done   HEMOGLOBIN A1C  08/13/2022   OPHTHALMOLOGY EXAM  11/10/2022    Colorectal cancer screening: No longer required.   Lung Cancer Screening: (Low Dose CT Chest recommended if Age 26-80 years, 20 pack-year currently smoking OR have quit w/in 15years.) does not qualify.   Lung Cancer Screening  Referral: no  Additional Screening:  Hepatitis C Screening: does qualify; Completed 12/12/2013  Vision Screening: Recommended annual ophthalmology exams for early detection of glaucoma and other disorders of the eye. Is the patient up to date with their annual eye exam?  Yes  Who is the provider or what is the name of the office in which the patient attends annual eye exams? [facility] If pt is not established with a provider, would they like to be referred to a provider to establish care? No .   Dental Screening: Recommended annual dental exams for proper oral hygiene  Diabetic Foot Exam: Diabetic Foot Exam: Completed  02/10/2022  Community Resource Referral / Chronic Care Management: CRR required this visit?  No   CCM required this visit?  No     Plan:     I have personally reviewed and noted the following in the patient's chart:   Medical and social history Use of alcohol, tobacco or illicit drugs  Current medications and supplements including opioid prescriptions. Patient is not currently taking opioid prescriptions. Functional ability and status Nutritional status Physical activity Advanced directives List of other physicians Hospitalizations, surgeries, and ER visits in previous 12 months Vitals Screenings to include cognitive, depression, and falls Referrals and appointments  In addition, I have reviewed and discussed with patient certain preventive protocols, quality metrics, and best practice recommendations. A written personalized care plan for preventive services as well as general preventive health recommendations were provided to patient.     Mickeal Needy, LPN   [id number]   After Visit Summary: (Mail) Due to this being a telephonic visit, the after visit summary with patients personalized plan was offered to patient via mail   Nurse Notes: Normal cognitive status assessed by direct observation via telephone conversation by this Nurse Health  Advisor. No abnormalities found.

## 2022-12-22 NOTE — Patient Instructions (Addendum)
Edward Pittman , Thank you for taking time to come for your Medicare Wellness Visit. I appreciate your ongoing commitment to your health goals. Please review the following plan we discussed and let me know if I can assist you in the future.   These are the goals we discussed:  Goals      My healthcare goal for 2024 is to start a good exercise program.        This is a list of the screening recommended for you and due dates:  Health Maintenance  Topic Date Due   Zoster (Shingles) Vaccine (1 of 2) Never done   Hemoglobin A1C  08/13/2022   Eye exam for diabetics  11/10/2022   Flu Shot  01/26/2023   Yearly kidney function blood test for diabetes  02/11/2023   Yearly kidney health urinalysis for diabetes  02/11/2023   Complete foot exam   02/11/2023   DTaP/Tdap/Td vaccine (2 - Td or Tdap) 12/13/2023   Medicare Annual Wellness Visit  12/22/2023   Pneumonia Vaccine  Completed   Hepatitis C Screening  Completed   HPV Vaccine  Aged Out   Colon Cancer Screening  Discontinued   COVID-19 Vaccine  Discontinued    Advanced directives: No  Conditions/risks identified: Yes  Next appointment: It was nice speaking with you today!  Please follow up in one year for your annual wellness visit via telephone call with Nurse Percell Miller on 12/28/2023 at 8:45 a.m.  If you need to cancel or reschedule please call [phone].  Preventive Care 46 Years and Older, Male  Preventive care refers to lifestyle choices and visits with your health care provider that can promote health and wellness. What does preventive care include? A yearly physical exam. This is also called an annual well check. Dental exams once or twice a year. Routine eye exams. Ask your health care provider how often you should have your eyes checked. Personal lifestyle choices, including: Daily care of your teeth and gums. Regular physical activity. Eating a healthy diet. Avoiding tobacco and drug use. Limiting alcohol  use. Practicing safe sex. Taking low doses of aspirin every day. Taking vitamin and mineral supplements as recommended by your health care provider. What happens during an annual well check? The services and screenings done by your health care provider during your annual well check will depend on your age, overall health, lifestyle risk factors, and family history of disease. Counseling  Your health care provider may ask you questions about your: Alcohol use. Tobacco use. Drug use. Emotional well-being. Home and relationship well-being. Sexual activity. Eating habits. History of falls. Memory and ability to understand (cognition). Work and work Astronomer. Screening  You may have the following tests or measurements: Height, weight, and BMI. Blood pressure. Lipid and cholesterol levels. These may be checked every 5 years, or more frequently if you are over 6 years old. Skin check. Lung cancer screening. You may have this screening every year starting at age 49 if you have a 30-pack-year history of smoking and currently smoke or have quit within the past 15 years. Fecal occult blood test (FOBT) of the stool. You may have this test every year starting at age 79. Flexible sigmoidoscopy or colonoscopy. You may have a sigmoidoscopy every 5 years or a colonoscopy every 10 years starting at age 33. Prostate cancer screening. Recommendations will vary depending on your family history and other risks. Hepatitis C blood test. Hepatitis B blood test. Sexually transmitted disease (STD) testing. Diabetes screening. This  is done by checking your blood sugar (glucose) after you have not eaten for a while (fasting). You may have this done every 1-3 years. Abdominal aortic aneurysm (AAA) screening. You may need this if you are a current or former smoker. Osteoporosis. You may be screened starting at age 40 if you are at high risk. Talk with your health care provider about your test results,  treatment options, and if necessary, the need for more tests. Vaccines  Your health care provider may recommend certain vaccines, such as: Influenza vaccine. This is recommended every year. Tetanus, diphtheria, and acellular pertussis (Tdap, Td) vaccine. You may need a Td booster every 10 years. Zoster vaccine. You may need this after age 40. Pneumococcal 13-valent conjugate (PCV13) vaccine. One dose is recommended after age 37. Pneumococcal polysaccharide (PPSV23) vaccine. One dose is recommended after age 74. Talk to your health care provider about which screenings and vaccines you need and how often you need them. This information is not intended to replace advice given to you by your health care provider. Make sure you discuss any questions you have with your health care provider. Document Released: 07/10/2015 Document Revised: 03/02/2016 Document Reviewed: 04/14/2015 Elsevier Interactive Patient Education  2017 [place] Prevention in the Home Falls can cause injuries. They can happen to people of all ages. There are many things you can do to make your home safe and to help prevent falls. What can I do on the outside of my home? Regularly fix the edges of walkways and driveways and fix any cracks. Remove anything that might make you trip as you walk through a door, such as a raised step or threshold. Trim any bushes or trees on the path to your home. Use bright outdoor lighting. Clear any walking paths of anything that might make someone trip, such as rocks or tools. Regularly check to see if handrails are loose or broken. Make sure that both sides of any steps have handrails. Any raised decks and porches should have guardrails on the edges. Have any leaves, snow, or ice cleared regularly. Use sand or salt on walking paths during winter. Clean up any spills in your garage right away. This includes oil or grease spills. What can I do in the bathroom? Use night  lights. Install grab bars by the toilet and in the tub and shower. Do not use towel bars as grab bars. Use non-skid mats or decals in the tub or shower. If you need to sit down in the shower, use a plastic, non-slip stool. Keep the floor dry. Clean up any water that spills on the floor as soon as it happens. Remove soap buildup in the tub or shower regularly. Attach bath mats securely with double-sided non-slip rug tape. Do not have throw rugs and other things on the floor that can make you trip. What can I do in the bedroom? Use night lights. Make sure that you have a light by your bed that is easy to reach. Do not use any sheets or blankets that are too big for your bed. They should not hang down onto the floor. Have a firm chair that has side arms. You can use this for support while you get dressed. Do not have throw rugs and other things on the floor that can make you trip. What can I do in the kitchen? Clean up any spills right away. Avoid walking on wet floors. Keep items that you use a lot in easy-to-reach places. If you  need to reach something above you, use a strong step stool that has a grab bar. Keep electrical cords out of the way. Do not use floor polish or wax that makes floors slippery. If you must use wax, use non-skid floor wax. Do not have throw rugs and other things on the floor that can make you trip. What can I do with my stairs? Do not leave any items on the stairs. Make sure that there are handrails on both sides of the stairs and use them. Fix handrails that are broken or loose. Make sure that handrails are as long as the stairways. Check any carpeting to make sure that it is firmly attached to the stairs. Fix any carpet that is loose or worn. Avoid having throw rugs at the top or bottom of the stairs. If you do have throw rugs, attach them to the floor with carpet tape. Make sure that you have a light switch at the top of the stairs and the bottom of the stairs. If  you do not have them, ask someone to add them for you. What else can I do to help prevent falls? Wear shoes that: Do not have high heels. Have rubber bottoms. Are comfortable and fit you well. Are closed at the toe. Do not wear sandals. If you use a stepladder: Make sure that it is fully opened. Do not climb a closed stepladder. Make sure that both sides of the stepladder are locked into place. Ask someone to hold it for you, if possible. Clearly mark and make sure that you can see: Any grab bars or handrails. First and last steps. Where the edge of each step is. Use tools that help you move around (mobility aids) if they are needed. These include: Canes. Walkers. Scooters. Crutches. Turn on the lights when you go into a dark area. Replace any light bulbs as soon as they burn out. Set up your furniture so you have a clear path. Avoid moving your furniture around. If any of your floors are uneven, fix them. If there are any pets around you, be aware of where they are. Review your medicines with your doctor. Some medicines can make you feel dizzy. This can increase your chance of falling. Ask your doctor what other things that you can do to help prevent falls. This information is not intended to replace advice given to you by your health care provider. Make sure you discuss any questions you have with your health care provider. Document Released: 04/09/2009 Document Revised: 11/19/2015 Document Reviewed: 07/18/2014 Elsevier Interactive Patient Education  2017 [employer].

## 2023-01-29 NOTE — Progress Notes (Unsigned)
Cardiology Office Note:   Date:  01/30/2023  ID:  Edward Pittman, DOB 11/29/1940, MRN [id number] PCP: Edward Grandchild, MD  [facility] Providers Cardiologist:  Edward Rotunda, MD {  History of Present Illness:   Edward Pittman is a 82 y.o. male who presents for follow up of atrial fib.   He had atrial fib and had ablation but had recurrent fib.   He had a CVA in 2022.  Since I last saw him he has done okay.  He still working a little bit and Psychologist, counselling.  The patient denies any new symptoms such as chest discomfort, neck or arm discomfort. There has been no new shortness of breath, PND or orthopnea. There have been no reported palpitations, presyncope or syncope.   Of note he has not been taking his beta-blocker or diltiazem as prescribed.   ROS: As stated in the HPI and negative for all other systems.  Studies Reviewed:    EKG:   EKG Interpretation Date/Time:  Monday January 30 2023 08:52:06 EDT Ventricular Rate:  103 PR Interval:    QRS Duration:  88 QT Interval:  340 QTC Calculation: 445 R Axis:   84  Text Interpretation: Atrial fibrillation with rapid ventricular response with premature ventricular or aberrantly conducted complexes Possible Anterior infarct , age undetermined When compared with ECG of 19-Dec-2020 13:23, No significant change since last tracing Confirmed by Edward Pittman ([id number]) on 01/30/2023 9:12:19 AM     Risk Assessment/Calculations:    CHA2DS2-VASc Score = 5   This indicates a 7.2% annual risk of stroke. The patient's score is based upon: CHF History: 0 HTN History: 1 Diabetes History: 0 Stroke History: 2 Vascular Disease History: 0 Age Score: 2 Gender Score: 0        Physical Exam:   VS:  BP (!) 150/82 (BP Location: Left Arm, Patient Position: Sitting, Cuff Size: Normal)   Pulse (!) 103   Ht 6\' 1"  (1.854 m)   Wt 241 lb 3.2 oz (109.4 kg)   SpO2 97%   BMI 31.82 kg/m    Wt Readings from Last 3 Encounters:  01/30/23 241 lb 3.2  oz (109.4 kg)  12/22/22 235 lb (106.6 kg)  05/25/22 240 lb 6.4 oz (109 kg)     GEN: Well nourished, well developed in no acute distress NECK: No JVD; No carotid bruits CARDIAC: Irregular RR, no murmurs, rubs, gallops RESPIRATORY:  Clear to auscultation without rales, wheezing or rhonchi  ABDOMEN: Soft, non-tender, non-distended EXTREMITIES:  Mild leg edema; No deformity   ASSESSMENT AND PLAN:   ATRIAL FIB:    [name] has a CHA2DS2 - VASc score of 5.   He does not have rate control returns that he was not taking his Cardizem necessarily.  He is going to start that back and keep an eye on his heart rate.  I will be checking blood work today to include CBC, c-Met, TSH.  HTN:   The blood pressure is elevated but again he has not been taking his Cardizem and not necessarily his beta-blocker every day and he understands he needs to do this.  He will return with blood pressure diary.  DYSLIPIDEMIA:   LDL was 145.  Not sure how compliant he has been with his Lipitor.  Again this is emphasized and the goal of an LDL least less than 100.  I will check a lipid profile today.   CVA: He says he has been compliant with  his Xarelto.   DM: I will check an A1c.  The last was 6.9.  He is due to follow-up with his primary provider soon.         Follow up me in one year.  Thank you Edward Pittman  Signed, Edward Rotunda, MD

## 2023-01-30 ENCOUNTER — Encounter: Payer: Self-pay | Admitting: Cardiology

## 2023-01-30 ENCOUNTER — Ambulatory Visit: Payer: Medicare PPO | Attending: Cardiology | Admitting: Cardiology

## 2023-01-30 VITALS — BP 150/82 | HR 103 | Ht 73.0 in | Wt 241.2 lb

## 2023-01-30 DIAGNOSIS — I4819 Other persistent atrial fibrillation: Secondary | ICD-10-CM

## 2023-01-30 DIAGNOSIS — E785 Hyperlipidemia, unspecified: Secondary | ICD-10-CM

## 2023-01-30 DIAGNOSIS — I482 Chronic atrial fibrillation, unspecified: Secondary | ICD-10-CM | POA: Diagnosis not present

## 2023-01-30 DIAGNOSIS — I10 Essential (primary) hypertension: Secondary | ICD-10-CM | POA: Diagnosis not present

## 2023-01-30 DIAGNOSIS — E118 Type 2 diabetes mellitus with unspecified complications: Secondary | ICD-10-CM | POA: Diagnosis not present

## 2023-01-30 MED ORDER — RIVAROXABAN 20 MG PO TABS: 20.0000 mg | ORAL_TABLET | Freq: Every day | ORAL | 0 refills | Status: DC

## 2023-01-30 NOTE — Patient Instructions (Addendum)
Medication Instructions:  Your physician recommends that you continue on your current medications as directed. Please refer to the Current Medication list given to you today.  *If you need a refill on your cardiac medications before your next appointment, please call your pharmacy*   Lab Work: Your physician recommends that you have labs drawn today: CMET, CBC, TSH, Lipids, A1C  If you have labs (blood work) drawn today and your tests are completely normal, you will receive your results only by: MyChart Message (if you have MyChart) OR A paper copy in the mail If you have any lab test that is abnormal or we need to change your treatment, we will call you to review the results.   Follow-Up: At [facility], you and your health needs are our priority.  As part of our continuing mission to provide you with exceptional heart care, we have created designated Provider Care Teams.  These Care Teams include your primary Cardiologist (physician) and Advanced Practice Providers (APPs -  Physician Assistants and Nurse Practitioners) who all work together to provide you with the care you need, when you need it.  We recommend signing up for the patient portal called "MyChart".  Sign up information is provided on this After Visit Summary.  MyChart is used to connect with patients for Virtual Visits (Telemedicine).  Patients are able to view lab/test results, encounter notes, upcoming appointments, etc.  Non-urgent messages can be sent to your provider as well.   To learn more about what you can do with MyChart, go to [email or web address].    Your next appointment:   6 month(s)  Provider:   [name], MD     Other Instructions Dr. [name] would like for you to keep a blood pressure log. After 2 weeks please either call or send via Mychart for review.

## 2023-02-06 ENCOUNTER — Encounter: Payer: Self-pay | Admitting: *Deleted

## 2023-04-20 ENCOUNTER — Other Ambulatory Visit: Payer: Self-pay

## 2023-04-20 ENCOUNTER — Emergency Department (HOSPITAL_BASED_OUTPATIENT_CLINIC_OR_DEPARTMENT_OTHER): Payer: Medicare PPO

## 2023-04-20 ENCOUNTER — Emergency Department (HOSPITAL_BASED_OUTPATIENT_CLINIC_OR_DEPARTMENT_OTHER)
Admission: EM | Admit: 2023-04-20 | Discharge: 2023-04-20 | Disposition: A | Discharge status: Home / Self Care | Payer: Medicare PPO | Attending: Emergency Medicine | Admitting: Emergency Medicine

## 2023-04-20 ENCOUNTER — Encounter (HOSPITAL_BASED_OUTPATIENT_CLINIC_OR_DEPARTMENT_OTHER): Payer: Self-pay | Admitting: Emergency Medicine

## 2023-04-20 ENCOUNTER — Ambulatory Visit
Admission: EM | Admit: 2023-04-20 | Discharge: 2023-04-20 | Disposition: A | Discharge status: Home / Self Care | Payer: Medicare PPO | Attending: Family Medicine | Admitting: Family Medicine

## 2023-04-20 ENCOUNTER — Encounter: Payer: Self-pay | Admitting: *Deleted

## 2023-04-20 ENCOUNTER — Other Ambulatory Visit: Payer: Self-pay | Admitting: Internal Medicine

## 2023-04-20 DIAGNOSIS — Z8673 Personal history of transient ischemic attack (TIA), and cerebral infarction without residual deficits: Secondary | ICD-10-CM | POA: Insufficient documentation

## 2023-04-20 DIAGNOSIS — I10 Essential (primary) hypertension: Secondary | ICD-10-CM | POA: Diagnosis not present

## 2023-04-20 DIAGNOSIS — R55 Syncope and collapse: Secondary | ICD-10-CM

## 2023-04-20 DIAGNOSIS — I482 Chronic atrial fibrillation, unspecified: Secondary | ICD-10-CM

## 2023-04-20 DIAGNOSIS — M25552 Pain in left hip: Secondary | ICD-10-CM | POA: Diagnosis not present

## 2023-04-20 DIAGNOSIS — M19021 Primary osteoarthritis, right elbow: Secondary | ICD-10-CM | POA: Diagnosis not present

## 2023-04-20 DIAGNOSIS — Z79899 Other long term (current) drug therapy: Secondary | ICD-10-CM | POA: Insufficient documentation

## 2023-04-20 DIAGNOSIS — M25521 Pain in right elbow: Secondary | ICD-10-CM | POA: Diagnosis not present

## 2023-04-20 DIAGNOSIS — Z7901 Long term (current) use of anticoagulants: Secondary | ICD-10-CM | POA: Diagnosis not present

## 2023-04-20 DIAGNOSIS — R531 Weakness: Secondary | ICD-10-CM | POA: Diagnosis not present

## 2023-04-20 DIAGNOSIS — M25551 Pain in right hip: Secondary | ICD-10-CM | POA: Diagnosis not present

## 2023-04-20 LAB — CBC WITH DIFFERENTIAL/PLATELET
Abs Immature Granulocytes: 0.04 10*3/uL (ref 0.00–0.07)
Basophils Absolute: 0 10*3/uL (ref 0.0–0.1)
Basophils Relative: 0 %
Eosinophils Absolute: 0 10*3/uL (ref 0.0–0.5)
Eosinophils Relative: 0 %
HCT: 42.3 % (ref 39.0–52.0)
Hemoglobin: 14.3 g/dL (ref 13.0–17.0)
Immature Granulocytes: 1 %
Lymphocytes Relative: 17 %
Lymphs Abs: 1.4 10*3/uL (ref 0.7–4.0)
MCH: 33.1 pg (ref 26.0–34.0)
MCHC: 33.8 g/dL (ref 30.0–36.0)
MCV: 97.9 fL (ref 80.0–100.0)
Monocytes Absolute: 0.4 10*3/uL (ref 0.1–1.0)
Monocytes Relative: 5 %
Neutro Abs: 6.3 10*3/uL (ref 1.7–7.7)
Neutrophils Relative %: 77 %
Platelets: 158 10*3/uL (ref 150–400)
RBC: 4.32 MIL/uL (ref 4.22–5.81)
RDW: 13.3 % (ref 11.5–15.5)
WBC: 8.1 10*3/uL (ref 4.0–10.5)
nRBC: 0 % (ref 0.0–0.2)

## 2023-04-20 LAB — BASIC METABOLIC PANEL
Anion gap: 9 (ref 5–15)
BUN: 19 mg/dL (ref 8–23)
CO2: 24 mmol/L (ref 22–32)
Calcium: 9.2 mg/dL (ref 8.9–10.3)
Chloride: 104 mmol/L (ref 98–111)
Creatinine, Ser: 0.84 mg/dL (ref 0.61–1.24)
GFR, Estimated: >60 mL/min (ref >60)
Glucose, Bld: 158 mg/dL — ABNORMAL HIGH (ref 70–99)
Potassium: 3.9 mmol/L (ref 3.5–5.1)
Sodium: 137 mmol/L (ref 135–145)

## 2023-04-20 LAB — MAGNESIUM: Magnesium: 2.1 mg/dL (ref 1.7–2.4)

## 2023-04-20 LAB — TROPONIN I (HIGH SENSITIVITY)
Troponin I (High Sensitivity): 11 ng/L (ref <18)
Troponin I (High Sensitivity): 11 ng/L (ref <18)

## 2023-04-20 NOTE — ED Triage Notes (Addendum)
[place] the beach.  Pt became very lethargic after walking quickly.  Pt had syncopal event around 12 pm.  Pt drove from [place] back to [place] to urgent care.  Pt sent here for further evaluation.  Pt states he feels better now.  Slight dizziness.  Pt doesn't remember hitting his head.  Denies headache.  Pt c/o right hip pain. Pt has Afib and takes Xarelto.

## 2023-04-20 NOTE — ED Provider Notes (Signed)
[facility] Provider Note   CSN: [id number] Arrival date & time: 04/20/23  1727     History  Chief Complaint  Patient presents with   Loss of Consciousness    Edward Pittman is a 82 y.o. male with a past medical history of hypertension, A-fib on Xarelto, stroke presents today for evaluation of syncope.  Patient reports that he [place] one [place].  Patient states that after walking from one end of the pier to the other he started feeling lightheadedness and passed out.  He was able to hang onto the rail.  He endorses right elbow and right hip pain.  He denies any chest pain or shortness of breath before or after the fall.  He denies any vision changes, nausea, vomiting, chest pain, shortness of breath, bowel change, urinary symptoms, blood in his stool or urine, rash.   Loss of Consciousness   Past Medical History:  Diagnosis Date   Chronic systolic dysfunction of left ventricle    Dyslipidemia    HTN (hypertension)    Hx of adenomatous colonic polyps    Hx of colonoscopy    Keloid    Mild mitral regurgitation by prior echocardiogram    Nonischemic cardiomyopathy (HCC)    Persistent atrial fibrillation [facility])    s/p ablation 4/16  Dr. Johney Frame   Past Surgical History:  Procedure Laterality Date   ATRIAL FIBRILLATION ABLATION N/A 10/11/2011   Procedure: ATRIAL FIBRILLATION ABLATION;  Surgeon: Hillis Range, MD;  Location: [facility] CATH LAB;  Service: Cardiovascular;  Laterality: N/A;   COLONOSCOPY     FEMORAL HERNIA REPAIR     at age 64   HERNIA REPAIR     TEE WITHOUT CARDIOVERSION  10/10/2011   Procedure: TRANSESOPHAGEAL ECHOCARDIOGRAM (TEE);  Surgeon: Pricilla Riffle, MD;  Location: [facility] ENDOSCOPY;  Service: Cardiovascular;  Laterality: N/A;     Home Medications Prior to Admission medications   Medication Sig Start Date End Date Taking? Authorizing Provider  atorvastatin (LIPITOR) 40 MG tablet TAKE  1 TABLET(40 MG) BY MOUTH DAILY 08/25/22   Etta Grandchild, MD  B Complex-Biotin-FA (B COMPLETE) TABS Take 1 tablet by mouth daily with breakfast.    [provider]  diltiazem (CARDIZEM CD) 240 MG 24 hr capsule TAKE 1 CAPSULE(240 MG) BY MOUTH DAILY 03/13/22   Etta Grandchild, MD  dorzolamide (TRUSOPT) 2 % ophthalmic solution Place 1 drop into both eyes 2 (two) times daily. 01/20/23   [provider]  empagliflozin (JARDIANCE) 25 MG TABS tablet Take 1 tablet (25 mg total) by mouth daily before breakfast. 02/10/22   Etta Grandchild, MD  latanoprost (XALATAN) 0.005 % ophthalmic solution  03/22/21   [provider]  metoprolol tartrate (LOPRESSOR) 100 MG tablet TAKE 1 TABLET(100 MG) BY MOUTH TWICE DAILY 01/14/21   [name], MD  rivaroxaban (XARELTO) 20 MG TABS tablet Take 1 tablet (20 mg total) by mouth daily with supper. 01/30/23   [name], MD  sildenafil (REVATIO) 20 MG tablet Take 1 tablet (20 mg total) by mouth as needed. 05/31/21   [name], MD      Allergies    Benazepril and Metformin and related    Review of Systems   Review of Systems  Cardiovascular:  Positive for syncope.    Physical Exam Updated Vital Signs BP (!) 146/81 (BP Location: Right Arm)   Pulse 82   Temp 98 F (36.7 C) (  Oral)   Resp 16   Ht 6\' 1"  (1.854 m)   Wt 108.9 kg   SpO2 98%   BMI 31.66 kg/m  Physical Exam Vitals and nursing note reviewed.  Constitutional:      Appearance: Normal appearance.  HENT:     Head: Normocephalic and atraumatic.     Mouth/Throat:     Mouth: Mucous membranes are moist.  Eyes:     General: No scleral icterus. Cardiovascular:     Rate and Rhythm: Normal rate and regular rhythm.     Pulses: Normal pulses.     Heart sounds: Normal heart sounds.  Pulmonary:     Effort: Pulmonary effort is normal.     Breath sounds: Normal breath sounds.  Abdominal:     General: Abdomen is flat.     Palpations: Abdomen is soft.     Tenderness: There is  no abdominal tenderness.  Musculoskeletal:        General: No deformity.  Skin:    General: Skin is warm.     Findings: No rash.  Neurological:     General: No focal deficit present.     Mental Status: He is alert.     Comments: Cranial nerves II through XII intact. Intact sensation to light touch in all 4 extremities. 5/5 strength in all 4 extremities. Intact finger-to-nose and heel-to-shin of all 4 extremities. No visual field cuts. No neglect noted. No aphasia noted.  Psychiatric:        Mood and Affect: Mood normal.    ED Results / Procedures / Treatments   Labs (all labs ordered are listed, but only abnormal results are displayed) Labs Reviewed  CBC WITH DIFFERENTIAL/PLATELET  BASIC METABOLIC PANEL  URINALYSIS, ROUTINE W REFLEX MICROSCOPIC  MAGNESIUM  TROPONIN I (HIGH SENSITIVITY)    EKG EKG Interpretation Date/Time:  Thursday April 20 2023 17:43:35 EDT Ventricular Rate:  79 PR Interval:    QRS Duration:  101 QT Interval:  400 QTC Calculation: 459 R Axis:   77  Text Interpretation: Atrial fibrillation No significant change since last tracing Confirmed by Elayne Snare ([id number]) on 04/20/2023 5:53:00 PM  Radiology No results found.  Procedures Procedures    Medications Ordered in ED Medications - No data to display  ED Course/ Medical Decision Making/ A&P                                 Medical Decision Making Amount and/or Complexity of Data Reviewed Labs: ordered. Radiology: ordered.   This patient presents to the ED for syncope, this involves an extensive number of treatment options, and is a complaint that carries with a high risk of complications and morbidity.  The differential diagnosis includes anemia, hypoglycemia, arrhythmia, valvular disorders, PE, subarachnoid hemorrhage, orthostatic, dehydration, seizure, vasovagal.  This is not an exhaustive list.  Lab tests: I ordered and personally interpreted labs.  The pertinent results  include: WBC unremarkable. Hbg unremarkable. Platelets unremarkable. Electrolytes unremarkable. BUN, creatinine unremarkable.  Delta troponin is negative.  Magnesium is normal.  Imaging studies: I ordered imaging studies, personally reviewed, interpreted imaging and agree with the radiologist's interpretations. The results include: CT head, cervical spine are unremarkable.  X-ray of right elbow and right hip showed no evidence of fracture dislocation.   Problem list/ ED course/ Critical interventions/ Medical management: HPI: See above Vital signs within normal range and stable throughout visit. Laboratory/imaging studies significant for: See above. On  physical examination, patient is afebrile and appears in no acute distress. Given history, exam and workup, low suspicion for HF, ICH (no trauma, headache), seizure (no witnessed seizure like activity, no postictal period, tongue laceration, bladder incontinence), stroke (no focal neuro deficits), ACS (neg troponin, no anginal pain), aortic dissection (no chest pain), malignant arrhythmia on ekg or any family history of sudden death, or GI bleed (stable hgb). Low suspicion for PE given normal vital signs, absence of chest pain or dyspnea, no evidence of DVT, no recent surgery/immobilization. Based on canadian syncope rule, patient is low risk and well appearing here, plan to discharge the patient home with PCP follow up. I have reviewed the patient home medicines and have made adjustments as needed.  Cardiac monitoring/EKG: The patient was maintained on a cardiac monitor.  I personally reviewed and interpreted the cardiac monitor which showed an underlying rhythm of: sinus rhythm.  Additional history obtained: External records from outside source obtained and reviewed including: Chart review including previous notes, labs, imaging.  Consultations obtained:  Disposition Continued outpatient therapy. Follow-up with PCP recommended for reevaluation  of symptoms. Treatment plan discussed with patient.  Pt acknowledged understanding was agreeable to the plan. Worrisome signs and symptoms were discussed with patient, and patient acknowledged understanding to return to the ED if they noticed these signs and symptoms. Patient was stable upon discharge.   This chart was dictated using voice recognition software.  Despite best efforts to proofread,  errors can occur which can change the documentation meaning.          Final Clinical Impression(s) / ED Diagnoses Final diagnoses:  Syncope, unspecified syncope type    Rx / DC Orders ED Discharge Orders     None         Jeanelle Malling, [place] 04/20/23 2256    Elayne Snare K, DO 04/20/23 2323

## 2023-04-20 NOTE — ED Notes (Signed)
Ambulated patient via order. Resting values, oxygen saturation 96%/ RR 16/ HR 80. Patient able to maintain a normal gait speak in complete sentences without difficulty. Patient returned to room, HR 84/ RR 15/ oxygen saturation 94%. Patient had no dizziness while walking. Patient returned to room placed back on monitor.

## 2023-04-20 NOTE — Discharge Instructions (Addendum)
Your workup today was overall reassuring.. I recommend close follow-up with PCP for reevaluation.  Please do not hesitate to return to emergency department if worrisome signs symptoms we discussed become apparent.

## 2023-04-20 NOTE — ED Notes (Signed)
Patient is being discharged from the Urgent Care and sent to the Emergency Department via pov . Per Dr. Tracie Harrier, patient is in need of higher level of care due to syncope. Patient is aware and verbalizes understanding of plan of care. Pt's wife present to drive Vitals:   24/40/10 2725  BP: 134/86  Pulse: 69  Resp: 18  Temp: 98.2 F (36.8 C)  SpO2: 100%

## 2023-04-20 NOTE — ED Notes (Signed)
[place] pier, went walking [place] to the shack to get some ice; walking swiftly and started to get dizzy. Grabbed the railing because of the vision changes, and woke up on the ground of the pier. Got up after falling without issue or complaint. Estimated 30 seconds from beginning of dizziness until syncope.   Never happened before, never happened again. Came home feeling better, and gradually improving consistently. No tingling, numbness, weakness, or unilateral change noted.

## 2023-04-20 NOTE — ED Triage Notes (Addendum)
Pt reports he was fishing at the beach today and "felt weak" and "rested against the rail". Then woke up with other people asking if he was okay. Unsure if he fell. He is here with his wife and they drove straight here. States he "feels fine". Pt is on xarelto

## 2023-04-20 NOTE — ED Provider Notes (Signed)
[facility]   [id number] 04/20/23 Arrival Time: 1620  ASSESSMENT & PLAN:  1. Syncope, unspecified syncope type    Limited diagnostic capability here.  Recommend ED evaluation. He agrees. D/C to ED via POV; stable upon discharge; wife is driving.  Normal neurologic exam.   CBG 144.  ECG: A.Fib (chronic problem)  Reviewed expectations re: course of current medical issues. Questions answered. Outlined signs and symptoms indicating need for more acute intervention. Patient verbalized understanding. After Visit Summary given.   SUBJECTIVE:  Edward Pittman is a 82 y.o. male who reports fishing at the beach today and "felt weak" and "rested against the rail". Then woke up with other people asking if he was okay. Unsure if he fell. He is here with his wife and they drove straight here. States he "feels fine". Pt is on Xarelto. Denies CP/SOB. No further/similar episodes. Denies recent illness.   Social History   Substance and Sexual Activity  Alcohol Use No   Social History   Tobacco Use  Smoking Status Former   Current packs/day: 0.00   Types: Cigarettes   Quit date: 08/09/1999   Years since quitting: 23.7  Smokeless Tobacco Never  Tobacco Comments   quit 9 years ago     OBJECTIVE:  Vitals:   04/20/23 1628  BP: 134/86  Pulse: 69  Resp: 18  Temp: 98.2 F (36.8 C)  TempSrc: Oral  SpO2: 100%    General appearance: alert; no distress Eyes: PERRLA; EOMI; conjunctiva normal HENT: normocephalic; atraumatic Neck: supple with FROM Lungs: clear to auscultation bilaterally Heart: irreg irreg Abdomen: soft, non-tender; bowel sounds normal Extremities: no cyanosis or edema; symmetrical with no gross deformities Skin: warm and dry Neurologic: normal gait; DTR's normal and symmetric; CN 2-12 grossly intact Psychological: alert and cooperative; normal mood and affect  Investigations:  Labs Reviewed  POCT FASTING CBG KUC MANUAL ENTRY     Allergies   Allergen Reactions   Benazepril Cough   Metformin And Related Nausea Only    Past Medical History:  Diagnosis Date   Chronic systolic dysfunction of left ventricle    Dyslipidemia    HTN (hypertension)    Hx of adenomatous colonic polyps    Hx of colonoscopy    Keloid    Mild mitral regurgitation by prior echocardiogram    Nonischemic cardiomyopathy (HCC)    Persistent atrial fibrillation (HCC)    s/p ablation 4/16  Dr. Johney Frame   Social History   Socioeconomic History   Marital status: Married    Spouse name: Not on file   Number of children: 4   Years of education: Not on file   Highest education level: Not on file  Occupational History   Occupation: Autobody Repairman  Tobacco Use   Smoking status: Former    Current packs/day: 0.00    Types: Cigarettes    Quit date: 08/09/1999    Years since quitting: 23.7   Smokeless tobacco: Never   Tobacco comments:    quit 9 years ago  Vaping Use   Vaping status: Never Used  Substance and Sexual Activity   Alcohol use: No   Drug use: No   Sexual activity: Not Currently  Other Topics Concern   Not on file  Social History Narrative   Married   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      4 kids + grandkids      Former smoker, no current  tobacco alcohol or drug use      Social Determinants of Health   Financial Resource Strain: Low Risk  (12/22/2022)   Overall Financial Resource Strain (CARDIA)    Difficulty of Paying Living Expenses: Not hard at all  Food Insecurity: No Food Insecurity (12/22/2022)   Hunger Vital Sign    Worried About Running Out of Food in the Last Year: Never true    Ran Out of Food in the Last Year: Never true  Transportation Needs: No Transportation Needs (12/22/2022)   PRAPARE - Administrator, Civil Service (Medical): No    Lack of Transportation (Non-Medical): No  Physical Activity: Inactive (12/22/2022)   Exercise Vital Sign    Days of  Exercise per Week: 0 days    Minutes of Exercise per Session: 0 min  Stress: No Stress Concern Present (12/22/2022)   [employer] of Occupational Health - Occupational Stress Questionnaire    Feeling of Stress : Not at all  Social Connections: Socially Integrated (12/22/2022)   Social Connection and Isolation Panel [NHANES]    Frequency of Communication with Friends and Family: More than three times a week    Frequency of Social Gatherings with Friends and Family: More than three times a week    Attends Religious Services: More than 4 times per year    Active Member of Clubs or Organizations: Yes    Attends Banker Meetings: More than 4 times per year    Marital Status: Married  Catering manager Violence: Not At Risk (12/22/2022)   Humiliation, Afraid, Rape, and Kick questionnaire    Fear of Current or Ex-Partner: No    Emotionally Abused: No    Physically Abused: No    Sexually Abused: No   Family History  Problem Relation Age of Onset   Brain cancer Mother    Breast cancer Mother    Diabetes Sister    Breast cancer Sister    Brain cancer Sister    Heart attack Father    Lung cancer Brother    Lung cancer Brother    Colon cancer Other        uncle   Prostate cancer Other        uncle   Diabetes Sister    Heart disease Neg Hx    Esophageal cancer Neg Hx    Rectal cancer Neg Hx    Stomach cancer Neg Hx    Past Surgical History:  Procedure Laterality Date   ATRIAL FIBRILLATION ABLATION N/A 10/11/2011   Procedure: ATRIAL FIBRILLATION ABLATION;  Surgeon: Hillis Range, MD;  Location: [facility] CATH LAB;  Service: Cardiovascular;  Laterality: N/A;   COLONOSCOPY     FEMORAL HERNIA REPAIR     at age 51   HERNIA REPAIR     TEE WITHOUT CARDIOVERSION  10/10/2011   Procedure: TRANSESOPHAGEAL ECHOCARDIOGRAM (TEE);  Surgeon: Pricilla Riffle, MD;  Location: [facility] ENDOSCOPY;  Service: Cardiovascular;  Laterality: N/A;       Mardella Layman, MD 04/20/23 1730

## 2023-05-08 DIAGNOSIS — H401132 Primary open-angle glaucoma, bilateral, moderate stage: Secondary | ICD-10-CM | POA: Diagnosis not present

## 2023-08-15 DIAGNOSIS — Z01818 Encounter for other preprocedural examination: Secondary | ICD-10-CM | POA: Diagnosis not present

## 2023-08-15 DIAGNOSIS — E119 Type 2 diabetes mellitus without complications: Secondary | ICD-10-CM | POA: Diagnosis not present

## 2023-08-15 DIAGNOSIS — H25811 Combined forms of age-related cataract, right eye: Secondary | ICD-10-CM | POA: Diagnosis not present

## 2023-08-15 DIAGNOSIS — H25813 Combined forms of age-related cataract, bilateral: Secondary | ICD-10-CM | POA: Diagnosis not present

## 2023-08-15 DIAGNOSIS — H401132 Primary open-angle glaucoma, bilateral, moderate stage: Secondary | ICD-10-CM | POA: Diagnosis not present

## 2023-09-06 LAB — HM DIABETES EYE EXAM

## 2023-09-07 DIAGNOSIS — H401132 Primary open-angle glaucoma, bilateral, moderate stage: Secondary | ICD-10-CM | POA: Diagnosis not present

## 2023-09-07 DIAGNOSIS — E1139 Type 2 diabetes mellitus with other diabetic ophthalmic complication: Secondary | ICD-10-CM | POA: Diagnosis not present

## 2023-09-07 DIAGNOSIS — E1136 Type 2 diabetes mellitus with diabetic cataract: Secondary | ICD-10-CM | POA: Diagnosis not present

## 2023-09-07 DIAGNOSIS — H25811 Combined forms of age-related cataract, right eye: Secondary | ICD-10-CM | POA: Diagnosis not present

## 2023-09-07 DIAGNOSIS — H401111 Primary open-angle glaucoma, right eye, mild stage: Secondary | ICD-10-CM | POA: Diagnosis not present

## 2023-09-20 ENCOUNTER — Encounter: Payer: Self-pay | Admitting: Internal Medicine

## 2023-09-20 ENCOUNTER — Other Ambulatory Visit: Payer: Self-pay | Admitting: Internal Medicine

## 2023-09-20 ENCOUNTER — Ambulatory Visit: Payer: Medicare PPO | Admitting: Internal Medicine

## 2023-09-20 VITALS — BP 142/88 | HR 110 | Temp 98.2°F | Resp 16 | Ht 73.0 in | Wt 239.4 lb

## 2023-09-20 DIAGNOSIS — I428 Other cardiomyopathies: Secondary | ICD-10-CM

## 2023-09-20 DIAGNOSIS — E118 Type 2 diabetes mellitus with unspecified complications: Secondary | ICD-10-CM

## 2023-09-20 DIAGNOSIS — I482 Chronic atrial fibrillation, unspecified: Secondary | ICD-10-CM

## 2023-09-20 DIAGNOSIS — E785 Hyperlipidemia, unspecified: Secondary | ICD-10-CM

## 2023-09-20 DIAGNOSIS — Z7984 Long term (current) use of oral hypoglycemic drugs: Secondary | ICD-10-CM

## 2023-09-20 DIAGNOSIS — I10 Essential (primary) hypertension: Secondary | ICD-10-CM | POA: Diagnosis not present

## 2023-09-20 DIAGNOSIS — Z0000 Encounter for general adult medical examination without abnormal findings: Secondary | ICD-10-CM | POA: Diagnosis not present

## 2023-09-20 DIAGNOSIS — I4819 Other persistent atrial fibrillation: Secondary | ICD-10-CM

## 2023-09-20 DIAGNOSIS — Z0001 Encounter for general adult medical examination with abnormal findings: Secondary | ICD-10-CM | POA: Insufficient documentation

## 2023-09-20 LAB — URINALYSIS, ROUTINE W REFLEX MICROSCOPIC
Bilirubin Urine: NEGATIVE
Hgb urine dipstick: NEGATIVE
Ketones, ur: NEGATIVE
Leukocytes,Ua: NEGATIVE
Nitrite: NEGATIVE
Specific Gravity, Urine: >=1.03 — AB (ref 1.000–1.030)
Total Protein, Urine: NEGATIVE
Urine Glucose: NEGATIVE
Urobilinogen, UA: 1 (ref 0.0–1.0)
pH: 6 (ref 5.0–8.0)

## 2023-09-20 LAB — BASIC METABOLIC PANEL WITH GFR
BUN: 13 mg/dL (ref 6–23)
CO2: 27 meq/L (ref 19–32)
Calcium: 9.4 mg/dL (ref 8.4–10.5)
Chloride: 106 meq/L (ref 96–112)
Creatinine, Ser: 0.87 mg/dL (ref 0.40–1.50)
GFR: 81.4 mL/min (ref >60.00)
Glucose, Bld: 118 mg/dL — ABNORMAL HIGH (ref 70–99)
Potassium: 4 meq/L (ref 3.5–5.1)
Sodium: 140 meq/L (ref 135–145)

## 2023-09-20 LAB — HEPATIC FUNCTION PANEL
ALT: 14 U/L (ref 0–53)
AST: 21 U/L (ref 0–37)
Albumin: 4.3 g/dL (ref 3.5–5.2)
Alkaline Phosphatase: 208 U/L — ABNORMAL HIGH (ref 39–117)
Bilirubin, Direct: 0.3 mg/dL (ref 0.0–0.3)
Total Bilirubin: 1.2 mg/dL (ref 0.2–1.2)
Total Protein: 7.9 g/dL (ref 6.0–8.3)

## 2023-09-20 LAB — CBC WITH DIFFERENTIAL/PLATELET
Basophils Absolute: 0 10*3/uL (ref 0.0–0.1)
Basophils Relative: 0.7 % (ref 0.0–3.0)
Eosinophils Absolute: 0.1 10*3/uL (ref 0.0–0.7)
Eosinophils Relative: 1.7 % (ref 0.0–5.0)
HCT: 43.6 % (ref 39.0–52.0)
Hemoglobin: 14.6 g/dL (ref 13.0–17.0)
Lymphocytes Relative: 31.9 % (ref 12.0–46.0)
Lymphs Abs: 2 10*3/uL (ref 0.7–4.0)
MCHC: 33.5 g/dL (ref 30.0–36.0)
MCV: 100.1 fl — ABNORMAL HIGH (ref 78.0–100.0)
Monocytes Absolute: 0.6 10*3/uL (ref 0.1–1.0)
Monocytes Relative: 9.5 % (ref 3.0–12.0)
Neutro Abs: 3.5 10*3/uL (ref 1.4–7.7)
Neutrophils Relative %: 56.2 % (ref 43.0–77.0)
Platelets: 154 10*3/uL (ref 150.0–400.0)
RBC: 4.35 Mil/uL (ref 4.22–5.81)
RDW: 13.6 % (ref 11.5–15.5)
WBC: 6.2 10*3/uL (ref 4.0–10.5)

## 2023-09-20 LAB — HEMOGLOBIN A1C: Hgb A1c MFr Bld: 6.6 % — ABNORMAL HIGH (ref 4.6–6.5)

## 2023-09-20 LAB — MICROALBUMIN / CREATININE URINE RATIO
Creatinine,U: 210.7 mg/dL
Microalb Creat Ratio: 16 mg/g (ref 0.0–30.0)
Microalb, Ur: 3.4 mg/dL — ABNORMAL HIGH (ref 0.0–1.9)

## 2023-09-20 MED ORDER — DILTIAZEM HCL ER COATED BEADS 240 MG PO CP24: ORAL_CAPSULE | ORAL | 1 refills | Status: DC

## 2023-09-20 MED ORDER — ATORVASTATIN CALCIUM 40 MG PO TABS: 40.0000 mg | ORAL_TABLET | Freq: Every day | ORAL | 1 refills | Status: DC

## 2023-09-20 NOTE — Telephone Encounter (Signed)
 Copied from CRM [id number]. Topic: Clinical - Medication Refill >> Sep 20, 2023  3:54 PM Lennart Pall wrote: Most Recent Primary Care Visit:  Provider: Mickeal Needy  Department: [facility]  Visit Type: MEDICARE AWV, SEQUENTIAL  Date: 12/22/2022  Medication: atorvastatin (LIPITOR) 40 MG tablet ; rivaroxaban (XARELTO) 20 MG TABS tablet  Has the patient contacted their pharmacy? Yes (Agent: If no, request that the patient contact the pharmacy for the refill. If patient does not wish to contact the pharmacy document the reason why and proceed with request.) (Agent: If yes, when and what did the pharmacy advise?)  Is this the correct pharmacy for this prescription? Yes If no, delete pharmacy and type the correct one.  This is the patient's preferred pharmacy:  [facility] [address] [place], [place] - [address] AT [facility] [address] [place] [address] Phone: [phone] Fax: [phone]   Has the prescription been filled recently? Yes  Is the patient out of the medication? Yes  Has the patient been seen for an appointment in the last year OR does the patient have an upcoming appointment? Yes  Can we respond through MyChart? Yes  Agent: Please be advised that Rx refills may take up to 3 business days. We ask that you follow-up with your pharmacy.

## 2023-09-20 NOTE — Progress Notes (Unsigned)
 Subjective:  Patient ID: Edward Pittman, male    DOB: [birth year]  Age: 84 y.o. MRN: [id number]  CC: Annual Exam, Hypertension, Diabetes, Hyperlipidemia, and Atrial Fibrillation   HPI Edward Pittman presents for a CPX and f/up ----  Discussed the use of AI scribe software for clinical note transcription with the patient, who gave verbal consent to proceed.  History of Present Illness   Edward Pittman is an 83 year old male who presents with elevated heart rate.  He has been experiencing an elevated heart rate for some time, although it does not bother him. No associated symptoms such as chest pain, shortness of breath, dizziness, lightheadedness, or palpitations. He remains physically active and does not experience any discomfort during activities such as climbing.  He recently underwent cataract surgery on one eye approximately two weeks ago and is scheduled for surgery on the other eye tomorrow. No complications from the previous surgery.       Outpatient Medications Prior to Visit  Medication Sig Dispense Refill   B Complex-Biotin-FA (B COMPLETE) TABS Take 1 tablet by mouth daily with breakfast.     dorzolamide (TRUSOPT) 2 % ophthalmic solution Place 1 drop into both eyes 2 (two) times daily.     empagliflozin (JARDIANCE) 25 MG TABS tablet Take 1 tablet (25 mg total) by mouth daily before breakfast. 90 tablet 1   latanoprost (XALATAN) 0.005 % ophthalmic solution      metoprolol tartrate (LOPRESSOR) 100 MG tablet TAKE 1 TABLET(100 MG) BY MOUTH TWICE DAILY 180 tablet 3   sildenafil (REVATIO) 20 MG tablet Take 1 tablet (20 mg total) by mouth as needed. 20 tablet 3   atorvastatin (LIPITOR) 40 MG tablet TAKE 1 TABLET(40 MG) BY MOUTH DAILY 90 tablet 1   diltiazem (CARDIZEM CD) 240 MG 24 hr capsule TAKE 1 CAPSULE(240 MG) BY MOUTH DAILY 90 capsule 1   rivaroxaban (XARELTO) 20 MG TABS tablet Take 1 tablet (20 mg total) by mouth daily with supper. 21 tablet 0   No  facility-administered medications prior to visit.    ROS Review of Systems  Objective:  BP (!) 142/88 (BP Location: Right Arm, Patient Position: Sitting, Cuff Size: Large)   Pulse (!) 110   Temp 98.2 F (36.8 C) (Oral)   Resp 16   Ht 6\' 1"  (1.854 m)   Wt 239 lb 6.4 oz (108.6 kg)   SpO2 95%   BMI 31.59 kg/m   BP Readings from Last 3 Encounters:  09/20/23 (!) 142/88  04/20/23 (!) 144/87  04/20/23 134/86    Wt Readings from Last 3 Encounters:  09/20/23 239 lb 6.4 oz (108.6 kg)  04/20/23 240 lb (108.9 kg)  01/30/23 241 lb 3.2 oz (109.4 kg)    Physical Exam Cardiovascular:     Rate and Rhythm: Tachycardia present. Rhythm regularly irregular.     Comments: EKG- A fib with RVR, 111 bpm No LVH, Q waves, or ST/T wave changes  Unchanged     Lab Results  Component Value Date   WBC 8.1 04/20/2023   HGB 14.3 04/20/2023   HCT 42.3 04/20/2023   PLT 158 04/20/2023   GLUCOSE 158 (H) 04/20/2023   CHOL 182 01/30/2023   TRIG 62 01/30/2023   HDL 36 (L) 01/30/2023   LDLCALC 134 (H) 01/30/2023   ALT 15 01/30/2023   AST 23 01/30/2023   NA 137 04/20/2023   K 3.9 04/20/2023   CL 104 04/20/2023   CREATININE 0.84  04/20/2023   BUN 19 04/20/2023   CO2 24 04/20/2023   TSH 1.600 01/30/2023   PSA 0.67 05/28/2019   INR 1.3 (H) 12/19/2020   HGBA1C 6.9 (H) 01/30/2023   MICROALBUR 1.6 02/10/2022    CT Head Wo Contrast Result Date: 04/20/2023 CLINICAL DATA:  Weakness, syncopal episode, possible fall, on Xarelto EXAM: CT HEAD WITHOUT CONTRAST CT CERVICAL SPINE WITHOUT CONTRAST TECHNIQUE: Multidetector CT imaging of the head and cervical spine was performed following the standard protocol without intravenous contrast. Multiplanar CT image reconstructions of the cervical spine were also generated. RADIATION DOSE REDUCTION: This exam was performed according to the departmental dose-optimization program which includes automated exposure control, adjustment of the mA and/or kV according to  patient size and/or use of iterative reconstruction technique. COMPARISON:  12/19/2020 CT head and CTA head neck comment no prior CT cervical spine available FINDINGS: CT HEAD FINDINGS Brain: No evidence of acute infarct, hemorrhage, mass, mass effect, or midline shift. No hydrocephalus or extra-axial fluid collection. Periventricular white matter changes, likely the sequela of chronic small vessel ischemic disease. Vascular: No hyperdense vessel. Skull: Negative for fracture or focal lesion. Sinuses/Orbits: No acute finding. Other: The mastoid air cells are well aerated. CT CERVICAL SPINE FINDINGS Alignment: No traumatic listhesis. Straightening of the normal cervical lordosis. Skull base and vertebrae: No acute fracture or suspicious osseous lesion. Soft tissues and spinal canal: No prevertebral fluid or swelling. No visible canal hematoma. Disc levels: Degenerative changes in the cervical spine.Mild spinal canal stenosis at C3-C4, C4-C5, C5-C6, and C6-C7. Degenerative changes at the atlantodental interval with possible partial fusion. Upper chest: No focal pulmonary opacity or pleural effusion. IMPRESSION: 1. No acute intracranial process. 2. No acute fracture or traumatic listhesis in the cervical spine. Electronically Signed   By: Wiliam Ke M.D.   On: 04/20/2023 19:57   CT Cervical Spine Wo Contrast Result Date: 04/20/2023 CLINICAL DATA:  Weakness, syncopal episode, possible fall, on Xarelto EXAM: CT HEAD WITHOUT CONTRAST CT CERVICAL SPINE WITHOUT CONTRAST TECHNIQUE: Multidetector CT imaging of the head and cervical spine was performed following the standard protocol without intravenous contrast. Multiplanar CT image reconstructions of the cervical spine were also generated. RADIATION DOSE REDUCTION: This exam was performed according to the departmental dose-optimization program which includes automated exposure control, adjustment of the mA and/or kV according to patient size and/or use of iterative  reconstruction technique. COMPARISON:  12/19/2020 CT head and CTA head neck comment no prior CT cervical spine available FINDINGS: CT HEAD FINDINGS Brain: No evidence of acute infarct, hemorrhage, mass, mass effect, or midline shift. No hydrocephalus or extra-axial fluid collection. Periventricular white matter changes, likely the sequela of chronic small vessel ischemic disease. Vascular: No hyperdense vessel. Skull: Negative for fracture or focal lesion. Sinuses/Orbits: No acute finding. Other: The mastoid air cells are well aerated. CT CERVICAL SPINE FINDINGS Alignment: No traumatic listhesis. Straightening of the normal cervical lordosis. Skull base and vertebrae: No acute fracture or suspicious osseous lesion. Soft tissues and spinal canal: No prevertebral fluid or swelling. No visible canal hematoma. Disc levels: Degenerative changes in the cervical spine.Mild spinal canal stenosis at C3-C4, C4-C5, C5-C6, and C6-C7. Degenerative changes at the atlantodental interval with possible partial fusion. Upper chest: No focal pulmonary opacity or pleural effusion. IMPRESSION: 1. No acute intracranial process. 2. No acute fracture or traumatic listhesis in the cervical spine. Electronically Signed   By: Wiliam Ke M.D.   On: 04/20/2023 19:57   DG Elbow 2 Views Right Result Date: 04/20/2023  CLINICAL DATA:  Pain after fall EXAM: RIGHT ELBOW - 2 VIEW COMPARISON:  None Available. FINDINGS: No fracture or dislocation. No joint effusion lateral view. Preserved bone mineralization. There are some joint space loss about the elbow at the radial head. Antecubital IV site identified. IMPRESSION: Degenerative changes. Electronically Signed   By: Karen Kays M.D.   On: 04/20/2023 19:45   DG HIPS BILAT WITH PELVIS MIN 5 VIEWS Result Date: 04/20/2023 CLINICAL DATA:  Pain after fall EXAM: DG HIP (WITH OR WITHOUT PELVIS) 5V BILAT COMPARISON:  CT scan 12/19/2020 FINDINGS: No acute fracture or dislocation. Mild joint space  loss of the hips. As seen on the prior examination there is diffuse trabecular thickening with sclerosis and hypertrophic changes about the right hemipelvis. Some changes along the left iliac bone as well but to a lesser extent. The right pubic bone is involved. Based on appearance this could represent etiology is Paget's disease but please correlate with clinical history. IMPRESSION: No fracture or dislocation. As seen on prior CT scan there is diffuse right hemipelvic areas of sclerosis, hypertrophic changes and trabecular thickening. Some involvement of the sacrum and left iliac bone as well. Please correlate with any history such as for Paget's disease or other process Electronically Signed   By: Karen Kays M.D.   On: 04/20/2023 19:44    Assessment & Plan:  Chronic atrial fibrillation (HCC) -     EKG 12-Lead  Hyperlipidemia with target LDL less than 100 -     TSH; Future -     Hepatic function panel; Future  Essential hypertension -     Basic metabolic panel; Future -     CBC with Differential/Platelet; Future -     Urinalysis, Routine w reflex microscopic; Future -     dilTIAZem HCl ER Coated Beads; TAKE 1 CAPSULE(240 MG) BY MOUTH DAILY  Dispense: 90 capsule; Refill: 1  Persistent atrial fibrillation (HCC) -     TSH; Future -     Ambulatory referral to Cardiology -     dilTIAZem HCl ER Coated Beads; TAKE 1 CAPSULE(240 MG) BY MOUTH DAILY  Dispense: 90 capsule; Refill: 1  Type 2 diabetes mellitus with complication, without long-term current use of insulin (HCC) -     Microalbumin / creatinine urine ratio; Future -     Urinalysis, Routine w reflex microscopic; Future -     Hemoglobin A1c; Future -     HM Diabetes Foot Exam  Encounter for general adult medical examination with abnormal findings  Cardiomyopathy, nonischemic (HCC) -     Ambulatory referral to Cardiology -     dilTIAZem HCl ER Coated Beads; TAKE 1 CAPSULE(240 MG) BY MOUTH DAILY  Dispense: 90 capsule; Refill: 1      Follow-up: Return in about 6 months (around 03/22/2024).  Sanda Linger, MD

## 2023-09-20 NOTE — Patient Instructions (Signed)
 Health Maintenance, Male  Adopting a healthy lifestyle and getting preventive care are important in promoting health and wellness. Ask your health care provider about:  The right schedule for you to have regular tests and exams.  Things you can do on your own to prevent diseases and keep yourself healthy.  What should I know about diet, weight, and exercise?  Eat a healthy diet    Eat a diet that includes plenty of vegetables, fruits, low-fat dairy products, and lean protein.  Do not eat a lot of foods that are high in solid fats, added sugars, or sodium.  Maintain a healthy weight  Body mass index (BMI) is a measurement that can be used to identify possible weight problems. It estimates body fat based on height and weight. Your health care provider can help determine your BMI and help you achieve or maintain a healthy weight.  Get regular exercise  Get regular exercise. This is one of the most important things you can do for your health. Most adults should:  Exercise for at least 150 minutes each week. The exercise should increase your heart rate and make you sweat (moderate-intensity exercise).  Do strengthening exercises at least twice a week. This is in addition to the moderate-intensity exercise.  Spend less time sitting. Even light physical activity can be beneficial.  Watch cholesterol and blood lipids  Have your blood tested for lipids and cholesterol at 81 years of age, then have this test every 5 years.  You may need to have your cholesterol levels checked more often if:  Your lipid or cholesterol levels are high.  You are older than 81 years of age.  You are at high risk for heart disease.  What should I know about cancer screening?  Many types of cancers can be detected early and may often be prevented. Depending on your health history and family history, you may need to have cancer screening at various ages. This may include screening for:  Colorectal cancer.  Prostate cancer.  Skin cancer.  Lung  cancer.  What should I know about heart disease, diabetes, and high blood pressure?  Blood pressure and heart disease  High blood pressure causes heart disease and increases the risk of stroke. This is more likely to develop in people who have high blood pressure readings or are overweight.  Talk with your health care provider about your target blood pressure readings.  Have your blood pressure checked:  Every 3-5 years if you are 9-95 years of age.  Every year if you are 85 years old or older.  If you are between the ages of 29 and 29 and are a current or former smoker, ask your health care provider if you should have a one-time screening for abdominal aortic aneurysm (AAA).  Diabetes  Have regular diabetes screenings. This checks your fasting blood sugar level. Have the screening done:  Once every three years after age 23 if you are at a normal weight and have a low risk for diabetes.  More often and at a younger age if you are overweight or have a high risk for diabetes.  What should I know about preventing infection?  Hepatitis B  If you have a higher risk for hepatitis B, you should be screened for this virus. Talk with your health care provider to find out if you are at risk for hepatitis B infection.  Hepatitis C  Blood testing is recommended for:  Everyone born from [birth year] through 1963.  Anyone  with known risk factors for hepatitis C.  Sexually transmitted infections (STIs)  You should be screened each year for STIs, including gonorrhea and chlamydia, if:  You are sexually active and are younger than 81 years of age.  You are older than 81 years of age and your health care provider tells you that you are at risk for this type of infection.  Your sexual activity has changed since you were last screened, and you are at increased risk for chlamydia or gonorrhea. Ask your health care provider if you are at risk.  Ask your health care provider about whether you are at high risk for HIV. Your health care provider  may recommend a prescription medicine to help prevent HIV infection. If you choose to take medicine to prevent HIV, you should first get tested for HIV. You should then be tested every 3 months for as long as you are taking the medicine.  Follow these instructions at home:  Alcohol use  Do not drink alcohol if your health care provider tells you not to drink.  If you drink alcohol:  Limit how much you have to 0-2 drinks a day.  Know how much alcohol is in your drink. In the [place] one drink equals one 12 oz bottle of beer (355 mL), one 5 oz glass of wine (148 mL), or one 1 oz glass of hard liquor (44 mL).  Lifestyle  Do not use any products that contain nicotine or tobacco. These products include cigarettes, chewing tobacco, and vaping devices, such as e-cigarettes. If you need help quitting, ask your health care provider.  Do not use street drugs.  Do not share needles.  Ask your health care provider for help if you need support or information about quitting drugs.  General instructions  Schedule regular health, dental, and eye exams.  Stay current with your vaccines.  Tell your health care provider if:  You often feel depressed.  You have ever been abused or do not feel safe at home.  Summary  Adopting a healthy lifestyle and getting preventive care are important in promoting health and wellness.  Follow your health care provider's instructions about healthy diet, exercising, and getting tested or screened for diseases.  Follow your health care provider's instructions on monitoring your cholesterol and blood pressure.  This information is not intended to replace advice given to you by your health care provider. Make sure you discuss any questions you have with your health care provider.  Document Revised: 11/02/2020 Document Reviewed: 11/02/2020  Elsevier Patient Education  2024 [employer].

## 2023-09-21 ENCOUNTER — Ambulatory Visit: Payer: Medicare PPO | Admitting: Internal Medicine

## 2023-09-21 DIAGNOSIS — H401132 Primary open-angle glaucoma, bilateral, moderate stage: Secondary | ICD-10-CM | POA: Diagnosis not present

## 2023-09-21 DIAGNOSIS — H401121 Primary open-angle glaucoma, left eye, mild stage: Secondary | ICD-10-CM | POA: Diagnosis not present

## 2023-09-21 DIAGNOSIS — H401122 Primary open-angle glaucoma, left eye, moderate stage: Secondary | ICD-10-CM | POA: Diagnosis not present

## 2023-09-21 DIAGNOSIS — H25812 Combined forms of age-related cataract, left eye: Secondary | ICD-10-CM | POA: Diagnosis not present

## 2023-09-21 LAB — TSH: TSH: 2.66 u[IU]/mL (ref 0.35–5.50)

## 2023-09-22 MED ORDER — RIVAROXABAN 20 MG PO TABS: 20.0000 mg | ORAL_TABLET | Freq: Every day | ORAL | 1 refills | Status: DC

## 2023-09-23 ENCOUNTER — Encounter: Payer: Self-pay | Admitting: Internal Medicine

## 2023-09-28 ENCOUNTER — Telehealth: Payer: Self-pay | Admitting: *Deleted

## 2023-09-28 NOTE — Progress Notes (Signed)
 Care Guide Pharmacy Note  09/28/2023 Name: Edward Pittman MRN: [id number] DOB: 1941-02-07  Referred By: Etta Grandchild, MD Reason for referral: Care Coordination (Outreach to schedule referral with pharmacist )   Edward Pittman is a 83 y.o. year old male who is a primary care patient of Etta Grandchild, MD.  Edward Pittman was referred to the pharmacist for assistance related to: HTN and DMII  Successful contact was made with the patient to discuss pharmacy services including being ready for the pharmacist to call at least 5 minutes before the scheduled appointment time and to have medication bottles and any blood pressure readings ready for review. The patient agreed to meet with the pharmacist via telephone visit on 10/11/2023  Burman Nieves, CMA [place]  [facility], [facility] Guide Direct Dial: [phone]  Fax: [phone] Website: [email or web address]

## 2023-10-11 ENCOUNTER — Other Ambulatory Visit (INDEPENDENT_AMBULATORY_CARE_PROVIDER_SITE_OTHER): Admitting: Pharmacist

## 2023-10-11 DIAGNOSIS — I482 Chronic atrial fibrillation, unspecified: Secondary | ICD-10-CM

## 2023-10-11 DIAGNOSIS — E785 Hyperlipidemia, unspecified: Secondary | ICD-10-CM

## 2023-10-11 DIAGNOSIS — I10 Essential (primary) hypertension: Secondary | ICD-10-CM

## 2023-10-11 NOTE — Patient Instructions (Signed)
 It was a pleasure speaking with you today!  Continue current regimen - make sure you are taking atorvastatin, diltiazem, and Xarelto every day and avoid missing doses.  Feel free to call with any questions or concerns!  Rainelle Bur, PharmD, BCPS, CPP Clinical Pharmacist Practitioner [facility] [phone]

## 2023-10-11 NOTE — Progress Notes (Signed)
 10/11/2023 Name: Edward Pittman MRN: [id number] DOB: 27-Aug-1940  Chief Complaint  Patient presents with   Hyperlipidemia   Hypertension   Medication Management    Edward Pittman is a 83 y.o. year old male who presented for a telephone visit.   They were referred to the pharmacist by their PCP for assistance in managing hypertension, hyperlipidemia, and atrial fibrillation .    Subjective:  Care Team: Primary Care Provider: Etta Grandchild, MD ; Next Scheduled Visit: none scheduled   Medication Access/Adherence  Current Pharmacy:  [id number] - [place], [place] - [address] AT [facility] [address] [place] [address] Phone: [phone] Fax: [phone]   Patient reports affordability concerns with their medications: Yes  Patient reports access/transportation concerns to their pharmacy: No  Patient reports adherence concerns with their medications:  No    He notes his Xarelto was $591 for 90 DS - he went ahead and paid this because he was concerned about his stroke risk  - Pt appears to have hx of medication nonadherence per refill history   Hypertension:  Current medications: diltiazem 240 mg daily *Has not been taking metoprolol  Patient has a validated, automated, upper arm home BP cuff Current blood pressure readings readings: has not been checking   Hyperlipidemia/ASCVD Risk Reduction  Current lipid lowering medications: atorvastatin 40 mg daily - was not previously taking, notes he is not taking it since seeing PCP  Antiplatelet regimen: DOAC  ASCVD History: hx CVA Risk Factors: T2DM, LDL >70  Pt notes he has been working on diet changes to lower cholesterol   Atrial fibrillation: Current medications: diltiazem, xarelto *Not taking metoprolol  HR has been under 100 (73-94) per patient since starting diltiazem   Objective:  Lab Results  Component Value Date   HGBA1C 6.6 (H) 09/20/2023    Lab Results   Component Value Date   CREATININE 0.87 09/20/2023   BUN 13 09/20/2023   NA 140 09/20/2023   K 4.0 09/20/2023   CL 106 09/20/2023   CO2 27 09/20/2023    Lab Results  Component Value Date   CHOL 182 01/30/2023   HDL 36 (L) 01/30/2023   LDLCALC 134 (H) 01/30/2023   TRIG 62 01/30/2023   CHOLHDL 5.1 (H) 01/30/2023    Medications Reviewed Today     Reviewed by Bonita Quin, RPH (Pharmacist) on 10/11/23 at [id number]  Med List Status: <None>   Medication Order Taking? Sig Documenting Provider Last Dose Status Informant  atorvastatin (LIPITOR) 40 MG tablet [id number] Yes Take 1 tablet (40 mg total) by mouth daily. Etta Grandchild, MD Taking Active   B Complex-Biotin-FA (B COMPLETE) TABS [id number]  Take 1 tablet by mouth daily with breakfast. [provider]  Active Spouse/Significant Other  diltiazem (CARDIZEM CD) 240 MG 24 hr capsule [id number] Yes TAKE 1 CAPSULE(240 MG) BY MOUTH DAILY Etta Grandchild, MD Taking Active   dorzolamide (TRUSOPT) 2 % ophthalmic solution [id number]  Place 1 drop into both eyes 2 (two) times daily. [provider]  Active   empagliflozin (JARDIANCE) 25 MG TABS tablet [id number] No Take 1 tablet (25 mg total) by mouth daily before breakfast.  Patient not taking: Reported on 10/11/2023   Etta Grandchild, MD Not Taking Active   latanoprost (XALATAN) 0.005 % ophthalmic solution [id number]   [provider]  Active   metoprolol tartrate (LOPRESSOR) 100 MG tablet [id number] No TAKE 1 TABLET(100 MG) BY MOUTH TWICE DAILY  Patient  not taking: Reported on 10/11/2023   Eilleen Grates, MD Not Taking Active   rivaroxaban (XARELTO) 20 MG TABS tablet [id number] Yes Take 1 tablet (20 mg total) by mouth daily with supper. Arcadio Knuckles, MD Taking Active   sildenafil (REVATIO) 20 MG tablet [id number]  Take 1 tablet (20 mg total) by mouth as needed. Eilleen Grates, MD  Active               Assessment/Plan:   Medication adherence: - Reviewed  importance of taking medications daily to decrease risk of CVA - Recommended use of a pill box/organizer to help remember medications daily  Hypertension: - Currently uncontrolled, BP goal <140/90  - Reviewed long term cardiovascular and renal outcomes of uncontrolled blood pressure - Reviewed appropriate blood pressure monitoring technique and reviewed goal blood pressure. Recommended to check home blood pressure and heart rate often  - Recommend to continue diltiazem   Hyperlipidemia/ASCVD Risk Reduction: - Currently uncontrolled. LDL goal <70 - Reviewed long term complications of uncontrolled cholesterol - Recommend to continue atorvastatin    Atrial fibrillation: Currently controlled (per pt) - has upcoming cardio appt - Recommend to continue Xarelto and diltiazem  Follow Up Plan: PRN  Rainelle Bur, PharmD, BCPS, CPP Clinical Pharmacist Practitioner [facility] [phone]

## 2023-11-20 NOTE — Progress Notes (Unsigned)
 Cardiology Office Note:   Date:  11/22/2023  ID:  Edward Pittman, DOB [birth year], MRN [id number] PCP: Edward Knuckles, MD  [facility] Providers Cardiologist:  Eilleen Grates, MD {  History of Present Illness:   Edward Pittman is a 83 y.o. male who presents for follow up of atrial fib.   He had atrial fib and had ablation but had recurrent fib.   He had a CVA in 2022.  Since I last saw him he has done well.  He still works part-time doing autobody work.  He has been running his daughter around that she has stage IV cancer and he is her main transportation.  She still does some exercising. The patient denies any new symptoms such as chest discomfort, neck or arm discomfort. There has been no new shortness of breath, PND or orthopnea. There have been no reported palpitations, presyncope or syncope.   ROS: As stated in the HPI and negative for all other systems.  Studies Reviewed:    EKG:   EKG Interpretation Date/Time:  Wednesday Nov 22 2023 08:59:07 EDT Ventricular Rate:  82 PR Interval:    QRS Duration:  90 QT Interval:  388 QTC Calculation: 453 R Axis:   28  Text Interpretation: Atrial fibrillation When compared with ECG of 20-Apr-2023 17:43, No significant change since last tracing Confirmed by Eilleen Grates ([id number]) on 11/22/2023 9:02:53 AM    Risk Assessment/Calculations:    CHA2DS2-VASc Score = 5    This indicates a 7.2% annual risk of stroke. The patient's score is based upon: CHF History: 0 HTN History: 1 Diabetes History: 0 Stroke History: 2 Vascular Disease History: 0 Age Score: 2 Gender Score: 0    Physical Exam:   VS:  BP 136/84 (BP Location: Left Arm, Patient Position: Sitting)   Pulse 82   Ht 6\' 1"  (1.854 m)   Wt 242 lb 6.4 oz (110 kg)   SpO2 97%   BMI 31.98 kg/m    Wt Readings from Last 3 Encounters:  11/22/23 242 lb 6.4 oz (110 kg)  09/20/23 239 lb 6.4 oz (108.6 kg)  04/20/23 240 lb (108.9 kg)     GEN: Well nourished, well developed  in no acute distress NECK: No JVD; No carotid bruits CARDIAC: Irregular RR, soft apical systolic nonradiating murmur, no diastolic murmurs, rubs, gallops RESPIRATORY:  Clear to auscultation without rales, wheezing or rhonchi  ABDOMEN: Soft, non-tender, non-distended EXTREMITIES:  No edema; No deformity   ASSESSMENT AND PLAN:   ATRIAL FIB:    [name] has a CHA2DS2 - VASc score of 5.   He had reasonable rate control.  Last year he was not taking his Cardizem  but now he is in he is checking this with a pulse ox and the rates are in the 60s 70s typically.  He tolerates anticoagulation.  No change in therapy.  He is up-to-date with blood work including CBC.   HTN:   The blood pressure is at target.  No change in therapy.   DYSLIPIDEMIA:   LDL was elevated the last time I would like his LDL to be less than 100.  He is not at target with increase his Lipitor.    CVA: He has no residual from this.  No change in therapy   DM:   A1c was 6.6.   Continue meds as listed.  ELEVATED ALK PHOS:  I did send a note to his primary provider and will defer to his management.  This was elevated last August and consistently elevated from previous.   Follow up with me in one year.   Signed, Eilleen Grates, MD

## 2023-11-22 ENCOUNTER — Ambulatory Visit: Attending: Cardiology | Admitting: Cardiology

## 2023-11-22 ENCOUNTER — Encounter: Payer: Self-pay | Admitting: Cardiology

## 2023-11-22 VITALS — BP 136/84 | HR 82 | Ht 73.0 in | Wt 242.4 lb

## 2023-11-22 DIAGNOSIS — I482 Chronic atrial fibrillation, unspecified: Secondary | ICD-10-CM | POA: Diagnosis not present

## 2023-11-22 DIAGNOSIS — E118 Type 2 diabetes mellitus with unspecified complications: Secondary | ICD-10-CM | POA: Diagnosis not present

## 2023-11-22 DIAGNOSIS — E785 Hyperlipidemia, unspecified: Secondary | ICD-10-CM | POA: Diagnosis not present

## 2023-11-22 DIAGNOSIS — I10 Essential (primary) hypertension: Secondary | ICD-10-CM | POA: Diagnosis not present

## 2023-11-22 DIAGNOSIS — I639 Cerebral infarction, unspecified: Secondary | ICD-10-CM | POA: Diagnosis not present

## 2023-11-22 NOTE — Patient Instructions (Signed)
 Medication Instructions:  Your physician recommends that you continue on your current medications as directed. Please refer to the Current Medication list given to you today.  *If you need a refill on your cardiac medications before your next appointment, please call your pharmacy*  Lab Work: Your physician recommends that you have labs drawn today: Lipids  If you have labs (blood work) drawn today and your tests are completely normal, you will receive your results only by: MyChart Message (if you have MyChart) OR A paper copy in the mail If you have any lab test that is abnormal or we need to change your treatment, we will call you to review the results.   Follow-Up: At [facility], you and your health needs are our priority.  As part of our continuing mission to provide you with exceptional heart care, our providers are all part of one team.  This team includes your primary Cardiologist (physician) and Advanced Practice Providers or APPs (Physician Assistants and Nurse Practitioners) who all work together to provide you with the care you need, when you need it.  Your next appointment:   12 month(s)  Provider:   Eilleen Grates, MD    We recommend signing up for the patient portal called "MyChart".  Sign up information is provided on this After Visit Summary.  MyChart is used to connect with patients for Virtual Visits (Telemedicine).  Patients are able to view lab/test results, encounter notes, upcoming appointments, etc.  Non-urgent messages can be sent to your provider as well.   To learn more about what you can do with MyChart, go to [email or web address].

## 2023-11-23 ENCOUNTER — Ambulatory Visit: Payer: Self-pay | Admitting: *Deleted

## 2023-11-23 LAB — LIPID PANEL
Chol/HDL Ratio: 3.1 ratio (ref 0.0–5.0)
Cholesterol, Total: 107 mg/dL (ref 100–199)
HDL: 35 mg/dL — ABNORMAL LOW (ref >39)
LDL Chol Calc (NIH): 61 mg/dL (ref 0–99)
Triglycerides: 46 mg/dL (ref 0–149)
VLDL Cholesterol Cal: 11 mg/dL (ref 5–40)

## 2023-12-28 ENCOUNTER — Ambulatory Visit: Payer: Medicare PPO

## 2023-12-28 VITALS — Ht 73.0 in | Wt 242.0 lb

## 2023-12-28 DIAGNOSIS — Z5986 Financial insecurity: Secondary | ICD-10-CM | POA: Diagnosis not present

## 2023-12-28 DIAGNOSIS — Z0000 Encounter for general adult medical examination without abnormal findings: Secondary | ICD-10-CM | POA: Diagnosis not present

## 2023-12-28 NOTE — Progress Notes (Signed)
 Subjective:   Edward Pittman is a 83 y.o. who presents for a Medicare Wellness preventive visit.  As a reminder, Annual Wellness Visits don't include a physical exam, and some assessments may be limited, especially if this visit is performed virtually. We may recommend an in-person follow-up visit with your provider if needed.  Visit Complete: Virtual I connected with  Edward Pittman on 12/28/23 by a audio enabled telemedicine application and verified that I am speaking with the correct person using two identifiers.  Patient Location: Home  Provider Location: Office/Clinic  I discussed the limitations of evaluation and management by telemedicine. The patient expressed understanding and agreed to proceed.  Vital Signs: Because this visit was a virtual/telehealth visit, some criteria may be missing or patient reported. Any vitals not documented were not able to be obtained and vitals that have been documented are patient reported.  VideoDeclined- This patient declined Librarian, academic. Therefore the visit was completed with audio only.  Persons Participating in Visit: Patient.  AWV Questionnaire: No: Patient Medicare AWV questionnaire was not completed prior to this visit.  Cardiac Risk Factors include: advanced age (>39men, >11 women);diabetes mellitus;dyslipidemia;male gender;hypertension;obesity (BMI >30kg/m2)     Objective:    Today's Vitals   12/28/23 0813  Weight: 242 lb (109.8 kg)  Height: 6' 1 (1.854 m)   Body mass index is 31.93 kg/m.     12/28/2023    8:12 AM 04/20/2023    5:44 PM 12/22/2022    8:50 AM 01/21/2022    1:05 PM 09/09/2021   10:17 AM 03/23/2021   10:34 AM 01/12/2021    2:40 PM  Advanced Directives  Does Patient Have a Medical Advance Directive? No No No No No No No  Would patient like information on creating a medical advance directive? Yes (MAU/Ambulatory/Procedural Areas - Information given) No - Patient declined No -  Patient declined No - Patient declined No - Patient declined No - Patient declined No - Patient declined    Current Medications (verified) Outpatient Encounter Medications as of 12/28/2023  Medication Sig   atorvastatin  (LIPITOR) 40 MG tablet Take 1 tablet (40 mg total) by mouth daily.   B Complex-Biotin-FA (B COMPLETE) TABS Take 1 tablet by mouth daily with breakfast.   diltiazem  (CARDIZEM  CD) 240 MG 24 hr capsule TAKE 1 CAPSULE(240 MG) BY MOUTH DAILY   dorzolamide (TRUSOPT) 2 % ophthalmic solution Place 1 drop into both eyes 2 (two) times daily.   empagliflozin  (JARDIANCE ) 25 MG TABS tablet Take 1 tablet (25 mg total) by mouth daily before breakfast.   latanoprost  (XALATAN ) 0.005 % ophthalmic solution    metoprolol  tartrate (LOPRESSOR ) 100 MG tablet TAKE 1 TABLET(100 MG) BY MOUTH TWICE DAILY   rivaroxaban  (XARELTO ) 20 MG TABS tablet Take 1 tablet (20 mg total) by mouth daily with supper.   sildenafil  (REVATIO ) 20 MG tablet Take 1 tablet (20 mg total) by mouth as needed.   No facility-administered encounter medications on file as of 12/28/2023.    Allergies (verified) Benazepril  and Metformin and related   History: Past Medical History:  Diagnosis Date   Chronic systolic dysfunction of left ventricle    Dyslipidemia    HTN (hypertension)    Hx of adenomatous colonic polyps    Hx of colonoscopy    Keloid    Mild mitral regurgitation by prior echocardiogram    Nonischemic cardiomyopathy (HCC)    Persistent atrial fibrillation [facility])    s/p ablation 4/16  Dr. Kelsie  Past Surgical History:  Procedure Laterality Date   ATRIAL FIBRILLATION ABLATION N/A 10/11/2011   Procedure: ATRIAL FIBRILLATION ABLATION;  Surgeon: Lynwood Rakers, MD;  Location: [facility] CATH LAB;  Service: Cardiovascular;  Laterality: N/A;   COLONOSCOPY     FEMORAL HERNIA REPAIR     at age 52   HERNIA REPAIR     TEE WITHOUT CARDIOVERSION  10/10/2011   Procedure: TRANSESOPHAGEAL ECHOCARDIOGRAM (TEE);  Surgeon: Vina LULLA Gull,  MD;  Location: [facility] ENDOSCOPY;  Service: Cardiovascular;  Laterality: N/A;   Family History  Problem Relation Age of Onset   Brain cancer Mother    Breast cancer Mother    Diabetes Sister    Breast cancer Sister    Brain cancer Sister    Heart attack Father    Lung cancer Brother    Lung cancer Brother    Colon cancer Other        uncle   Prostate cancer Other        uncle   Diabetes Sister    Heart disease Neg Hx    Esophageal cancer Neg Hx    Rectal cancer Neg Hx    Stomach cancer Neg Hx    Social History   Socioeconomic History   Marital status: Married    Spouse name: Not on file   Number of children: 4   Years of education: Not on file   Highest education level: Not on file  Occupational History   Occupation: Autobody Repairman  Tobacco Use   Smoking status: Former    Current packs/day: 0.00    Types: Cigarettes    Quit date: 08/09/1999    Years since quitting: 24.4   Smokeless tobacco: Never   Tobacco comments:    quit 9 years ago  Vaping Use   Vaping status: Never Used  Substance and Sexual Activity   Alcohol use: No   Drug use: No   Sexual activity: Not Currently  Other Topics Concern   Not on file  Social History Narrative   Married   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      4 kids + grandkids      Former smoker, no current tobacco alcohol or drug use      Social Drivers of Corporate investment banker Strain: Medium Risk (12/28/2023)   Overall Financial Resource Strain (CARDIA)    Difficulty of Paying Living Expenses: Somewhat hard  Food Insecurity: No Food Insecurity (12/28/2023)   Hunger Vital Sign    Worried About Running Out of Food in the Last Year: Never true    Ran Out of Food in the Last Year: Never true  Transportation Needs: No Transportation Needs (12/28/2023)   PRAPARE - Administrator, Civil Service (Medical): No    Lack of Transportation (Non-Medical): No  Physical Activity:  Sufficiently Active (12/28/2023)   Exercise Vital Sign    Days of Exercise per Week: 7 days    Minutes of Exercise per Session: 60 min  Stress: No Stress Concern Present (12/28/2023)   [employer] of Occupational Health - Occupational Stress Questionnaire    Feeling of Stress: Not at all  Social Connections: Socially Integrated (12/28/2023)   Social Connection and Isolation Panel    Frequency of Communication with Friends and Family: More than three times a week    Frequency of Social Gatherings with Friends and Family: More than three times a week  Attends Religious Services: More than 4 times per year    Active Member of Clubs or Organizations: Yes    Attends Engineer, structural: More than 4 times per year    Marital Status: Married    Tobacco Counseling Counseling given: No Tobacco comments: quit 9 years ago    Clinical Intake:  Pre-visit preparation completed: Yes  Pain : No/denies pain     BMI - recorded: 31.93 Nutritional Status: BMI > 30  Obese Nutritional Risks: None Diabetes: Yes CBG done?: No Did pt. bring in CBG monitor from home?: No  Lab Results  Component Value Date   HGBA1C 6.6 (H) 09/20/2023   HGBA1C 6.9 (H) 01/30/2023   HGBA1C 6.9 (H) 02/10/2022     How often do you need to have someone help you when you read instructions, pamphlets, or other written materials from your doctor or pharmacy?: 1 - Never  Interpreter Needed?: No  Information entered by :: Verdie Saba, CMA   Activities of Daily Living     12/28/2023    8:16 AM  In your present state of health, do you have any difficulty performing the following activities:  Hearing? 0  Vision? 0  Difficulty concentrating or making decisions? 0  Walking or climbing stairs? 0  Dressing or bathing? 0  Doing errands, shopping? 0  Preparing Food and eating ? N  Using the Toilet? N  In the past six months, have you accidently leaked urine? N  Do you have problems with loss of bowel  control? N  Managing your Medications? N  Managing your Finances? N  Housekeeping or managing your Housekeeping? N    Patient Care Team: Joshua Debby CROME, MD as PCP - General (Internal Medicine) Lavona Agent, MD as PCP - Cardiology (Cardiology) Regenia Prentice Clack, MD as Consulting Physician (Ophthalmology)  I have updated your Care Teams any recent Medical Services you may have received from other providers in the past year.     Assessment:   This is a routine wellness examination for [place].  Hearing/Vision screen Hearing Screening - Comments:: Denies hearing difficulties   Vision Screening - Comments:: Denies vision concerns   Goals Addressed               This Visit's Progress     Patient Stated (pt-stated)        Patient stated he plans to continue doing good       Depression Screen     12/28/2023    8:20 AM 09/20/2023    8:19 AM 12/22/2022    8:52 AM 01/21/2022    1:09 PM 09/09/2021   10:15 AM 03/23/2021   10:32 AM 01/11/2021   12:07 PM  PHQ 2/9 Scores  PHQ - 2 Score 0 0 0 0 0 0 0  PHQ- 9 Score 0  0        Fall Risk     12/28/2023    8:17 AM 09/20/2023    8:19 AM 12/22/2022    8:51 AM 01/21/2022    1:06 PM 09/09/2021   10:15 AM  Fall Risk   Falls in the past year? 0 0 0 0 0  Number falls in past yr: 0 0 0 0   Injury with Fall? 0 0 0 0   Risk for fall due to : No Fall Risks No Fall Risks No Fall Risks No Fall Risks   Follow up Falls evaluation completed;Falls prevention discussed Falls evaluation completed Falls prevention discussed Falls evaluation  completed       Data saved with a previous flowsheet row definition    MEDICARE RISK AT HOME:  Medicare Risk at Home Any stairs in or around the home?: Yes (outside) If so, are there any without handrails?: No Home free of loose throw rugs in walkways, pet beds, electrical cords, etc?: Yes Adequate lighting in your home to reduce risk of falls?: Yes Life alert?: No Use of a cane, walker or w/c?: No Grab  bars in the bathroom?: Yes Shower chair or bench in shower?: No Elevated toilet seat or a handicapped toilet?: Yes  TIMED UP AND GO:  Was the test performed?  No  Cognitive Function: 6CIT completed        12/28/2023    8:23 AM 12/22/2022    8:53 AM 01/21/2022    1:14 PM 12/09/2019   10:31 AM  6CIT Screen  What Year? 0 points 0 points 0 points 0 points  What month? 0 points 0 points 0 points 0 points  What time? 0 points 0 points 0 points 0 points  Count back from 20 0 points 0 points 0 points 0 points  Months in reverse 2 points 0 points 0 points 0 points  Repeat phrase 2 points 0 points 0 points 0 points  Total Score 4 points 0 points 0 points 0 points    Immunizations Immunization History  Administered Date(s) Administered   Fluad Quad(high Dose 65+) 05/28/2019   Moderna Sars-Covid-2 Vaccination 07/30/2019, 08/27/2019   Pneumococcal Conjugate-13 12/12/2013   Pneumococcal Polysaccharide-23 05/11/2016, 02/10/2022   Tdap 12/12/2013    Screening Tests Health Maintenance  Topic Date Due   Zoster Vaccines- Shingrix  (1 of 2) Never done   DTaP/Tdap/Td (2 - Td or Tdap) 12/13/2023   INFLUENZA VACCINE  01/26/2024   HEMOGLOBIN A1C  03/22/2024   OPHTHALMOLOGY EXAM  09/05/2024   Diabetic kidney evaluation - eGFR measurement  09/19/2024   Diabetic kidney evaluation - Urine ACR  09/19/2024   FOOT EXAM  09/19/2024   Medicare Annual Wellness (AWV)  12/27/2024   Pneumococcal Vaccine: 50+ Years  Completed   Hepatitis B Vaccines  Aged Out   HPV VACCINES  Aged Out   Meningococcal B Vaccine  Aged Out   Colonoscopy  Discontinued   COVID-19 Vaccine  Discontinued   Hepatitis C Screening  Discontinued    Health Maintenance  Health Maintenance Due  Topic Date Due   Zoster Vaccines- Shingrix  (1 of 2) Never done   DTaP/Tdap/Td (2 - Td or Tdap) 12/13/2023   Health Maintenance Items Addressed:  VBCI Referral for financial assistance w/medication (Xarelto )  Additional  Screening:  Vision Screening: Recommended annual ophthalmology exams for early detection of glaucoma and other disorders of the eye. Would you like a referral to an eye doctor? No    Dental Screening: Recommended annual dental exams for proper oral hygiene  Community Resource Referral / Chronic Care Management: CRR required this visit?  Yes - VBCI Referral to the Pharmacy for financial assistance w/medication (Xarelto )  CCM required this visit?  No   Plan:    I have personally reviewed and noted the following in the patient's chart:   Medical and social history Use of alcohol, tobacco or illicit drugs  Current medications and supplements including opioid prescriptions. Patient is not currently taking opioid prescriptions. Functional ability and status Nutritional status Physical activity Advanced directives List of other physicians Hospitalizations, surgeries, and ER visits in previous 12 months Vitals Screenings to include cognitive, depression,  and falls Referrals and appointments  In addition, I have reviewed and discussed with patient certain preventive protocols, quality metrics, and best practice recommendations. A written personalized care plan for preventive services as well as general preventive health recommendations were provided to patient.   Verdie CHRISTELLA Saba, CMA   12/28/2023   After Visit Summary: (Declined) Due to this being a telephonic visit, with patients personalized plan was offered to patient but patient Declined AVS at this time   Notes: Please refer to Routing Comments.VBCI Referral to the pharmacy for financial assistance w/medication (Xarelto )

## 2023-12-28 NOTE — Patient Instructions (Signed)
 Edward Pittman , Thank you for taking time out of your busy schedule to complete your Annual Wellness Visit with me. I enjoyed our conversation and look forward to speaking with you again next year. I, as well as your care team,  appreciate your ongoing commitment to your health goals. Please review the following plan we discussed and let me know if I can assist you in the future. Your Game plan/ To Do List    Referrals: If you haven't heard from the office you've been referred to, please reach out to them at the phone provided.  VBCI Referral for financial assistance w/medication (Xarelto ) Follow up Visits: Next Medicare AWV with our clinical staff: 12/31/2024 at 8am   Have you seen your provider in the last 6 months (3 months if uncontrolled diabetes)? Yes Next Office Visit with your provider: to be scheduled  Clinician Recommendations:  Aim for 30 minutes of exercise or brisk walking, 6-8 glasses of water, and 5 servings of fruits and vegetables each day. Educated and advised on getting the Tdap (Tetenus) and the Shingles vaccines.      This is a list of the screening recommended for you and due dates:  Health Maintenance  Topic Date Due   Zoster (Shingles) Vaccine (1 of 2) Never done   DTaP/Tdap/Td vaccine (2 - Td or Tdap) 12/13/2023   Flu Shot  01/26/2024   Hemoglobin A1C  03/22/2024   Eye exam for diabetics  09/05/2024   Yearly kidney function blood test for diabetes  09/19/2024   Yearly kidney health urinalysis for diabetes  09/19/2024   Complete foot exam   09/19/2024   Medicare Annual Wellness Visit  12/27/2024   Pneumococcal Vaccine for age over 70  Completed   Hepatitis B Vaccine  Aged Out   HPV Vaccine  Aged Out   Meningitis B Vaccine  Aged Out   Colon Cancer Screening  Discontinued   COVID-19 Vaccine  Discontinued   Hepatitis C Screening  Discontinued    Advanced directives: (Provided) Advance directive discussed with you today. I have provided a copy for you to complete  at home and have notarized. Once this is complete, please bring a copy in to our office so we can scan it into your chart.  Advance Care Planning is important because it:  [x]  Makes sure you receive the medical care that is consistent with your values, goals, and preferences  [x]  It provides guidance to your family and loved ones and reduces their decisional burden about whether or not they are making the right decisions based on your wishes.  Follow the link provided in your after visit summary or read over the paperwork we have mailed to you to help you started getting your Advance Directives in place. If you need assistance in completing these, please reach out to us  so that we can help you!

## 2024-01-05 ENCOUNTER — Telehealth: Payer: Self-pay | Admitting: *Deleted

## 2024-01-05 NOTE — Progress Notes (Signed)
 Care Guide Pharmacy Note  01/05/2024 Name: Edward Pittman MRN: [id number] DOB: 01-Jul-1940  Referred By: Joshua Debby CROME, MD Reason for referral: Complex Care Management (Outreach to schedule referral with pharmacist )   Edward Pittman is a 83 y.o. year old male who is a primary care patient of Joshua Debby CROME, MD.  Edward Pittman was referred to the pharmacist for assistance related to: Atrial Fibrillation  Successful contact was made with the patient to discuss pharmacy services including being ready for the pharmacist to call at least 5 minutes before the scheduled appointment time and to have medication bottles and any blood pressure readings ready for review. The patient agreed to meet with the pharmacist via telephone visit on 01/16/2024  Thedford Franks, CMA [place]  [facility] Guide Direct Dial: [phone]  Fax: [phone] Website: [email or web address]

## 2024-01-16 ENCOUNTER — Other Ambulatory Visit (INDEPENDENT_AMBULATORY_CARE_PROVIDER_SITE_OTHER)

## 2024-01-16 ENCOUNTER — Telehealth: Payer: Self-pay

## 2024-01-16 DIAGNOSIS — I482 Chronic atrial fibrillation, unspecified: Secondary | ICD-10-CM

## 2024-01-16 NOTE — Telephone Encounter (Signed)
 Fill and email pap [employer] and [employer] Xarelto  and email to [facility], pt is coming to office to sign.

## 2024-01-16 NOTE — Progress Notes (Signed)
   01/16/2024 Name: Edward Pittman MRN: [id number] DOB: 1940-07-02  Chief Complaint  Patient presents with   Medication Access    Edward Pittman is a 83 y.o. year old male who presented for a telephone visit.   They were referred to the pharmacist by their PCP for assistance in managing medication access.    Subjective:  Care Team: Primary Care Provider: Joshua Debby CROME, MD ; Next Scheduled Visit: none scheduled   Medication Access/Adherence  Current Pharmacy:  [id number] - [place], [place] - [address] AT [facility] [address] [place] [address] Phone: [phone] Fax: [phone]   Patient reports affordability concerns with their medications: Yes  Patient reports access/transportation concerns to their pharmacy: No  Patient reports adherence concerns with their medications:  No    He notes his Xarelto  was $591 for 90 DS when in the deductible and since has been $141 per 90 DS   He has been paying for Xarelto , however the deductible was difficult for him to afford and therefore trying to see if there is an option to avoid that large amount every year    Assessment/Plan:   Medication access: Pt should qualify for Xarelto  PAP through [employer]. They waive the 4% OOP requirement if income is between 30k and 50k. Will initiate application and leave at front desk for signature.  Follow Up Plan: PRN  Darrelyn Drum, PharmD, BCPS, CPP Clinical Pharmacist Practitioner [facility] [phone]

## 2024-01-25 NOTE — Telephone Encounter (Signed)
 Received approval letter from [facility] Xarelto ,pt has been approved for a year and will mail out to pt homes,approval letter was index. Pt is aware he will be receiving med trough mail at his home address.

## 2024-03-17 ENCOUNTER — Other Ambulatory Visit: Payer: Self-pay | Admitting: Internal Medicine

## 2024-03-17 DIAGNOSIS — I428 Other cardiomyopathies: Secondary | ICD-10-CM

## 2024-03-17 DIAGNOSIS — I10 Essential (primary) hypertension: Secondary | ICD-10-CM

## 2024-03-17 DIAGNOSIS — E785 Hyperlipidemia, unspecified: Secondary | ICD-10-CM

## 2024-03-17 DIAGNOSIS — I4819 Other persistent atrial fibrillation: Secondary | ICD-10-CM

## 2024-03-18 ENCOUNTER — Other Ambulatory Visit: Payer: Self-pay | Admitting: Internal Medicine

## 2024-03-18 DIAGNOSIS — I4819 Other persistent atrial fibrillation: Secondary | ICD-10-CM

## 2024-04-01 ENCOUNTER — Other Ambulatory Visit: Payer: Self-pay | Admitting: Internal Medicine

## 2024-04-01 DIAGNOSIS — I4819 Other persistent atrial fibrillation: Secondary | ICD-10-CM

## 2024-04-01 NOTE — Telephone Encounter (Signed)
 Copied from CRM [id number]. Topic: Clinical - Prescription Issue >> Apr 01, 2024 11:14 AM [place] E wrote: Reason for CRM: Patient stated he was supposed to be receiving his rivaroxaban  (XARELTO ) 20 MG TABS tablet through home delivery at the beginning of October and he has not received it yet. Relayed to patient that he might need an OV in order to receive more refills, but patient stated that he was seen in March and he thought it should have been all set to go. Please call patient at [phone] to clarify issue.

## 2024-04-02 ENCOUNTER — Telehealth: Payer: Self-pay | Admitting: Pharmacist

## 2024-04-02 NOTE — Progress Notes (Unsigned)
 Patient came to office today needing Xarelto . He gets from [facility] and notes he is almost out. Last message from [facility] team was that approval was received 7/31. Informed pt med doesn't come here but to his home. Will ask [facility] team to call [facility] to check on status of next refill.  Darrelyn Drum, PharmD, BCPS, CPP Clinical Pharmacist Practitioner [facility] [phone]

## 2024-04-03 ENCOUNTER — Telehealth: Payer: Self-pay

## 2024-04-03 NOTE — Telephone Encounter (Signed)
 Gave [facility] a call pt has not received a refill from them on Xarelto ,spoke with a representative at [facility] she explained pt call a few month back and asked not to send any medicine due to having extra at this time per representative pt needs to call and ask them for a refill. Gave pt a call spoke with pt's wife and they will call [facility] and [employer].

## 2024-04-29 DIAGNOSIS — H401132 Primary open-angle glaucoma, bilateral, moderate stage: Secondary | ICD-10-CM | POA: Diagnosis not present

## 2024-12-31 ENCOUNTER — Ambulatory Visit
